# Patient Record
Sex: Male | Born: 1950 | Race: White | Hispanic: No | Marital: Married | Smoking: Former smoker
Health system: Southern US, Community
[De-identification: ages and names within clinical notes are randomized; demographics above are authoritative.]

## PROBLEM LIST (undated history)

## (undated) DIAGNOSIS — C61 Malignant neoplasm of prostate: Secondary | ICD-10-CM

## (undated) DIAGNOSIS — I10 Essential (primary) hypertension: Secondary | ICD-10-CM

## (undated) DIAGNOSIS — N403 Nodular prostate with lower urinary tract symptoms: Secondary | ICD-10-CM

## (undated) DIAGNOSIS — Z973 Presence of spectacles and contact lenses: Secondary | ICD-10-CM

---
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
## 2011-10-26 LAB — HM COLONOSCOPY

## 2014-10-31 DIAGNOSIS — I10 Essential (primary) hypertension: Secondary | ICD-10-CM | POA: Diagnosis not present

## 2015-03-16 HISTORY — PX: COLONOSCOPY WITH PROPOFOL: SHX5780

## 2016-01-13 DIAGNOSIS — H2513 Age-related nuclear cataract, bilateral: Secondary | ICD-10-CM | POA: Diagnosis not present

## 2016-08-12 ENCOUNTER — Ambulatory Visit (INDEPENDENT_AMBULATORY_CARE_PROVIDER_SITE_OTHER): Payer: Medicare Other | Admitting: Podiatry

## 2016-08-12 ENCOUNTER — Encounter: Payer: Self-pay | Admitting: Podiatry

## 2016-08-12 DIAGNOSIS — L600 Ingrowing nail: Secondary | ICD-10-CM

## 2016-08-12 MED ORDER — NEOMYCIN-POLYMYXIN-HC 1 % OT SOLN: OTIC | 1 refills | Status: DC

## 2016-08-12 NOTE — Patient Instructions (Signed)

## 2016-08-12 NOTE — Progress Notes (Signed)
   Subjective:    Patient ID: Ethan [name], male    DOB: 01-Jul-1950, 66 y.o.   MRN: [id number]  HPI: He presents today with chief complaint of ingrown toenails to the hallux bilateral. He states they been present for several months red tight swollen and painful shoe gear.    Review of Systems  Musculoskeletal: Positive for arthralgias.  All other systems reviewed and are negative.      Objective:   Physical Exam: Vital signs are stable alert and oriented 3 pulses are strongly palpable. Neurologic sensorium is intact deep tendon reflexes are intact muscle strength is normal bilateral. Orthopedic evaluation of his result was distal to the ankle for range of motion without crepitation. Cutaneous evaluation of straight supple well-hydrated cutis sharply greater nail margins with granulomas to the tibial borders of the hallux bilateral left greater than right.        Assessment & Plan:  Ingrown nail paronychia abscess hallux bilateral.  Plan: Chemical matrixectomy was performed to the hallux bilateral. He tolerated this well a local anesthesia was administered. He was provided with a prescription for Cortisporin otic as well as both oral home-going instructions for care and soaking of his toe and application of the antibiotic solution twice daily after soaking. I will follow-up with him in 2 weeks.

## 2016-08-25 ENCOUNTER — Ambulatory Visit: Payer: Self-pay | Admitting: Podiatry

## 2016-08-25 ENCOUNTER — Ambulatory Visit (INDEPENDENT_AMBULATORY_CARE_PROVIDER_SITE_OTHER): Payer: Medicare Other | Admitting: Podiatry

## 2016-08-25 ENCOUNTER — Encounter: Payer: Self-pay | Admitting: Podiatry

## 2016-08-25 DIAGNOSIS — Z23 Encounter for immunization: Secondary | ICD-10-CM | POA: Diagnosis not present

## 2016-08-25 DIAGNOSIS — I10 Essential (primary) hypertension: Secondary | ICD-10-CM | POA: Diagnosis not present

## 2016-08-25 DIAGNOSIS — L600 Ingrowing nail: Secondary | ICD-10-CM

## 2016-08-25 NOTE — Progress Notes (Signed)
Subjective:    Patient ID: Ethan [name], male   DOB: 66 y.o.   MRN: [id number]   HPI patient states nails doing fine with slight thickening the second nail right    ROS      Objective:  Physical Exam doing well from ingrown correction bilateral with thickened second nail right     Assessment:    Continue soaking toe drainage is ended     Plan:     Discharge and less needs to be seen again we'll probably have the second nail removed at one point in future

## 2016-09-30 DIAGNOSIS — L218 Other seborrheic dermatitis: Secondary | ICD-10-CM | POA: Diagnosis not present

## 2016-09-30 DIAGNOSIS — L821 Other seborrheic keratosis: Secondary | ICD-10-CM | POA: Diagnosis not present

## 2016-11-05 DIAGNOSIS — I10 Essential (primary) hypertension: Secondary | ICD-10-CM | POA: Diagnosis not present

## 2016-12-23 DIAGNOSIS — Z23 Encounter for immunization: Secondary | ICD-10-CM | POA: Diagnosis not present

## 2017-08-23 DIAGNOSIS — N400 Enlarged prostate without lower urinary tract symptoms: Secondary | ICD-10-CM | POA: Diagnosis not present

## 2017-08-23 DIAGNOSIS — N403 Nodular prostate with lower urinary tract symptoms: Secondary | ICD-10-CM | POA: Diagnosis not present

## 2017-08-24 DIAGNOSIS — R972 Elevated prostate specific antigen [PSA]: Secondary | ICD-10-CM | POA: Diagnosis not present

## 2017-08-24 DIAGNOSIS — N403 Nodular prostate with lower urinary tract symptoms: Secondary | ICD-10-CM | POA: Diagnosis not present

## 2017-08-24 DIAGNOSIS — R350 Frequency of micturition: Secondary | ICD-10-CM | POA: Diagnosis not present

## 2017-08-25 ENCOUNTER — Other Ambulatory Visit: Payer: Self-pay | Admitting: Urology

## 2017-08-25 DIAGNOSIS — R972 Elevated prostate specific antigen [PSA]: Secondary | ICD-10-CM | POA: Diagnosis not present

## 2017-08-25 DIAGNOSIS — C61 Malignant neoplasm of prostate: Secondary | ICD-10-CM | POA: Diagnosis not present

## 2017-09-02 ENCOUNTER — Encounter (HOSPITAL_COMMUNITY)
Admission: RE | Admit: 2017-09-02 | Discharge: 2017-09-02 | Disposition: A | Discharge status: Home / Self Care | Payer: Medicare Other | Source: Ambulatory Visit | Attending: Urology | Admitting: Urology

## 2017-09-02 DIAGNOSIS — N400 Enlarged prostate without lower urinary tract symptoms: Secondary | ICD-10-CM | POA: Diagnosis not present

## 2017-09-02 DIAGNOSIS — R972 Elevated prostate specific antigen [PSA]: Secondary | ICD-10-CM | POA: Diagnosis not present

## 2017-09-02 DIAGNOSIS — C61 Malignant neoplasm of prostate: Secondary | ICD-10-CM | POA: Diagnosis not present

## 2017-09-02 MED ORDER — FLUDEOXYGLUCOSE F - 18 (FDG) INJECTION: 10.3200 | Freq: Once | INTRAVENOUS | Status: DC | PRN

## 2017-09-02 MED ORDER — TECHNETIUM TC 99M MEDRONATE IV KIT
20.0000 | PACK | Freq: Once | INTRAVENOUS | Status: AC | PRN
  Administered 2017-09-02: 21.4 via INTRAVENOUS

## 2017-09-07 ENCOUNTER — Other Ambulatory Visit: Payer: Self-pay

## 2017-09-07 ENCOUNTER — Encounter (HOSPITAL_BASED_OUTPATIENT_CLINIC_OR_DEPARTMENT_OTHER): Payer: Self-pay | Admitting: *Deleted

## 2017-09-07 ENCOUNTER — Other Ambulatory Visit: Payer: Self-pay | Admitting: Urology

## 2017-09-07 DIAGNOSIS — C7951 Secondary malignant neoplasm of bone: Secondary | ICD-10-CM | POA: Diagnosis not present

## 2017-09-07 DIAGNOSIS — R3912 Poor urinary stream: Secondary | ICD-10-CM | POA: Diagnosis not present

## 2017-09-07 DIAGNOSIS — R351 Nocturia: Secondary | ICD-10-CM | POA: Diagnosis not present

## 2017-09-07 DIAGNOSIS — C61 Malignant neoplasm of prostate: Secondary | ICD-10-CM | POA: Diagnosis not present

## 2017-09-07 DIAGNOSIS — N403 Nodular prostate with lower urinary tract symptoms: Secondary | ICD-10-CM | POA: Diagnosis not present

## 2017-09-07 DIAGNOSIS — C775 Secondary and unspecified malignant neoplasm of intrapelvic lymph nodes: Secondary | ICD-10-CM | POA: Diagnosis not present

## 2017-09-07 NOTE — H&P (Signed)
CC: My PSA is elevated above the normal range.  HPI: Ethan [name] is a 67 year-old male patient who was referred by Dr. Irine Seal, MD who is here for an elevated PSA.  His PSA is 542.   Ethan [name] is a 67 yo WM who is sent in consultation by Dr. Evette Georges for a nodular prostate with LUTS and a PSA of 542. He had a PSA about 5 years ago that was 2.21 on 10/25/11. He has had increased LUTS over the last 3-6 months. He has some intermittency, frequency, a reduced stream and nocturia. His IPSS is 13. He was given tamsulosin which he began last night. He has no longer has AM erections and has a 3-4 month history of reduced ejaculatory volume. He has otherwise normal erections. He lifted something heavy about 3-4 months ago and had acute pain into both legs and into the penis. The penile pain can resolve at times. He has had no weight loss or bone pain. He does have some muscular pain in the left leg that has been present for the last 6 months. He has no other GU history. He has no family history of prostate cancer. He has had no hematuria.      ALLERGIES: None   MEDICATIONS: Aspir 81  Flomax  Losartan Potassium     GU PSH: Prostate Needle Biopsy - 08/25/2017    NON-GU PSH: Appendectomy (laparoscopic) - 1964 Surgical Pathology, Gross And Microscopic Examination For Prostate Needle - 08/25/2017    GU PMH: Elevated PSA, His repeat PSA was 352 and his US findings are consistent with extracapsular disease. I will contact him with the results and have him return for a consultation with the results of his CT and Bonescan which are scheduled for 6/21. 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 - 08/25/2017 Hypertension    FAMILY HISTORY: 2 sons - Son Deceased - Mother, Father Dementia - Mother Kidney Failure - Father   SOCIAL HISTORY: Marital Status: Married Preferred Language: English; Ethnicity: Not Hispanic Or Latino; Race: White Current Smoking Status:  Patient does not smoke anymore. Has not smoked since 08/14/2006. Smoked for 25 years. Smoked 1 pack per day.   Tobacco Use Assessment Completed: Used Tobacco in last 30 days? Drinks 1 drink per day.  Drinks 1 caffeinated drink per day. Patient's occupation Lobbyist.    REVIEW OF SYSTEMS:    GU Review Male:   Patient reports frequent urination, get up at night to urinate, leakage of urine, stream starts and stops, and erection problems. Patient denies hard to postpone urination, burning/ pain with urination, trouble starting your stream, have to strain to urinate , and penile pain.  Gastrointestinal (Upper):   Patient denies nausea, vomiting, and indigestion/ heartburn.  Gastrointestinal (Lower):   Patient denies diarrhea and constipation.  Constitutional:   Patient denies fever, night sweats, weight loss, and fatigue.  Skin:   Patient denies skin rash/ lesion and itching.  Eyes:   Patient denies blurred vision and double vision.  Ears/ Nose/ Throat:   Patient denies sore throat and sinus problems.  Hematologic/Lymphatic:   Patient denies swollen glands and easy bruising.  Cardiovascular:   Patient denies leg swelling and chest pains.  Respiratory:   Patient denies cough and shortness of breath.  Endocrine:   Patient denies excessive thirst.  Musculoskeletal:   Patient denies back pain and joint pain.  Neurological:   Patient denies  dizziness and headaches.  Psychologic:   Patient denies depression and anxiety.   VITAL SIGNS:      08/24/2017 03:54 PM  Weight 204.5 lb / 92.76 kg  Height 70 in / 177.8 cm  BP 152/83 mmHg  Pulse 86 /min  Temperature 97.3 F / 36.2 C  BMI 29.3 kg/m   GU PHYSICAL EXAMINATION:    Anus and Perineum: No hemorrhoids. No anal stenosis. No rectal fissure, no anal fissure. No edema, no dimple, no perineal tenderness, no anal tenderness.  Scrotum: No lesions. No edema. No cysts. No warts.  Epididymides: Right: no spermatocele, no masses, no cysts,  no tenderness, no induration, no enlargement. Left: no spermatocele, no masses, no cysts, no tenderness, no induration, no enlargement.  Testes: No tenderness, no swelling, no enlargement left testes. No tenderness, no swelling, no enlargement right testes. Normal location left testes. Normal location right testes. No mass, no cyst, no varicocele, no hydrocele left testes. No mass, no cyst, no varicocele, no hydrocele right testes.  Urethral Meatus: Normal size. No lesion, no wart, no discharge, no polyp. Normal location.  Penis: Circumcised, no warts, no cracks. No dorsal Peyronie's plaques, no left corporal Peyronie's plaques, no right corporal Peyronie's plaques, no scarring, no warts. No balanitis, no meatal stenosis.  Prostate: Prostate 2 + size irregular and firm . There is a mass effect superior to the prostate that could be a full bladder on neoplasm.   Seminal Vesicles: Nonpalpable.  Sphincter Tone: Normal sphincter. No rectal tenderness. No rectal mass.    MULTI-SYSTEM PHYSICAL EXAMINATION:    Constitutional: Well-nourished. No physical deformities. Normally developed. Good grooming.  Neck: Neck symmetrical, not swollen. Normal tracheal position.  Respiratory: No labored breathing, no use of accessory muscles. Normal breath sounds.  Cardiovascular: Regular rate and rhythm. No murmur, no gallop. Normal temperature, normal extremity pulses, no swelling, no varicosities.  Lymphatic: No enlargement of neck, axillae, groin.  Skin: No paleness, no jaundice, no cyanosis. No lesion, no ulcer, no rash.  Neurologic / Psychiatric: Oriented to time, oriented to place, oriented to person. No depression, no anxiety, no agitation.  Gastrointestinal: Obese abdomen. No hernia. No mass, no tenderness, no rigidity.   Musculoskeletal: Normal gait and station of head and neck.     PAST DATA REVIEWED:  Source Of History:  Patient  Lab Test Review:   PSA, BMP  Records Review:   AUA Symptom Score, Previous  Doctor Records  Urine Test Review:   Urinalysis  Urodynamics Review:   Review Bladder Scan  Notes:                     I have reviewed his records from Dr. Zadie Rhine. Cr on 6/12 was 0.92 and his LFT's were normal.    PROCEDURES:           PVR Ultrasound - 75102  Scanned Volume: 67 cc         Urinalysis w/Scope - 81001 Dipstick Dipstick Cont'd Micro  Color: Yellow Bilirubin: Neg WBC/hpf: NS (Not Seen)  Appearance: Clear Ketones: Trace RBC/hpf: 0 - 2/hpf  Specific Gravity: 1.030 Blood: 1+ Bacteria: NS (Not Seen)  pH: 5.5 Protein: 1+ Cystals: NS (Not Seen)  Glucose: Neg Urobilinogen: 0.2 Casts: NS (Not Seen)    Nitrites: Neg Trichomonas: Not Present    Leukocyte Esterase: Neg Mucous: Present      Epithelial Cells: NS (Not Seen)      Yeast: NS (Not Seen)      Sperm: Not  Present    Notes:      ASSESSMENT:      ICD-10 Details  1 GU:   Elevated PSA - R97.20 He has a PSA of 543 with a nodular prostate with a mass superior to the prostate and LUTS. He most likely will be found to have metastatic or at least very locally advanced prostate cancer. I am going to get him back for a prostate [place] and biopsy tomorrow and reviewed the risks of bleeding, infection and voiding difficulty. I will also repeat a PSA today and get a testosterone level for baseline as he will likely need ADT. I have placed orders for a bone scan and CT pelvis.   2   Prostate nodule w/ LUTS - N40.3    PLAN:           Orders Labs PSA, Total Testosterone          Schedule X-Rays: Bone Scan - ASAP PSA of 542    C.T. Pelvis With I.V. Contrast - ASAP. He has a PSA of 542 with probable advanced prostate cancer.   Return Visit/Planned Activity: ASAP - TRUSP             Note: He has a PSA of 542 and I would like to squeeze in the biopsy tomorrow.   Procedure: Approximately 1 Day at [facility] [place]. - [phone] - TRUSP/BX (Prostate Needle Biopsy) - [place], [id number], [id number], [id number], [phone] Notes: Needs Biopsy ASAP.           Document Letter(s):  Created for Patient: Clinical Summary         Notes:   CC: Dr. Harlene Ramus.         Next Appointment:      Next Appointment: 09/02/2017 01:15 PM    Appointment Type: CT Scan    Location: [facility] [phone]    Provider: CT CT    Reason for Visit: ct p w/Malaiyah Achorn Elevated PSA     His biopsy showed extensive Gleason 9 prostate cancer and staging showed extensive bone and lymph node mets.   He has bone pain in the left leg and buttock.  After reviewing the options.   He has elected to proceed with bilateral orchiectomy as initial therapy.

## 2017-09-07 NOTE — Progress Notes (Signed)
Spoke w/ pt via phone for pre-op interview.  Npo after mn w/ exception clear liquids until 0645 (no cream/ milk products).  Arrive at 1045.  Needs istat 8 and ekg.

## 2017-09-08 ENCOUNTER — Other Ambulatory Visit: Payer: Self-pay

## 2017-09-08 ENCOUNTER — Encounter (HOSPITAL_BASED_OUTPATIENT_CLINIC_OR_DEPARTMENT_OTHER): Payer: Self-pay

## 2017-09-08 ENCOUNTER — Ambulatory Visit (HOSPITAL_BASED_OUTPATIENT_CLINIC_OR_DEPARTMENT_OTHER)
Admission: RE | Admit: 2017-09-08 | Discharge: 2017-09-08 | Disposition: A | Discharge status: Home / Self Care | Payer: Medicare Other | Source: Ambulatory Visit | Attending: Urology | Admitting: Urology

## 2017-09-08 ENCOUNTER — Encounter (HOSPITAL_BASED_OUTPATIENT_CLINIC_OR_DEPARTMENT_OTHER)
Admission: RE | Disposition: A | Discharge status: Home / Self Care | Payer: Self-pay | Source: Ambulatory Visit | Attending: Urology

## 2017-09-08 ENCOUNTER — Ambulatory Visit (HOSPITAL_BASED_OUTPATIENT_CLINIC_OR_DEPARTMENT_OTHER): Payer: Medicare Other | Admitting: Anesthesiology

## 2017-09-08 DIAGNOSIS — C779 Secondary and unspecified malignant neoplasm of lymph node, unspecified: Secondary | ICD-10-CM | POA: Insufficient documentation

## 2017-09-08 DIAGNOSIS — I10 Essential (primary) hypertension: Secondary | ICD-10-CM | POA: Diagnosis not present

## 2017-09-08 DIAGNOSIS — N4611 Organic oligospermia: Secondary | ICD-10-CM | POA: Diagnosis not present

## 2017-09-08 DIAGNOSIS — Z87891 Personal history of nicotine dependence: Secondary | ICD-10-CM | POA: Insufficient documentation

## 2017-09-08 DIAGNOSIS — Z79899 Other long term (current) drug therapy: Secondary | ICD-10-CM | POA: Diagnosis not present

## 2017-09-08 DIAGNOSIS — C7951 Secondary malignant neoplasm of bone: Secondary | ICD-10-CM | POA: Diagnosis not present

## 2017-09-08 DIAGNOSIS — Z7982 Long term (current) use of aspirin: Secondary | ICD-10-CM | POA: Diagnosis not present

## 2017-09-08 DIAGNOSIS — C61 Malignant neoplasm of prostate: Secondary | ICD-10-CM | POA: Diagnosis not present

## 2017-09-08 HISTORY — DX: Malignant neoplasm of prostate: C61

## 2017-09-08 HISTORY — DX: Essential (primary) hypertension: I10

## 2017-09-08 HISTORY — PX: ORCHIECTOMY: SHX2116

## 2017-09-08 HISTORY — DX: Presence of spectacles and contact lenses: Z97.3

## 2017-09-08 HISTORY — DX: Nodular prostate with lower urinary tract symptoms: N40.3

## 2017-09-08 LAB — POCT I-STAT, CHEM 8
BUN: 20 mg/dL (ref 8–23)
CREATININE: 0.7 mg/dL (ref 0.61–1.24)
Calcium, Ion: 1.19 mmol/L (ref 1.15–1.40)
Chloride: 101 mmol/L (ref 98–111)
Glucose, Bld: 109 mg/dL — ABNORMAL HIGH (ref 70–99)
HEMATOCRIT: 42 % (ref 39.0–52.0)
Hemoglobin: 14.3 g/dL (ref 13.0–17.0)
POTASSIUM: 3.6 mmol/L (ref 3.5–5.1)
Sodium: 140 mmol/L (ref 135–145)
TCO2: 24 mmol/L (ref 22–32)

## 2017-09-08 SURGERY — ORCHIECTOMY
Anesthesia: General | Laterality: Bilateral

## 2017-09-08 MED ORDER — LIDOCAINE 2% (20 MG/ML) 5 ML SYRINGE
INTRAMUSCULAR | Status: DC | PRN
  Administered 2017-09-08: 75 mg via INTRAVENOUS

## 2017-09-08 MED ORDER — CELECOXIB 200 MG PO CAPS
ORAL_CAPSULE | ORAL | Status: AC
  Filled 2017-09-08: qty 1

## 2017-09-08 MED ORDER — FENTANYL CITRATE (PF) 100 MCG/2ML IJ SOLN
INTRAMUSCULAR | Status: AC
  Filled 2017-09-08: qty 2

## 2017-09-08 MED ORDER — BUPIVACAINE HCL (PF) 0.25 % IJ SOLN
INTRAMUSCULAR | Status: DC | PRN
  Administered 2017-09-08: 17 mL

## 2017-09-08 MED ORDER — OXYCODONE HCL 5 MG PO TABS
5.0000 mg | ORAL_TABLET | Freq: Once | ORAL | Status: DC | PRN
  Filled 2017-09-08: qty 1

## 2017-09-08 MED ORDER — HYDROMORPHONE HCL 1 MG/ML IJ SOLN
0.2500 mg | INTRAMUSCULAR | Status: DC | PRN
  Filled 2017-09-08: qty 0.5

## 2017-09-08 MED ORDER — SODIUM CHLORIDE 0.9% FLUSH
3.0000 mL | INTRAVENOUS | Status: DC | PRN
  Filled 2017-09-08: qty 3

## 2017-09-08 MED ORDER — SODIUM CHLORIDE 0.9 % IR SOLN
Status: DC | PRN
  Administered 2017-09-08: 500 mL

## 2017-09-08 MED ORDER — OXYCODONE HCL 5 MG PO TABS
5.0000 mg | ORAL_TABLET | ORAL | Status: DC | PRN
  Filled 2017-09-08: qty 2

## 2017-09-08 MED ORDER — MORPHINE SULFATE (PF) 2 MG/ML IV SOLN
2.0000 mg | INTRAVENOUS | Status: DC | PRN
  Filled 2017-09-08: qty 1

## 2017-09-08 MED ORDER — ACETAMINOPHEN 325 MG PO TABS
650.0000 mg | ORAL_TABLET | ORAL | Status: DC | PRN
  Filled 2017-09-08: qty 2

## 2017-09-08 MED ORDER — SODIUM CHLORIDE 0.9% FLUSH
3.0000 mL | Freq: Two times a day (BID) | INTRAVENOUS | Status: DC
  Filled 2017-09-08: qty 3

## 2017-09-08 MED ORDER — FENTANYL CITRATE (PF) 100 MCG/2ML IJ SOLN
INTRAMUSCULAR | Status: DC | PRN
  Administered 2017-09-08 (×2): 50 ug via INTRAVENOUS

## 2017-09-08 MED ORDER — HYDROCODONE-ACETAMINOPHEN 5-325 MG PO TABS: 1.0000 | ORAL_TABLET | Freq: Four times a day (QID) | ORAL | 0 refills | Status: DC | PRN

## 2017-09-08 MED ORDER — PROPOFOL 10 MG/ML IV BOLUS
INTRAVENOUS | Status: AC
  Filled 2017-09-08: qty 40

## 2017-09-08 MED ORDER — OXYCODONE HCL 5 MG/5ML PO SOLN
5.0000 mg | Freq: Once | ORAL | Status: DC | PRN
  Filled 2017-09-08: qty 5

## 2017-09-08 MED ORDER — MIDAZOLAM HCL 2 MG/2ML IJ SOLN
INTRAMUSCULAR | Status: AC
  Filled 2017-09-08: qty 2

## 2017-09-08 MED ORDER — ACETAMINOPHEN 160 MG/5ML PO SOLN
960.0000 mg | Freq: Once | ORAL | Status: AC
  Filled 2017-09-08: qty 40.6

## 2017-09-08 MED ORDER — DEXAMETHASONE SODIUM PHOSPHATE 4 MG/ML IJ SOLN
INTRAMUSCULAR | Status: DC | PRN
  Administered 2017-09-08: 10 mg via INTRAVENOUS

## 2017-09-08 MED ORDER — LACTATED RINGERS IV SOLN
INTRAVENOUS | Status: DC
  Administered 2017-09-08: 11:00:00 via INTRAVENOUS
  Administered 2017-09-08: 1000 mL via INTRAVENOUS
  Filled 2017-09-08: qty 1000

## 2017-09-08 MED ORDER — ACETAMINOPHEN 500 MG PO TABS
1000.0000 mg | ORAL_TABLET | Freq: Once | ORAL | Status: AC
  Administered 2017-09-08: 1000 mg via ORAL
  Filled 2017-09-08: qty 2

## 2017-09-08 MED ORDER — CELECOXIB 200 MG PO CAPS
200.0000 mg | ORAL_CAPSULE | Freq: Once | ORAL | Status: AC | PRN
  Administered 2017-09-08: 200 mg via ORAL
  Filled 2017-09-08: qty 1

## 2017-09-08 MED ORDER — ACETAMINOPHEN 500 MG PO TABS
ORAL_TABLET | ORAL | Status: AC
  Filled 2017-09-08: qty 2

## 2017-09-08 MED ORDER — ONDANSETRON HCL 4 MG/2ML IJ SOLN
INTRAMUSCULAR | Status: DC | PRN
  Administered 2017-09-08: 4 mg via INTRAVENOUS

## 2017-09-08 MED ORDER — MIDAZOLAM HCL 5 MG/5ML IJ SOLN
INTRAMUSCULAR | Status: DC | PRN
  Administered 2017-09-08: 2 mg via INTRAVENOUS

## 2017-09-08 MED ORDER — PROPOFOL 10 MG/ML IV BOLUS
INTRAVENOUS | Status: DC | PRN
  Administered 2017-09-08: 200 mg via INTRAVENOUS

## 2017-09-08 MED ORDER — CEFAZOLIN SODIUM-DEXTROSE 2-4 GM/100ML-% IV SOLN
2.0000 g | INTRAVENOUS | Status: AC
  Administered 2017-09-08: 2 g via INTRAVENOUS
  Filled 2017-09-08: qty 100

## 2017-09-08 MED ORDER — ACETAMINOPHEN 650 MG RE SUPP
650.0000 mg | RECTAL | Status: DC | PRN
  Filled 2017-09-08: qty 1

## 2017-09-08 MED ORDER — CEFAZOLIN SODIUM-DEXTROSE 2-4 GM/100ML-% IV SOLN
INTRAVENOUS | Status: AC
  Filled 2017-09-08: qty 100

## 2017-09-08 MED ORDER — SODIUM CHLORIDE 0.9 % IV SOLN
250.0000 mL | INTRAVENOUS | Status: DC | PRN
  Filled 2017-09-08: qty 250

## 2017-09-08 SURGICAL SUPPLY — 47 items
APPLICATOR COTTON TIP 6IN STRL (MISCELLANEOUS) IMPLANT
BLADE CLIPPER SURG (BLADE) ×3 IMPLANT
BLADE SURG 15 STRL LF DISP TIS (BLADE) ×1 IMPLANT
BLADE SURG 15 STRL SS (BLADE) ×2
BNDG GAUZE ELAST 4 BULKY (GAUZE/BANDAGES/DRESSINGS) ×3 IMPLANT
CANISTER SUCTION 1200CC (MISCELLANEOUS) ×3 IMPLANT
CLEANER CAUTERY TIP 5X5 PAD (MISCELLANEOUS) ×1 IMPLANT
COVER BACK TABLE 60X90IN (DRAPES) ×3 IMPLANT
COVER MAYO STAND STRL (DRAPES) ×3 IMPLANT
DERMABOND ADVANCED (GAUZE/BANDAGES/DRESSINGS) ×2
DERMABOND ADVANCED .7 DNX12 (GAUZE/BANDAGES/DRESSINGS) ×1 IMPLANT
DISSECTOR ROUND CHERRY 3/8 STR (MISCELLANEOUS) IMPLANT
DRAIN PENROSE 18X1/4 LTX STRL (WOUND CARE) IMPLANT
DRAPE LAPAROTOMY TRNSV 102X78 (DRAPE) ×3 IMPLANT
DRSG TEGADERM 4X4.75 (GAUZE/BANDAGES/DRESSINGS) IMPLANT
ELECT NEEDLE TIP 2.8 STRL (NEEDLE) IMPLANT
ELECT REM PT RETURN 9FT ADLT (ELECTROSURGICAL) ×3
ELECTRODE REM PT RTRN 9FT ADLT (ELECTROSURGICAL) ×1 IMPLANT
GAUZE SPONGE 4X4 12PLY STRL (GAUZE/BANDAGES/DRESSINGS) ×3 IMPLANT
GLOVE SURG SS PI 8.0 STRL IVOR (GLOVE) ×3 IMPLANT
GOWN STRL REUS W/TWL LRG LVL3 (GOWN DISPOSABLE) IMPLANT
GOWN STRL REUS W/TWL XL LVL3 (GOWN DISPOSABLE) ×3 IMPLANT
KIT TURNOVER CYSTO (KITS) ×3 IMPLANT
NEEDLE HYPO 22GX1.5 SAFETY (NEEDLE) ×3 IMPLANT
NS IRRIG 500ML POUR BTL (IV SOLUTION) IMPLANT
PACK BASIN DAY SURGERY FS (CUSTOM PROCEDURE TRAY) ×3 IMPLANT
PAD CLEANER CAUTERY TIP 5X5 (MISCELLANEOUS) ×2
PENCIL BUTTON HOLSTER BLD 10FT (ELECTRODE) ×3 IMPLANT
SET COLLECT BLD 21X3/4 12 (NEEDLE) IMPLANT
SUPPORT SCROTAL LG STRP (MISCELLANEOUS) IMPLANT
SUPPORTER ATHLETIC LG (MISCELLANEOUS)
SUT CHROMIC 3 0 SH 27 (SUTURE) ×3 IMPLANT
SUT CHROMIC GUT AB #0 18 (SUTURE) IMPLANT
SUT SILK 3 0 TIES 17X18 (SUTURE)
SUT SILK 3-0 18XBRD TIE BLK (SUTURE) IMPLANT
SUT VICRYL 0 TIES 12 18 (SUTURE) ×3 IMPLANT
SUT VICRYL 2 0 18  UND BR (SUTURE)
SUT VICRYL 2 0 18 UND BR (SUTURE) IMPLANT
SYR 20CC LL (SYRINGE) ×3 IMPLANT
SYR BULB IRRIGATION 50ML (SYRINGE) ×3 IMPLANT
SYR CONTROL 10ML LL (SYRINGE) ×3 IMPLANT
TOWEL OR 17X24 6PK STRL BLUE (TOWEL DISPOSABLE) ×6 IMPLANT
TRAY DSU PREP LF (CUSTOM PROCEDURE TRAY) ×3 IMPLANT
TUBE CONNECTING 12'X1/4 (SUCTIONS) ×1
TUBE CONNECTING 12X1/4 (SUCTIONS) ×2 IMPLANT
WATER STERILE IRR 500ML POUR (IV SOLUTION) IMPLANT
YANKAUER SUCT BULB TIP NO VENT (SUCTIONS) ×3 IMPLANT

## 2017-09-08 NOTE — Discharge Instructions (Addendum)
HOME CARE INSTRUCTIONS FOR SCROTAL PROCEDURES  Wound Care & Hygiene: You may apply an ice bag to the scrotum for the first 24 hours.  This may help decrease swelling and soreness.  You may have a dressing held in place by an athletic supporter.  You may remove the dressing in 24 hours and shower in 48 hours.  Continue to use the athletic supporter or tight briefs for at least a week. Activity: Rest today - not necessarily flat bed rest.  Just take it easy.  You should not do strenuous activities until your follow-up visit with your doctor.  You may resume light activity in 48 hours.  Return to Work:  Your doctor will advise you of this depending on the type of work you do  Diet: Drink liquids or eat a light diet this evening.  You may resume a regular diet tomorrow.  General Expectations: You may have a small amount of bleeding.  The scrotum may be swollen or bruised for about a week.  Call your Doctor if these occur:  -persistent or heavy bleeding  -temperature of 101 degrees or more  -severe pain, not relieved by your pain medication  You may use and ice pack wrapped in a towel for 4-6 hours today to reduce swelling.  Please begin taking Caltrate + D  1200mg  daily.    Do not take any Tylenol or nonsteroidal anti inflammatories (Ibuprofen, Aleve, Motrin) until after 5:24 pm today.  We will arrange your follow up appointment and we are setting up a consultation with the medical oncologist.    Patient Signature:  __________________________________________________  Nurse's Signature:  __________________________________________________     Post Anesthesia Home Care Instructions  Activity: Get plenty of rest for the remainder of the day. A responsible individual must stay with you for 24 hours following the procedure.  For the next 24 hours, DO NOT: -Drive a car -Paediatric nurse -Drink alcoholic beverages -Take any medication unless instructed by your physician -Make any  legal decisions or sign important papers.  Meals: Start with liquid foods such as gelatin or soup. Progress to regular foods as tolerated. Avoid greasy, spicy, heavy foods. If nausea and/or vomiting occur, drink only clear liquids until the nausea and/or vomiting subsides. Call your physician if vomiting continues.  Special Instructions/Symptoms: Your throat may feel dry or sore from the anesthesia or the breathing tube placed in your throat during surgery. If this causes discomfort, gargle with warm salt water. The discomfort should disappear within 24 hours.

## 2017-09-08 NOTE — Transfer of Care (Signed)
Immediate Anesthesia Transfer of Care Note  Patient: Ethan [name]  Procedure(s) Performed: ORCHIECTOMY (Bilateral )  Patient Location: PACU  Anesthesia Type:General  Level of Consciousness: awake, alert , oriented and patient cooperative  Airway & Oxygen Therapy: Patient Spontanous Breathing and Patient connected to nasal cannula oxygen  Post-op Assessment: Report given to RN and Post -op Vital signs reviewed and stable  Post vital signs: Reviewed and stable  Last Vitals:  Vitals Value Taken Time  BP    Temp 36.8 C 09/08/2017  1:51 PM  Pulse 78 09/08/2017  1:53 PM  Resp 7 09/08/2017  1:53 PM  SpO2 99 % 09/08/2017  1:53 PM  Vitals shown include unvalidated device data.  Last Pain:  Vitals:   09/08/17 1108  TempSrc:   PainSc: 0-No pain      Patients Stated Pain Goal: 7 (19/16/60 6004)  Complications: No apparent anesthesia complications

## 2017-09-08 NOTE — Anesthesia Postprocedure Evaluation (Signed)
Anesthesia Post Note  Patient: Ethan [name]  Procedure(s) Performed: ORCHIECTOMY (Bilateral )     Patient location during evaluation: PACU Anesthesia Type: General Level of consciousness: awake and alert Pain management: pain level controlled Vital Signs Assessment: post-procedure vital signs reviewed and stable Respiratory status: spontaneous breathing, nonlabored ventilation, respiratory function stable and patient connected to nasal cannula oxygen Cardiovascular status: blood pressure returned to baseline and stable Postop Assessment: no apparent nausea or vomiting Anesthetic complications: no    Last Vitals:  Vitals:   09/08/17 1430 09/08/17 1535  BP:  131/78  Pulse: 72 75  Resp: 11 14  Temp:  36.8 C  SpO2: 95% 98%    Last Pain:  Vitals:   09/08/17 1535  TempSrc: Oral  PainSc: 0-No pain                 Ryan P Ellender

## 2017-09-08 NOTE — Anesthesia Procedure Notes (Signed)
Procedure Name: LMA Insertion Date/Time: 09/08/2017 12:56 PM Performed by: Wanita Chamberlain, CRNA Pre-anesthesia Checklist: Timeout performed, Patient being monitored, Suction available, Emergency Drugs available and Patient identified Patient Re-evaluated:Patient Re-evaluated prior to induction Oxygen Delivery Method: Circle system utilized Preoxygenation: Pre-oxygenation with 100% oxygen Induction Type: IV induction Ventilation: Mask ventilation without difficulty LMA: LMA inserted LMA Size: 4.0 Number of attempts: 1 Airway Equipment and Method: Bite block Placement Confirmation: breath sounds checked- equal and bilateral,  CO2 detector and positive ETCO2 Tube secured with: Tape Dental Injury: Teeth and Oropharynx as per pre-operative assessment

## 2017-09-08 NOTE — Op Note (Signed)
Procedure: Bilateral scrotal orchiectomy.  Preop diagnosis: Metastatic prostate cancer.  Postop diagnosis: Same.  Surgeon: Dr. Irine Seal.  Anesthesia: General and local.  Specimen: Right and left testes for gross only.  Drains: None.  EBL: Minimal.  Complications: None.  Indications: Ethan [name] is a 67 year old male who was recently diagnosed with a Gleason's 9 metastatic prostate cancer with both bone and lymph node involvement along with locally advanced disease and a presenting PSA of 542.  He has bone pain in the left hip and upper leg from metastases.  After review of the options he is elected to proceed with bilateral orchiectomy for androgen deprivation.  Procedure: He was taken operating room where he was given 2 g of Ancef.  A general anesthetic was induced with him in the supine position.  He was fitted with PAS hose.  His scrotum was clipped and he was prepped with Betadine solution and draped in the usual sterile fashion.  A midline anterior scrotal incision was made with a knife and this was then carried down through the dartos using the Bovie.  The right testicle was delivered from the wound and the cord was skeletonized.  A cord block with 5 mL's of quarter percent Marcaine was performed.  The cord was then divided into 2 packets, each of which was divided removing the testicle and then each packet was doubly ligated with 0 Vicryl ties.  This procedure was then repeated in identical fashion on the left.  The wound was then inspected for hemostasis and no active bleeding was identified.  Scrotal incision was injected with 7 additional mL's of quarter percent Marcaine.  The dark toe was closed using a running 3-0 chromic suture with care taken to include the median septum.  The skin was then closed using a running vertical mattress 3-0 chromic suture.  The wound was cleansed and a dressing of 4 x 4's, fluffs Curlex and a scrotal support was placed.  His anesthetic was reversed and he  was moved to recovery room in stable condition.  There were no complications.  A total of 17 mL of quarter percent Marcaine were used.

## 2017-09-08 NOTE — Interval H&P Note (Signed)
History and Physical Interval Note:  09/08/2017 11:24 AM  Ethan [name]  has presented today for surgery, with the diagnosis of PROSTATE CANCER  The various methods of treatment have been discussed with the patient and family. After consideration of risks, benefits and other options for treatment, the patient has consented to  Procedure(s): ORCHIECTOMY (Bilateral) as a surgical intervention .  The patient's history has been reviewed, patient examined, no change in status, stable for surgery.  I have reviewed the patient's chart and labs.  Questions were answered to the patient's satisfaction.     Irine Seal

## 2017-09-08 NOTE — Anesthesia Preprocedure Evaluation (Addendum)
Anesthesia Evaluation  Patient identified by MRN, date of birth, ID band Patient awake    Reviewed: Allergy & Precautions, NPO status , Patient's Chart, lab work & pertinent test results  Airway Mallampati: II  TM Distance: >3 FB Neck ROM: Full    Dental no notable dental hx. (+) Teeth Intact, Dental Advisory Given   Pulmonary former smoker,    Pulmonary exam normal breath sounds clear to auscultation       Cardiovascular hypertension, Pt. on medications Normal cardiovascular exam Rhythm:Regular Rate:Normal  ECG: NSR, rate 72   Neuro/Psych negative neurological ROS  negative psych ROS   GI/Hepatic negative GI ROS, Neg liver ROS,   Endo/Other  negative endocrine ROS  Renal/GU negative Renal ROS     Musculoskeletal negative musculoskeletal ROS (+)   Abdominal   Peds  Hematology negative hematology ROS (+)   Anesthesia Other Findings PROSTATE CANCER  Reproductive/Obstetrics                          Anesthesia Physical Anesthesia Plan  ASA: II  Anesthesia Plan: General   Post-op Pain Management:    Induction: Intravenous  PONV Risk Score and Plan: 2 and Ondansetron, Dexamethasone, Midazolam and Treatment may vary due to age or medical condition  Airway Management Planned: LMA  Additional Equipment:   Intra-op Plan:   Post-operative Plan: Extubation in OR  Informed Consent: I have reviewed the patients History and Physical, chart, labs and discussed the procedure including the risks, benefits and alternatives for the proposed anesthesia with the patient or authorized representative who has indicated his/her understanding and acceptance.   Dental advisory given  Plan Discussed with: CRNA  Anesthesia Plan Comments:         Anesthesia Quick Evaluation

## 2017-09-09 ENCOUNTER — Encounter (HOSPITAL_BASED_OUTPATIENT_CLINIC_OR_DEPARTMENT_OTHER): Payer: Self-pay | Admitting: Urology

## 2017-09-14 DIAGNOSIS — C61 Malignant neoplasm of prostate: Secondary | ICD-10-CM | POA: Diagnosis not present

## 2017-09-19 ENCOUNTER — Telehealth: Payer: Self-pay | Admitting: Oncology

## 2017-09-19 NOTE — Telephone Encounter (Signed)
New referral received from Dr. Jeffie Pollock at [facility] for metastatic prostate cancer. Pt has been scheduled to see Dr. Alen Blew on 7/12 at 2pm. Pt aware to arrive 30 minutes early.

## 2017-09-20 ENCOUNTER — Telehealth: Payer: Self-pay

## 2017-09-20 NOTE — Telephone Encounter (Signed)
Received VM from pt requesting if he should come in for new patient paperwork to be completed prior to appt on Friday. Ethan [name], New Patient Scheduler to inquire. Pt does not need to fill out anything ahead of time, but should just come in 30 minutes prior to appt time on Friday to complete Registration. Called pt back who verbalized understanding of instructions.

## 2017-09-23 ENCOUNTER — Telehealth: Payer: Self-pay

## 2017-09-23 ENCOUNTER — Telehealth: Payer: Self-pay | Admitting: Pharmacist

## 2017-09-23 ENCOUNTER — Encounter: Payer: Self-pay | Admitting: Medical Oncology

## 2017-09-23 ENCOUNTER — Inpatient Hospital Stay: Payer: Medicare Other | Attending: Oncology | Admitting: Oncology

## 2017-09-23 ENCOUNTER — Encounter: Payer: Self-pay | Admitting: Oncology

## 2017-09-23 VITALS — BP 119/48 | HR 79 | Temp 98.4°F | Resp 18 | Ht 70.0 in | Wt 197.6 lb

## 2017-09-23 DIAGNOSIS — C7951 Secondary malignant neoplasm of bone: Secondary | ICD-10-CM | POA: Insufficient documentation

## 2017-09-23 DIAGNOSIS — Z9079 Acquired absence of other genital organ(s): Secondary | ICD-10-CM | POA: Diagnosis not present

## 2017-09-23 DIAGNOSIS — C61 Malignant neoplasm of prostate: Secondary | ICD-10-CM | POA: Insufficient documentation

## 2017-09-23 DIAGNOSIS — Z79899 Other long term (current) drug therapy: Secondary | ICD-10-CM | POA: Diagnosis not present

## 2017-09-23 DIAGNOSIS — I10 Essential (primary) hypertension: Secondary | ICD-10-CM | POA: Insufficient documentation

## 2017-09-23 MED ORDER — ABIRATERONE ACETATE 250 MG PO TABS: 1000.0000 mg | ORAL_TABLET | Freq: Every day | ORAL | 0 refills | Status: DC

## 2017-09-23 MED ORDER — PREDNISONE 5 MG PO TABS: 5.0000 mg | ORAL_TABLET | Freq: Every day | ORAL | 3 refills | Status: DC

## 2017-09-23 NOTE — Telephone Encounter (Signed)
Oral Oncology Pharmacist Encounter  Received new prescription for Zytiga (abiraterone) for the treatment of newly-diagnosed, metastatic, castration-sensitive prostate cancer in conjunction with prednisone, planned duration until disease progression or unacceptable ocxicity. S/p bilateral orchiectomy on 09/08/17 as form of androgen deprivation  Labs from Epic assessed, OK fr treatment. BP will be monitored, now WNL  Current medication list in Epic reviewed, no significant DDIs with Zytiga identified.  Prescription has been e-scribed to the [facility] for benefits analysis and approval.  Oral Oncology Clinic will continue to follow for insurance authorization, copayment issues, initial counseling and start date.  [name], PharmD, BCPS, BCOP  09/23/2017 3:18 PM Oral Oncology Clinic [phone]

## 2017-09-23 NOTE — Progress Notes (Signed)
Introduced myself to [name] and his wife as the prostate nurse navigator and my role. Dr. Shadad has prescribed Zytiga and he was provided with patient drug information. Jessie-oral chemo pharmacist met with him to discuss insurance coverage and discuss the drug. I gave them my business card and asked them to call with questions or concerns. 

## 2017-09-23 NOTE — Telephone Encounter (Signed)
Oral Chemotherapy Pharmacist Encounter  Successfully enrolled patient for copayment assistance funds from Patient [name] from the [place] amount: $7500 Effective dates: 06/25/17 - 09/23/18 ID: [id number] BIN: [id number] Group: [id number] PCN: [id number]  Billing information will be shared with dispensing pharmacy.  [name], PharmD, BCPS, BCOP 09/23/2017 3:31 PM Oral Oncology Clinic [phone]

## 2017-09-23 NOTE — Telephone Encounter (Signed)
Oral Chemotherapy Pharmacist Encounter   I spoke with patient and wife, Margarita Grizzle, in scheduling area for overview of: Zytiga.   Counseled patient on administration, dosing, side effects, monitoring, drug-food interactions, safe handling, storage, and disposal.  Patient will take Zytiga 250mg  tablets, 4 tablets (1000mg ) by mouth once daily on an empty stomach, 1 hour before or 2 hours after a meal.  Patient states he will take his Zyitga 1st thing in the morning and will wait at least 1 hour before eating.  Patient will take prednisone 5mg  tablet, 1 tablet by mouth one daily with breakfast.  Zytiga start date: TBD, pending medication acquisition  Adverse effects include but are not limited to: peripheral edema, GI upset, hypertension, hot flashes, fatigue, and arthralgias.    Prednisone prescription has been sent to [facility] on [address]. Patient will obtain prednisone and knows to start prednisone on the same day as Zytiga start.  Reviewed with patient importance of keeping a medication schedule and plan for any missed doses.  Mr. and Mrs. voiced understanding and appreciation.   All questions answered. Medication reconciliation performed and medication/allergy list updated.  We extensively discussed medicare copayment and possibility for high copayment. We discussed options for copayment assistance including copayment grant foundations and manufacturer compassionate use program. Patient shared income information for grant foundation screening.  We will follow-up with patient about insurance authorization, copayment, foundation assistance, and medication acquisition.  Patient knows to call the office with questions or concerns.  Oral Oncology Clinic will continue to follow.  Thank you,  [name], PharmD, BCPS, BCOP  09/23/2017 3:19 PM Oral Oncology Clinic [phone]

## 2017-09-23 NOTE — Telephone Encounter (Signed)
Oral Oncology Patient Advocate Encounter  Received notification from [facility]  that prior authorization for Ethan [name] is required.  PA submitted on CoverMyMeds Key [id number] Status is pending  Oral Oncology Clinic will continue to follow.  [place] Patient [place] Phone [phone] Fax [phone]

## 2017-09-23 NOTE — Telephone Encounter (Signed)
Printed avs and calender of upcoming appointment. Ethan D. Okayed injection to be before provider visit. Per 7/12 los

## 2017-09-23 NOTE — Progress Notes (Signed)
Reason for the request:   Prostate cancer  HPI: I was asked by Dr. Jeffie Pollock to evaluate Ethan [name] for prostate cancer.  He is a pleasant 67 year old man currently of [place].  He has history of hypertension without any significant comorbid conditions.  He was referred to Dr. Jeffie Pollock by his primary care provider after noticing increase in his lower urinary tract symptoms including frequency and reduced stream and nocturia.  He was also noted to have an elevated PSA of 542.  He was evaluated by Dr. Jeffie Pollock and a repeat PSA on 08/24/2017 was 352.  He underwent prostate biopsy obtained on August 25, 2017 showed high volume high-grade disease with a Gleason score 4+5 = 9 and multiple cores with a total of at least 11 out of 12 cores involved with cancer.  CT scan of the abdomen and pelvis obtained on 09/14/2017 confirmed the presence of sclerotic lesions in the ribs, T5 vertebral body and L4 vertebral body.  He also has suspected pathological fracture involving T5 vertebral body and inferior endplate of T4.  He had scattered retroperitoneal lymph nodes noted with enlargement of the right common iliac lymph node.  He subsequently underwent a bilateral orchiectomy obtained on 09/08/2017.  His lower urinary tract symptoms has improved and tamsulosin.  He has been complaining of left leg pain which has improved in the last few days.  He denies any back pain shoulder pain or any pathological fractures.  He is ambulating and attending to activities of daily living without any decline.  He does not report any headaches, blurry vision, syncope or seizures. Does not report any fevers, chills or sweats.  Does not report any cough, wheezing or hemoptysis.  Does not report any chest pain, palpitation, orthopnea or leg edema.  Does not report any nausea, vomiting or abdominal pain.  Does not report any constipation or diarrhea.  Does not report any skeletal complaints.    Does not report frequency, urgency or hematuria.  Does not report  any skin rashes or lesions. Does not report any heat or cold intolerance.  Does not report any lymphadenopathy or petechiae.  Does not report any anxiety or depression.  Remaining review of systems is negative.    Past Medical History:  Diagnosis Date  . Hypertension   . 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 Prostate cancer metastatic to multiple sites [facility]) urologist-  dr Jeffie Pollock   dx 08-25-2017--  Gleason 9, PSA 542,  vol 67cc--  METs to bone (multiple sites) and lymph nodes  . Wears glasses   :  Past Surgical History:  Procedure Laterality Date  . APPENDECTOMY  1963  . COLONOSCOPY WITH PROPOFOL  2017  . ORCHIECTOMY Bilateral 09/08/2017   Procedure: ORCHIECTOMY;  Surgeon: Irine Seal, MD;  Location: [facility];  Service: Urology;  Laterality: Bilateral;  :   Current Outpatient Medications:  .  aspirin EC 81 MG tablet, Take 81 mg by mouth daily., Disp: , Rfl:  .  Cholecalciferol (VITAMIN D-3) 1000 units CAPS, Take by mouth daily., Disp: , Rfl:  .  HYDROcodone-acetaminophen (NORCO) 5-325 MG tablet, Take 1 tablet by mouth every 6 (six) hours as needed for moderate pain., Disp: 8 tablet, Rfl: 0 .  losartan-hydrochlorothiazide (HYZAAR) 50-12.5 MG tablet, Take 1 tablet by mouth every evening. , Disp: , Rfl:  .  Multiple Vitamin (MULTIVITAMIN) tablet, Take 1 tablet by mouth daily., Disp: , Rfl:  .  tamsulosin (FLOMAX) 0.4 MG CAPS capsule, Take 0.4  mg by mouth daily after supper., Disp: , Rfl: :  No Known Allergies:  No family history on file.:  Social History   Socioeconomic History  . Marital status: Married    Spouse name: Not on file  . Number of children: Not on file  . Years of education: Not on file  . Highest education level: Not on file  Occupational History  . Not on file  Social Needs  . Financial resource strain: Not on file  . Food insecurity:    Worry: Not on file    Inability: Not on file  . Transportation needs:    Medical: Not  on file    Non-medical: Not on file  Tobacco Use  . Smoking status: Former Smoker    Years: 20.00    Types: Cigarettes    Last attempt to quit: 09/08/2006    Years since quitting: 11.0  . Smokeless tobacco: Never Used  Substance and Sexual Activity  . Alcohol use: Yes    Alcohol/week: 4.2 oz    Types: 7 Standard drinks or equivalent per week    Comment: 1 drink per day  . Drug use: Not on file  . Sexual activity: Not on file  Lifestyle  . Physical activity:    Days per week: Not on file    Minutes per session: Not on file  . Stress: Not on file  Relationships  . Social connections:    Talks on phone: Not on file    Gets together: Not on file    Attends religious service: Not on file    Active member of club or organization: Not on file    Attends meetings of clubs or organizations: Not on file    Relationship status: Not on file  . Intimate partner violence:    Fear of current or ex partner: Not on file    Emotionally abused: Not on file    Physically abused: Not on file    Forced sexual activity: Not on file  Other Topics Concern  . Not on file  Social History Narrative  . Not on file  :  Pertinent items are noted in HPI.  Exam: Blood pressure (!) 119/48, pulse 79, temperature 98.4 F (36.9 C), temperature source Oral, resp. rate 18, height 5\' 10"  (1.778 m), weight 197 lb 9.6 oz (89.6 kg), SpO2 100 %.   ECOG 0 General appearance: alert and cooperative appeared without distress. Head: atraumatic without any abnormalities. Eyes: conjunctivae/corneas clear.  Extraocular muscle intact. Throat: lips, mucosa, and tongue normal; without oral thrush or ulcers. Resp: clear to auscultation bilaterally without rhonchi, wheezes or dullness to percussion. Cardio: regular rate and rhythm, S1, S2 normal, no murmur, click, rub or gallop GI: soft, non-tender; bowel sounds normal; no masses,  no organomegaly Skin: Skin color, texture, turgor normal. No rashes or lesions Lymph  nodes: Cervical, supraclavicular, and axillary nodes normal. Neurologic: Grossly normal without any motor, sensory or deep tendon reflexes. Musculoskeletal: No joint deformity or effusion.  CBC    Component Value Date/Time   HGB 14.3 09/08/2017 1121   HCT 42.0 09/08/2017 1121     Chemistry      Component Value Date/Time   NA 140 09/08/2017 1121   K 3.6 09/08/2017 1121   CL 101 09/08/2017 1121   BUN 20 09/08/2017 1121   CREATININE 0.70 09/08/2017 1121   No results found for: CALCIUM, ALKPHOS, AST, ALT, BILITOT     Nm Bone Scan Whole Body  Result Date:  09/02/2017 CLINICAL DATA:  Prostate cancer, PSA = 542.17 on 08/25/2015 EXAM: NUCLEAR MEDICINE WHOLE BODY BONE SCAN TECHNIQUE: Whole body anterior and posterior images were obtained approximately 3 hours after intravenous injection of radiopharmaceutical. RADIOPHARMACEUTICALS:  21.4 mCi Technetium-4m MDP IV COMPARISON:  None Radiographic correlation: CT pelvis 09/02/2017 FINDINGS: Abnormal increased tracer localization is identified at the midthoracic spine at approximately T5, at the lateral RIGHT fourth rib, in the lumbar spine at LEFT L3, in the LEFT ischium and LEFT inferior pubic ramus into LEFT pubic body, and in the proximal LEFT femur. These sites are consistent with osseous metastatic disease. Sclerosis is identified in LEFT ischium/inferior pubic ramus as well as in LEFT femoral neck. Uptake in lower lumbar spine at L4-L5 corresponds to degenerative disc disease changes on CT. Additional degenerative type uptake at the shoulders and knees typically degenerative. Expected urinary tract and soft tissue distribution of tracer. IMPRESSION: Multiple sites of abnormal increased tracer localization including midthoracic spine at T5, at LEFT L3, lateral RIGHT fourth rib, LEFT ischium, LEFT inferior pubic ramus and pubic body, and LEFT femoral neck consistent with osseous metastases. Electronically Signed   By: Lavonia Dana M.D.   On: 09/02/2017  15:09    Assessment and Plan:   67 year old man with the following:  1.  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  His Gleason score is 4+5 = 9 and a PSA 542.  He is status post bilateral orchiectomy completed by Dr. Jeffie Pollock on September 08, 2017.  The natural course of this disease as well as treatment options were reviewed today with the patient and his wife in detail.  He understands he has an incurable malignancy but disease that can be palliated for an extended period of time.  The cornerstone of treating his disease would be androgen deprivation which she has accomplished by bilateral orchiectomy.  The role for additional therapy was reviewed today in detail in the form of second line hormone therapy such as Zytiga or Xtandi versus systemic chemotherapy.  The rationale for using this therapy as well as the clinical trials that led to the approval of these medications were reviewed in detail.  Improvement in overall survival as well as metastatic free survival has been improved by adding these therapies upfront with androgen deprivation.  Risks and benefits of Zytiga versus systemic chemotherapy was discussed today in detail.  Complication of both medication were reviewed as well as the logistics.  Complication associated with Zytiga include nausea, fatigue, edema, hypertension, hypokalemia and adrenal insufficiency.  Complication associated with chemotherapy include nausea, vomiting, myelosuppression, hair loss among others.  After discussion today, he elected to proceed with Zytiga 1000 mg daily with prednisone at 5 mg.  Written information was given to the patient and we we will get the process started for insurance approval.  2.  Androgen deprivation: He is status post bilateral orchiectomy and testosterone will be measured periodically to ensure adequate castration.  3.  Hypertension: His blood pressure is under excellent  control and currently on Hyzaar.  I will communicate with Dr. Zadie Rhine for potentially discontinuation of hydrochlorothiazide because of risk of worsening hypokalemia associated with Zytiga.  His blood pressure needs to be monitored periodically given the risk of hypertension associated with Zytiga.  4.  Bone care: He is at risk of developing pathological fractures given his metastatic disease to the bone.  He is already on calcium and vitamin D supplements.  Risks and benefits of using  Xgeva in the future was reviewed today.  Complications such as hypocalcemia and osteonecrosis of the jaw were reviewed.  He is in the process of completing dental procedure and will obtain dental clearance in the near future.  Delton See will start tentatively in August 2019.  5.  Prognosis and goals of care: His disease is incurable but could be palliated for an extended period of time.  His performance status is excellent and he is a candidate for aggressive therapy.  6.  Follow-up: We will be in 4 weeks to follow his progress after starting Zytiga.  60  minutes was spent with the patient face-to-face today.  More than 50% of time was dedicated to patient counseling, education and and discussing the natural course of his disease as well as alternative treatment options.    Thank you for the referral.  I had the pleasure of meeting Ethan [name] today.  A copy of this consult has been forwarded to the requesting physician.

## 2017-09-26 ENCOUNTER — Telehealth: Payer: Self-pay | Admitting: Pharmacist

## 2017-09-26 ENCOUNTER — Telehealth: Payer: Self-pay | Admitting: *Deleted

## 2017-09-26 MED ORDER — ABIRATERONE ACETATE 500 MG PO TABS: 1000.0000 mg | ORAL_TABLET | Freq: Every day | ORAL | 0 refills | Status: DC

## 2017-09-26 NOTE — Telephone Encounter (Signed)
Oral Oncology Pharmacist Encounter  Received notification from [place] insurance that authorization for Zytiga 250 mg tablets has been denied.   Reason for denial is that Zytiga 500 mg tablets are the preferred formulary alternative  I called [employer] at [phone] to follow-up on insurance denial [employer] representative cancelled current request and initiated new insurance request for the Zytiga 500 mg tablets This will can be completed online at [email or web address]  This will be updated in a separate encounter  [name], PharmD, BCPS, BCOP  09/26/2017 10:21 AM Oral Oncology Clinic [phone]

## 2017-09-26 NOTE — Telephone Encounter (Addendum)
Oral Oncology Pharmacist Encounter  Received notification from [employer] insurance that Zytiga 500 mg tablets is their formulary preferred agent.  New prescription for Zytiga 500 mg tablets, take 2 tablets (1000 mg) by mouth once daily on an empty stomach sent to the [facility] outpatient pharmacy.  I will alert pharmacy to continue to process prescription once insurance authorization is received. Already secured [employer] copayment grant will be applied to any copayment due at the pharmacy.  Once insurance authorization is obtained and I have heard from pharmacy that medication can be dispensed, I will follow-up with patient for coordination of medication acquisition.  [name], PharmD, BCPS, BCOP  09/26/2017 10:37 AM Oral Oncology Clinic [phone]

## 2017-09-26 NOTE — Telephone Encounter (Signed)
Oral Oncology Pharmacist Encounter  I spoke with patient and alerted him about copayment grant and insurance authorization.  Patient informed that prescription will be filled for 500 mg tablets, and he will take 2 tablets daily as opposed to 4 tablets daily in order to reach his total daily dose of 1000 mg.  Zytiga 500 mg tablets quantity #60 will be ordered today at the [facility].  Prescription will shipped to patient's home tomorrow (09/27/2017) for delivery on Wednesday, 09/28/2017.  Zytiga start date: 09/29/2017  Patient expressed understanding and appreciation. Patient knows to call the office with any additional questions or concerns.  [name], PharmD, BCPS, BCOP  09/26/2017 11:42 AM Oral Oncology Clinic [phone]

## 2017-09-26 NOTE — Telephone Encounter (Signed)
Oral Oncology Pharmacist Encounter  Received notification from [facility]  that prior authorization for Zytiga is required.  PA submitted on CoverMyMeds Key [id number] Status is pending, may take up to 72 hours for determination  Oral Oncology Clinic will continue to follow.  [name], PharmD, BCPS, BCOP  09/26/2017 10:26 AM Oral Oncology Clinic [phone]

## 2017-09-26 NOTE — Telephone Encounter (Signed)
Oral Oncology Pharmacist Encounter  Insurance authorization for Zytiga 500 mg tablets has been approved Effective dates 06/28/2017-09/25/2020  Customer care phone number: [phone]  [name], PharmD, BCPS, BCOP  09/26/2017 10:40 AM Oral Oncology Clinic [phone]

## 2017-09-26 NOTE — Telephone Encounter (Signed)
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 His primary physician doesn't believe in sleep aids and has declined to refill any. Can Dr. Alen Blew order something for sleep or do we need to call Dr. Jeffie Pollock? My return number is [phone].

## 2017-09-27 MED FILL — ZYTIGA 500 MG TAB: 500 | 30 days supply | Qty: 60 | Fill #0

## 2017-09-27 NOTE — Telephone Encounter (Signed)
I will note be prescribing any sleep medication. He can try melatonin over the counter instead.

## 2017-09-28 NOTE — Telephone Encounter (Signed)
Oral Oncology Patient Advocate Encounter  I verified with [place] that the Fabio Asa was mailed 09-27-17 to receive 09-28-17.  [place] Patient [place] Phone [phone] Fax [phone]

## 2017-09-29 ENCOUNTER — Telehealth: Payer: Self-pay | Admitting: *Deleted

## 2017-09-29 NOTE — Telephone Encounter (Signed)
Per dr Alen Blew, he will not prescribe a sleep med. He suggests melatonin. Wife laurie states he has tried this and it did not work. Suggested otc unisom. States she will try this.

## 2017-10-19 ENCOUNTER — Other Ambulatory Visit: Payer: Self-pay | Admitting: Oncology

## 2017-10-19 DIAGNOSIS — C61 Malignant neoplasm of prostate: Secondary | ICD-10-CM

## 2017-10-24 MED FILL — ZYTIGA 500 MG TAB: 500 | 30 days supply | Qty: 60 | Fill #0

## 2017-10-26 DIAGNOSIS — C61 Malignant neoplasm of prostate: Secondary | ICD-10-CM | POA: Diagnosis not present

## 2017-10-27 ENCOUNTER — Inpatient Hospital Stay (HOSPITAL_BASED_OUTPATIENT_CLINIC_OR_DEPARTMENT_OTHER): Payer: Medicare Other | Admitting: Oncology

## 2017-10-27 ENCOUNTER — Inpatient Hospital Stay: Payer: Medicare Other

## 2017-10-27 ENCOUNTER — Inpatient Hospital Stay: Payer: Medicare Other | Attending: Oncology

## 2017-10-27 ENCOUNTER — Telehealth: Payer: Self-pay | Admitting: Oncology

## 2017-10-27 DIAGNOSIS — I10 Essential (primary) hypertension: Secondary | ICD-10-CM | POA: Insufficient documentation

## 2017-10-27 DIAGNOSIS — C61 Malignant neoplasm of prostate: Secondary | ICD-10-CM | POA: Insufficient documentation

## 2017-10-27 DIAGNOSIS — Z79899 Other long term (current) drug therapy: Secondary | ICD-10-CM | POA: Diagnosis not present

## 2017-10-27 LAB — CBC WITH DIFFERENTIAL (CANCER CENTER ONLY)
Basophils Absolute: 0.1 10*3/uL (ref 0.0–0.1)
Basophils Relative: 1 %
Eosinophils Absolute: 0.1 10*3/uL (ref 0.0–0.5)
Eosinophils Relative: 2 %
HCT: 39.7 % (ref 38.4–49.9)
HEMOGLOBIN: 13.7 g/dL (ref 13.0–17.1)
LYMPHS PCT: 21 %
Lymphs Abs: 1.8 10*3/uL (ref 0.9–3.3)
MCH: 31.5 pg (ref 27.2–33.4)
MCHC: 34.5 g/dL (ref 32.0–36.0)
MCV: 91.4 fL (ref 79.3–98.0)
Monocytes Absolute: 0.5 10*3/uL (ref 0.1–0.9)
Monocytes Relative: 6 %
NEUTROS PCT: 70 %
Neutro Abs: 6 10*3/uL (ref 1.5–6.5)
Platelet Count: 271 10*3/uL (ref 140–400)
RBC: 4.34 MIL/uL (ref 4.20–5.82)
RDW: 12.1 % (ref 11.0–14.6)
WBC: 8.6 10*3/uL (ref 4.0–10.3)

## 2017-10-27 LAB — CMP (CANCER CENTER ONLY)
ALT: 25 U/L (ref 0–44)
AST: 22 U/L (ref 15–41)
Albumin: 4.3 g/dL (ref 3.5–5.0)
Alkaline Phosphatase: 127 U/L — ABNORMAL HIGH (ref 38–126)
Anion gap: 8 (ref 5–15)
BUN: 19 mg/dL (ref 8–23)
CHLORIDE: 103 mmol/L (ref 98–111)
CO2: 30 mmol/L (ref 22–32)
Calcium: 9.6 mg/dL (ref 8.9–10.3)
Creatinine: 0.79 mg/dL (ref 0.61–1.24)
GFR, Est AFR Am: >60 mL/min (ref >60)
Glucose, Bld: 120 mg/dL — ABNORMAL HIGH (ref 70–99)
POTASSIUM: 4.4 mmol/L (ref 3.5–5.1)
Sodium: 141 mmol/L (ref 135–145)
Total Bilirubin: 0.9 mg/dL (ref 0.3–1.2)
Total Protein: 6.8 g/dL (ref 6.5–8.1)

## 2017-10-27 MED ORDER — DENOSUMAB 120 MG/1.7ML ~~LOC~~ SOLN
SUBCUTANEOUS | Status: AC
  Filled 2017-10-27: qty 1.7

## 2017-10-27 MED ORDER — DENOSUMAB 120 MG/1.7ML ~~LOC~~ SOLN: 120.0000 mg | Freq: Once | SUBCUTANEOUS | Status: DC

## 2017-10-27 NOTE — Telephone Encounter (Signed)
Scheduled appt per 8/15 los - gave patient AVS and calender per los.  

## 2017-10-27 NOTE — Progress Notes (Signed)
Hematology and Oncology Follow Up Visit  Ethan [name] [id number] [birth year] 67 y.o. 10/27/2017 3:04 PM [name], Ethan [name], MDRankins, Ethan Salinas, MD   Principle Diagnosis: 67 year old man with castration-resistant prostate cancer diagnosed in June 2019.  He presented with a Gleason score 4+5 = 9, PSA 542.  He was found to have lymphadenopathy and bone disease.   Prior Therapy:   He is status post orchiectomy completed by Dr. Jeffie [name] on September 08, 2017.  Current therapy:  Zytiga 1000 mg daily with prednisone started in July 2019.  Interim History: Ethan [name] presents today for a follow-up visit.  Since last visit, he started Zytiga and has tolerated this medication very well.  He denies any complications related to it.  He denies any excessive fatigue or tiredness.  He denies any edema or arthralgias.  His appetite is excellent performance status is unchanged.  He has been taking calcium and vitamin D supplements without any issues.  He does not report any headaches, blurry vision, syncope or seizures. Does not report any fevers, chills or sweats.  Does not report any cough, wheezing or hemoptysis.  Does not report any chest pain, palpitation, orthopnea or leg edema.  Does not report any nausea, vomiting or abdominal pain.  Does not report any constipation or diarrhea.  Does not report any skeletal complaints.    Does not report frequency, urgency or hematuria.  Does not report any skin rashes or lesions. Does not report any heat or cold intolerance.  Does not report any lymphadenopathy or petechiae.  Does not report any anxiety or depression.  Remaining review of systems is negative.    Medications: I have reviewed the patient's current medications.  Current Outpatient Medications  Medication Sig Dispense Refill  . aspirin EC 81 MG tablet Take 81 mg by mouth daily.    . Cholecalciferol (VITAMIN D-3) 1000 units CAPS Take by mouth daily.    Marland Kitchen losartan-hydrochlorothiazide (HYZAAR) 50-12.5 MG  tablet Take 1 tablet by mouth every evening.     . Multiple Vitamin (MULTIVITAMIN) tablet Take 1 tablet by mouth daily.    . predniSONE (DELTASONE) 5 MG tablet Take 1 tablet (5 mg total) by mouth daily with breakfast. 90 tablet 3  . tamsulosin (FLOMAX) 0.4 MG CAPS capsule Take 0.4 mg by mouth daily after supper.    Marland Kitchen ZYTIGA 500 MG tablet TAKE 2 TABLETS (1,000 MG TOTAL) BY MOUTH DAILY. TAKE ON AN EMPTY STOMACH 1 HOUR BEFORE OR 2 HOURS AFTER A MEAL. 60 tablet 0   No current facility-administered medications for this visit.      Allergies: No Known Allergies  Past Medical History, Surgical history, Social history, and Family History were reviewed and updated.  Review of Systems:  Remaining ROS negative.  Physical Exam: Blood pressure 126/71, pulse 66, temperature 97.9 F (36.6 C), temperature source Oral, resp. rate 18, height 5\' 10"  (1.778 m), weight 195 lb 14.4 oz (88.9 kg), SpO2 100 %. ECOG: 0 General appearance: alert and cooperative appeared without distress. Head: Normocephalic, without obvious abnormality Oropharynx: No oral thrush or ulcers. Eyes: No scleral icterus.  Pupils are equal and round reactive to light. Lymph nodes: Cervical, supraclavicular, and axillary nodes normal. Heart:regular rate and rhythm, S1, S2 normal, no murmur, click, rub or gallop Lung:chest clear, no wheezing, rales, normal symmetric air entry Abdomin: soft, non-tender, without masses or organomegaly. Neurological: No motor, sensory deficits.  Intact deep tendon reflexes. Skin: No rashes or lesions.  No ecchymosis or petechiae. Musculoskeletal: No joint deformity  or effusion.     Lab Results: Lab Results  Component Value Date   WBC 8.6 10/27/2017   HGB 13.7 10/27/2017   HCT 39.7 10/27/2017   MCV 91.4 10/27/2017   PLT 271 10/27/2017     Chemistry      Component Value Date/Time   NA 140 09/08/2017 1121   K 3.6 09/08/2017 1121   CL 101 09/08/2017 1121   BUN 20 09/08/2017 1121    CREATININE 0.70 09/08/2017 1121   No results found for: CALCIUM, ALKPHOS, AST, ALT, BILITOT     Impression and Plan:   67 year old man with the following:  1.  Castration-sensitive prostate cancer diagnosed in June 2019 with Gleason score is 4+5 = 9 and a PSA 542.    He is currently on Zytiga 1000 mg daily with prednisone without any major complications.  Risks and benefits of continuing this medication indefinitely was reviewed and he is agreeable to continue.  We will continue to monitor his PSA periodically at this time.   2.  Androgen deprivation: His testosterone will be measured periodically after bilateral orchiectomy completed in June 2019.  3.  Hypertension: His blood pressure is within normal range at this time without any issue related to Zytiga.  4.    Bone directed therapy: Risks and benefits of starting Delton See was reviewed today.  He has obtain dental clearance and we will tentatively start Xgeva on 11/02/2017 and will be repeated every 6 to 8 weeks.  5.  Prognosis and goals of care: His disease is incurable but it is treatable and his performance status is excellent and aggressive therapy is warranted.  6.  Follow-up: We will be in 6 weeks to follow his progress.  15 minutes was spent with the patient face-to-face today.  More than 50% of time was dedicated to discussing the natural course of this disease, treatment options and complications related to therapy.     Ethan Button, MD 8/15/20193:04 PM

## 2017-10-28 ENCOUNTER — Telehealth: Payer: Self-pay | Admitting: *Deleted

## 2017-10-28 LAB — PROSTATE-SPECIFIC AG, SERUM (LABCORP)

## 2017-10-28 NOTE — Telephone Encounter (Signed)
-----   Message from Wyatt Portela, MD sent at 10/28/2017  8:09 AM EDT ----- Please let him know his PSA is very low.

## 2017-10-28 NOTE — Telephone Encounter (Signed)
Spoke with patient, gave results of last PSA 

## 2017-11-02 ENCOUNTER — Inpatient Hospital Stay: Payer: Medicare Other

## 2017-11-02 DIAGNOSIS — C61 Malignant neoplasm of prostate: Secondary | ICD-10-CM

## 2017-11-02 DIAGNOSIS — Z79899 Other long term (current) drug therapy: Secondary | ICD-10-CM | POA: Diagnosis not present

## 2017-11-02 DIAGNOSIS — I10 Essential (primary) hypertension: Secondary | ICD-10-CM | POA: Diagnosis not present

## 2017-11-02 MED ORDER — DENOSUMAB 120 MG/1.7ML ~~LOC~~ SOLN
SUBCUTANEOUS | Status: AC
  Filled 2017-11-02: qty 1.7

## 2017-11-02 MED ORDER — DENOSUMAB 120 MG/1.7ML ~~LOC~~ SOLN
120.0000 mg | Freq: Once | SUBCUTANEOUS | Status: AC
  Administered 2017-11-02: 120 mg via SUBCUTANEOUS

## 2017-11-02 NOTE — Patient Instructions (Signed)

## 2017-11-08 ENCOUNTER — Telehealth: Payer: Self-pay | Admitting: Oncology

## 2017-11-08 NOTE — Telephone Encounter (Signed)
Faxed medical records to [facility], Release ID: [id number]

## 2017-11-15 ENCOUNTER — Other Ambulatory Visit: Payer: Self-pay | Admitting: Oncology

## 2017-11-15 DIAGNOSIS — C61 Malignant neoplasm of prostate: Secondary | ICD-10-CM

## 2017-11-23 MED FILL — ZYTIGA 500 MG TAB: 500 | 30 days supply | Qty: 60 | Fill #0

## 2017-12-14 ENCOUNTER — Other Ambulatory Visit: Payer: Self-pay | Admitting: *Deleted

## 2017-12-14 ENCOUNTER — Other Ambulatory Visit: Payer: Self-pay | Admitting: Oncology

## 2017-12-14 DIAGNOSIS — C61 Malignant neoplasm of prostate: Secondary | ICD-10-CM

## 2017-12-15 ENCOUNTER — Inpatient Hospital Stay: Payer: Medicare Other | Attending: Oncology

## 2017-12-15 ENCOUNTER — Inpatient Hospital Stay: Payer: Medicare Other

## 2017-12-15 ENCOUNTER — Inpatient Hospital Stay (HOSPITAL_BASED_OUTPATIENT_CLINIC_OR_DEPARTMENT_OTHER): Payer: Medicare Other | Admitting: Oncology

## 2017-12-15 ENCOUNTER — Telehealth: Payer: Self-pay | Admitting: Oncology

## 2017-12-15 VITALS — BP 129/68 | HR 61 | Temp 98.7°F | Resp 18 | Ht 70.0 in | Wt 187.6 lb

## 2017-12-15 DIAGNOSIS — I10 Essential (primary) hypertension: Secondary | ICD-10-CM | POA: Diagnosis not present

## 2017-12-15 DIAGNOSIS — C7951 Secondary malignant neoplasm of bone: Secondary | ICD-10-CM | POA: Diagnosis not present

## 2017-12-15 DIAGNOSIS — Z7982 Long term (current) use of aspirin: Secondary | ICD-10-CM | POA: Insufficient documentation

## 2017-12-15 DIAGNOSIS — Z79899 Other long term (current) drug therapy: Secondary | ICD-10-CM

## 2017-12-15 DIAGNOSIS — R591 Generalized enlarged lymph nodes: Secondary | ICD-10-CM | POA: Diagnosis not present

## 2017-12-15 DIAGNOSIS — C61 Malignant neoplasm of prostate: Secondary | ICD-10-CM | POA: Diagnosis not present

## 2017-12-15 LAB — CMP (CANCER CENTER ONLY)
ALT: 24 U/L (ref 0–44)
AST: 25 U/L (ref 15–41)
Albumin: 4.5 g/dL (ref 3.5–5.0)
Alkaline Phosphatase: 70 U/L (ref 38–126)
Anion gap: 7 (ref 5–15)
BUN: 21 mg/dL (ref 8–23)
CHLORIDE: 107 mmol/L (ref 98–111)
CO2: 29 mmol/L (ref 22–32)
Calcium: 9.6 mg/dL (ref 8.9–10.3)
Creatinine: 0.86 mg/dL (ref 0.61–1.24)
GFR, Est AFR Am: >60 mL/min (ref >60)
Glucose, Bld: 125 mg/dL — ABNORMAL HIGH (ref 70–99)
POTASSIUM: 4.3 mmol/L (ref 3.5–5.1)
SODIUM: 143 mmol/L (ref 135–145)
Total Bilirubin: 1.5 mg/dL — ABNORMAL HIGH (ref 0.3–1.2)
Total Protein: 7 g/dL (ref 6.5–8.1)

## 2017-12-15 LAB — CBC WITH DIFFERENTIAL (CANCER CENTER ONLY)
BASOS ABS: 0.1 10*3/uL (ref 0.0–0.1)
Basophils Relative: 1 %
EOS ABS: 0.1 10*3/uL (ref 0.0–0.5)
EOS PCT: 1 %
HCT: 38.8 % (ref 38.4–49.9)
Hemoglobin: 13.3 g/dL (ref 13.0–17.1)
LYMPHS PCT: 20 %
Lymphs Abs: 1.4 10*3/uL (ref 0.9–3.3)
MCH: 32.3 pg (ref 27.2–33.4)
MCHC: 34.2 g/dL (ref 32.0–36.0)
MCV: 94.4 fL (ref 79.3–98.0)
MONO ABS: 0.3 10*3/uL (ref 0.1–0.9)
Monocytes Relative: 5 %
Neutro Abs: 5.1 10*3/uL (ref 1.5–6.5)
Neutrophils Relative %: 73 %
PLATELETS: 262 10*3/uL (ref 140–400)
RBC: 4.11 MIL/uL — ABNORMAL LOW (ref 4.20–5.82)
RDW: 12.6 % (ref 11.0–14.6)
WBC Count: 7 10*3/uL (ref 4.0–10.3)

## 2017-12-15 MED ORDER — DENOSUMAB 120 MG/1.7ML ~~LOC~~ SOLN
120.0000 mg | Freq: Once | SUBCUTANEOUS | Status: AC
  Administered 2017-12-15: 120 mg via SUBCUTANEOUS

## 2017-12-15 NOTE — Telephone Encounter (Signed)
Gave pt avs and calendar  °

## 2017-12-15 NOTE — Progress Notes (Signed)
Hematology and Oncology Follow Up Visit  Ethan [name] [id number] [birth year] 67 y.o. 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  He he has documented lymphadenopathy and bone disease.  Prior Therapy:   He is status post orchiectomy completed by Dr. Jeffie Pollock on September 08, 2017.  Current therapy:  Zytiga 1000 mg daily with prednisone started in July 2019.  Interim History: [name] is here for a follow-up visit.  Since the last visit, he continues to take Zytiga without any complications.  He has tolerated therapy without any side effects.  He denies any nausea, fatigue or lower extremity edema.  His performance status and activity level remain excellent.  He denies any bone pain or pathological fractures.  He denies any changes in his urination.  He does not report any headaches, blurry vision, syncope or seizures.  Denies any dizziness or lethargy.  Does not report any fevers, chills or sweats.  Does not report any cough, wheezing or hemoptysis.  Does not report any chest pain, palpitation, orthopnea or leg edema.  Does not report any nausea, vomiting or abdominal pain.  Does not report any ranges bowel habits.  Does not report frequency, urgency or hematuria.  Does not report any skin rashes or lesions.  Does not report any lymphadenopathy or petechiae.  Does not report any mood changes.  He denies any bleeding or clotting tendency.  Remaining review of systems is negative.    Medications: I have reviewed the patient's current medications.  Current Outpatient Medications  Medication Sig Dispense Refill  . aspirin EC 81 MG tablet Take 81 mg by mouth daily.    . Cholecalciferol (VITAMIN D-3) 1000 units CAPS Take by mouth daily.    Marland Kitchen losartan-hydrochlorothiazide (HYZAAR) 50-12.5 MG tablet Take 1 tablet by mouth every evening.      . Multiple Vitamin (MULTIVITAMIN) tablet Take 1 tablet by mouth daily.    . predniSONE (DELTASONE) 5 MG tablet Take 1 tablet (5 mg total) by mouth daily with breakfast. 90 tablet 3  . tamsulosin (FLOMAX) 0.4 MG CAPS capsule Take 0.4 mg by mouth daily after supper.    Marland Kitchen ZYTIGA 500 MG tablet TAKE 2 TABLETS (1,000 MG TOTAL) BY MOUTH DAILY. TAKE ON AN EMPTY STOMACH 1 HOUR BEFORE OR 2 HOURS AFTER A MEAL. 60 tablet 0   No current facility-administered medications for this visit.    Facility-Administered Medications Ordered in Other Visits  Medication Dose Route Frequency Provider Last Rate Last Dose  . denosumab (XGEVA) injection 120 mg  120 mg Subcutaneous Once Shadad, Mathis Dad, MD         Allergies: No Known Allergies  Past Medical History, Surgical history, Social history, and Family History were reviewed and updated.  Review of Systems:  Remaining ROS negative.  Physical Exam: Blood pressure 129/68, pulse 61, temperature 98.7 F (37.1 C), temperature source Oral, resp. rate 18, height 5\' 10"  (1.778 m), weight 187 lb 9.6 oz (85.1 kg), SpO2 99 %. ECOG: 0  General appearance: Comfortable appearing without any discomfort Head: Normocephalic without any trauma Oropharynx: Mucous membranes are moist and pink without any thrush or ulcers. Eyes: Pupils are equal and round reactive to light. Lymph nodes: No cervical, supraclavicular, inguinal or axillary lymphadenopathy.   Heart:regular rate and rhythm.  S1 and S2 without leg edema. Lung: Clear without any rhonchi or wheezes.  No dullness to percussion. Abdomin: Soft, nontender, nondistended with good bowel sounds.  No hepatosplenomegaly. Musculoskeletal: No joint deformity or effusion.  Full range of motion noted. Neurological: No deficits noted on motor, sensory and deep tendon reflex exam. Skin: No petechial rash or dryness.  Appeared moist.       Lab Results: Lab Results  Component Value Date   WBC 7.0 12/15/2017   HGB 13.3  12/15/2017   HCT 38.8 12/15/2017   MCV 94.4 12/15/2017   PLT 262 12/15/2017     Chemistry      Component Value Date/Time   NA 143 12/15/2017 1136   K 4.3 12/15/2017 1136   CL 107 12/15/2017 1136   CO2 29 12/15/2017 1136   BUN 21 12/15/2017 1136   CREATININE 0.86 12/15/2017 1136      Component Value Date/Time   CALCIUM 9.6 12/15/2017 1136   ALKPHOS 70 12/15/2017 1136   AST 25 12/15/2017 1136   ALT 24 12/15/2017 1136   BILITOT 1.5 (H) 12/15/2017 1136     Results for Ethan [name] "[name]" (MRN [id number]) as of 12/15/2017 12:07  Ref. Range 10/27/2017 14:35  Prostate Specific Ag, Serum Latest Ref Range: 0.0 - 4.0 ng/mL <0.1    Impression and Plan:   67 year old man with the following:  1.  Castration-sensitive prostate cancer presented with PSA of 542 with disease in the bone and lymphadenopathy.    He remains on Zytiga without any new side effects.  He has tolerated it and was able to taken daily without missing any doses.  Long-term complication associated with this therapy was reviewed and after discussion today he is agreeable to continue.  His PSA continues to be under excellent control and currently undetectable.  Different treatment options were reviewed today including Provenge immunotherapy among other agents that are approved for castration resistant disease which he does not have.  For the time being we will reserve these options if he develops castration resistant in the future.   2.  Androgen deprivation: He is status post orchiectomy and testosterone will be checked periodically.  3.  Hypertension: His blood pressure continues to be within normal range.  No changes because of Zytiga.  4.    Bone directed therapy: He is currently on Xgeva which will be continued every 2 months.  Long-term complication associated with this therapy includes osteonecrosis of the jaw and hypocalcemia which was reiterated today.  5.  Prognosis and goals of care: Treatment is  palliative but his performance status is excellent and aggressive therapy is warranted.  6.  Genetic counseling: Given his presentation of advanced prostate cancer he is interested to receive genetic counseling.  We will make the appropriate referrals today.  7.  Follow-up: We will be in 8 weeks to follow his progress.  25 minutes was spent with the patient face-to-face today.  More than 50% of time was dedicated to discussing his disease status including future treatment options as well as coordinating plan of care.     Zola Button, MD 10/3/201912:24 PM

## 2017-12-16 LAB — PROSTATE-SPECIFIC AG, SERUM (LABCORP): PROSTATE SPECIFIC AG, SERUM: 18.2 ng/mL — AB (ref 0.0–4.0)

## 2017-12-21 ENCOUNTER — Inpatient Hospital Stay (HOSPITAL_BASED_OUTPATIENT_CLINIC_OR_DEPARTMENT_OTHER): Payer: Medicare Other | Admitting: Genetic Counselor

## 2017-12-21 ENCOUNTER — Encounter: Payer: Self-pay | Admitting: Genetic Counselor

## 2017-12-21 ENCOUNTER — Inpatient Hospital Stay: Payer: Medicare Other

## 2017-12-21 DIAGNOSIS — Z1379 Encounter for other screening for genetic and chromosomal anomalies: Secondary | ICD-10-CM

## 2017-12-21 DIAGNOSIS — C61 Malignant neoplasm of prostate: Secondary | ICD-10-CM | POA: Diagnosis not present

## 2017-12-21 MED FILL — ZYTIGA 500 MG TAB: 500 | 30 days supply | Qty: 60 | Fill #0

## 2017-12-21 NOTE — Progress Notes (Signed)
REFERRING PROVIDER: Wyatt Portela, MD [place], [place] [address]  PRIMARY PROVIDER:  Aretta Nip, MD  PRIMARY REASON FOR VISIT:  1. Prostate cancer ([place])      HISTORY OF PRESENT ILLNESS:   Mr. Baze, a 67 y.o. male, was seen for a [facility] cancer genetics consultation at the request of Dr. Alen Blew due to a personal history of cancer.  Mr. Krantz presents to clinic today to discuss the possibility of a hereditary predisposition to cancer, genetic testing, and to further clarify his future cancer risks, as well as potential cancer risks for family members.   In June 2019, at the age of 70, Mr. Danis was diagnosed with metastatic prostate cancer. This was treated with surgery, but has not had additional treatment yet.     CANCER HISTORY:   No history exists.      Past Medical History:  Diagnosis Date  . Hypertension   . 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 Prostate cancer metastatic to multiple sites [facility]) urologist-  dr Jeffie Pollock   dx 08-25-2017--  Gleason 9, PSA 542,  vol 67cc--  METs to bone (multiple sites) and lymph nodes  . Wears glasses     Past Surgical History:  Procedure Laterality Date  . APPENDECTOMY  1963  . COLONOSCOPY WITH PROPOFOL  2017  . ORCHIECTOMY Bilateral 09/08/2017   Procedure: ORCHIECTOMY;  Surgeon: Irine Seal, MD;  Location: [facility];  Service: Urology;  Laterality: Bilateral;    Social History   Socioeconomic History  . Marital status: Married    Spouse name: Not on file  . Number of children: Not on file  . Years of education: Not on file  . Highest education level: Not on file  Occupational History  . Not on file  Social Needs  . Financial resource strain: Not on file  . Food insecurity:    Worry: Not on file    Inability: Not on file  . Transportation needs:    Medical: Not on file    Non-medical: Not on file  Tobacco Use  . Smoking status: Former Smoker    Years:  20.00    Types: Cigarettes    Last attempt to quit: 09/08/2006    Years since quitting: 11.2  . Smokeless tobacco: Never Used  Substance and Sexual Activity  . Alcohol use: Yes    Alcohol/week: 7.0 standard drinks    Types: 7 Standard drinks or equivalent per week    Comment: 1 drink per day  . Drug use: Not on file  . Sexual activity: Not on file  Lifestyle  . Physical activity:    Days per week: Not on file    Minutes per session: Not on file  . Stress: Not on file  Relationships  . Social connections:    Talks on phone: Not on file    Gets together: Not on file    Attends religious service: Not on file    Active member of club or organization: Not on file    Attends meetings of clubs or organizations: Not on file    Relationship status: Not on file  Other Topics Concern  . Not on file  Social History Narrative  . Not on file     FAMILY HISTORY:  We obtained a detailed, 4-generation family history.  Significant diagnoses are listed below: Family History  Problem Relation Age of Onset  . Dementia Mother   . Kidney failure  Father   . Lung cancer Maternal Aunt   . Lung cancer Maternal Uncle   . Lung cancer Paternal Uncle   . Lung cancer Maternal Grandmother   . Heart attack Paternal Grandfather   . Congestive Heart Failure Brother     The patient has two sons who are cancer free. He has two brothers and a sister.  One brother is deceased from heart disease.  Both parents are deceased.  The patient's mother died at 72 from complications of dementia.  She had two brothers and a sister.  One brother and her sister had lung cancer, the other brother died in [place].  Both parents are deceased.  The grandmother had lung cancer and the grandfather had COPD.  The patient's father died of kidney failure at 51.  He had two brothers, one who had lung cancer and died in his 70's.  The paternal grandparents died of non cancer related issues.  Mr. Fulmore is unaware of previous family  history of genetic testing for hereditary cancer risks. Patient's maternal ancestors are of [place] and [place] descent, and paternal ancestors are of [place] and [place] descent. There is no reported Ashkenazi Jewish ancestry. There is no known consanguinity.  GENETIC COUNSELING ASSESSMENT: Kaiel Weide is a 67 y.o. male with a personal history of metastatic prostate cancer which is somewhat suggestive of a hereditary cancer syndrome and need for PARP inhibitor use and predisposition to cancer. We, therefore, discussed and recommended the following at today's visit.   DISCUSSION: We discussed that about 15-20% of prostate cancer is hereditary, with most cases due to BRCA mutations.  Individuals who have metastatic prostate cancer are eligible for genetic testing to determine if there is a hereditary cause that could increase their risk for other cancers as well as their family members.  They also could be eligible for additional treatment options, such as PARP inhibitors, if they are gene positive.  We discussed that Mr. Evilsizer family history is not consistent with a hereditary condition, but that he is eligible based on his diagnosis.  We reviewed the characteristics, features and inheritance patterns of hereditary cancer syndromes. We also discussed genetic testing, including the appropriate family members to test, the process of testing, insurance coverage and turn-around-time for results. We discussed the implications of a negative, positive and/or variant of uncertain significant result. We recommended Mr. Hasler pursue genetic testing for the multi cancer gene panel. The Multi-Gene Panel offered by [facility] includes sequencing and/or deletion duplication testing of the following 84 genes: AIP, ALK, APC, ATM, AXIN2,BAP1,  BARD1, BLM, BMPR1A, BRCA1, BRCA2, BRIP1, CASR, CDC73, CDH1, CDK4, CDKN1B, CDKN1C, CDKN2A (p14ARF), CDKN2A (p16INK4a), CEBPA, CHEK2, CTNNA1, DICER1, DIS3L2, EGFR (c.2369C>T, p.Thr790Met  variant only), EPCAM (Deletion/duplication testing only), FH, FLCN, GATA2, GPC3, GREM1 (Promoter region deletion/duplication testing only), HOXB13 (c.251G>A, p.Gly84Glu), HRAS, KIT, MAX, MEN1, MET, MITF (c.952G>A, p.Glu318Lys variant only), MLH1, MSH2, MSH3, MSH6, MUTYH, NBN, NF1, NF2, NTHL1, PALB2, PDGFRA, PHOX2B, PMS2, POLD1, POLE, POT1, PRKAR1A, PTCH1, PTEN, RAD50, RAD51C, RAD51D, RB1, RECQL4, RET, RUNX1, SDHAF2, SDHA (sequence changes only), SDHB, SDHC, SDHD, SMAD4, SMARCA4, SMARCB1, SMARCE1, STK11, SUFU, TERC, TERT, TMEM127, TP53, TSC1, TSC2, VHL, WRN and WT1.    Based on Mr. Clauson personal and family history of cancer, he meets medical criteria for genetic testing. Despite that he meets criteria, he may still have an out of pocket cost. We discussed that if his out of pocket cost for testing is over $100, the laboratory will call and confirm whether he wants to  proceed with testing.  If the out of pocket cost of testing is less than $100 he will be billed by the genetic testing laboratory.   PLAN: After considering the risks, benefits, and limitations, Mr. Strole  provided informed consent to pursue genetic testing and the blood sample was sent to [facility] for analysis of the Multi cancer gene panel. Results should be available within approximately 2-3 weeks' time, at which point they will be disclosed by telephone to Mr. Schnapp, as will any additional recommendations warranted by these results. Mr. Sandler will receive a summary of his genetic counseling visit and a copy of his results once available. This information will also be available in Epic. We encouraged Mr. Hutzler to remain in contact with cancer genetics annually so that we can continuously update the family history and inform him of any changes in cancer genetics and testing that may be of benefit for his family. Mr. Bertino questions were answered to his satisfaction today. Our contact information was provided should additional  questions or concerns arise.  Lastly, we encouraged Mr. Bonura to remain in contact with cancer genetics annually so that we can continuously update the family history and inform him of any changes in cancer genetics and testing that may be of benefit for this family.   Mr.  Rathman questions were answered to his satisfaction today. Our contact information was provided should additional questions or concerns arise. Thank you for the referral and allowing [place] to share in the care of your patient.   Emani Morad P. Florene Glen, [place], [facility] Certified Genetic Counselor [email or web address] phone: [phone]  The patient was seen for a total of 45 minutes in face-to-face genetic counseling.  This patient was discussed with Drs. Magrinat, Lindi Adie and/or Burr Medico who agrees with the above.    _______________________________________________________________________ For Office Staff:  Number of people involved in session: 2 Was an Intern/ student involved with case: no

## 2017-12-26 DIAGNOSIS — Z23 Encounter for immunization: Secondary | ICD-10-CM | POA: Diagnosis not present

## 2017-12-29 ENCOUNTER — Encounter: Payer: Self-pay | Admitting: Genetic Counselor

## 2017-12-29 ENCOUNTER — Telehealth: Payer: Self-pay | Admitting: Genetic Counselor

## 2017-12-29 ENCOUNTER — Ambulatory Visit: Payer: Self-pay | Admitting: Genetic Counselor

## 2017-12-29 DIAGNOSIS — C61 Malignant neoplasm of prostate: Secondary | ICD-10-CM

## 2017-12-29 DIAGNOSIS — Z1379 Encounter for other screening for genetic and chromosomal anomalies: Secondary | ICD-10-CM

## 2017-12-29 NOTE — Progress Notes (Signed)
HPI:  [name] was previously seen in the [place] clinic due to a personal history of cancer and concerns regarding a hereditary predisposition to cancer. Please refer to our prior cancer genetics clinic note for more information regarding [name] medical, social and family histories, and our assessment and recommendations, at the time. [name] recent genetic test results were disclosed to him, as were recommendations warranted by these results. These results and recommendations are discussed in more detail below.  CANCER HISTORY:  Oncology History   Negative genetic testing     Prostate cancer ([place])   10/27/2017 Initial Diagnosis    Prostate cancer ([place])    12/26/2017 Genetic Testing    Negative genetic testing on the multii-cancer panel.  The Multi-Gene Panel offered by [facility] includes sequencing and/or deletion duplication testing of the following 84 genes: AIP, ALK, APC, ATM, AXIN2,BAP1,  BARD1, BLM, BMPR1A, BRCA1, BRCA2, BRIP1, CASR, CDC73, CDH1, CDK4, CDKN1B, CDKN1C, CDKN2A (p14ARF), CDKN2A (p16INK4a), CEBPA, CHEK2, CTNNA1, DICER1, DIS3L2, EGFR (c.2369C>T, p.Thr790Met variant only), EPCAM (Deletion/duplication testing only), FH, FLCN, GATA2, GPC3, GREM1 (Promoter region deletion/duplication testing only), HOXB13 (c.251G>A, p.Gly84Glu), HRAS, KIT, MAX, MEN1, MET, MITF (c.952G>A, p.Glu318Lys variant only), MLH1, MSH2, MSH3, MSH6, MUTYH, NBN, NF1, NF2, NTHL1, PALB2, PDGFRA, PHOX2B, PMS2, POLD1, POLE, POT1, PRKAR1A, PTCH1, PTEN, RAD50, RAD51C, RAD51D, RB1, RECQL4, RET, RUNX1, SDHAF2, SDHA (sequence changes only), SDHB, SDHC, SDHD, SMAD4, SMARCA4, SMARCB1, SMARCE1, STK11, SUFU, TERC, TERT, TMEM127, TP53, TSC1, TSC2, VHL, WRN and WT1.  The report date is December 26, 2017.     FAMILY HISTORY:  We obtained a detailed, 4-generation family history.  Significant diagnoses are listed below: Family History  Problem Relation Age of Onset  . Dementia Mother   . Kidney  failure Father   . Lung cancer Maternal Aunt   . Lung cancer Maternal Uncle   . Lung cancer Paternal Uncle   . Lung cancer Maternal Grandmother   . Heart attack Paternal Grandfather   . Congestive Heart Failure Brother     The patient has two sons who are cancer free. He has two brothers and a sister.  One brother is deceased from heart disease.  Both parents are deceased.  The patient's mother died at 75 from complications of dementia.  She had two brothers and a sister.  One brother and her sister had lung cancer, the other brother died in [place].  Both parents are deceased.  The grandmother had lung cancer and the grandfather had COPD.  The patient's father died of kidney failure at 69.  He had two brothers, one who had lung cancer and died in his 79's.  The paternal grandparents died of non cancer related issues.  [name] is unaware of previous family history of genetic testing for hereditary cancer risks. Patient's maternal ancestors are of [place] and [place] descent, and paternal ancestors are of [place] and [place] descent. There is no reported Ashkenazi Jewish ancestry. There is no known consanguinity.  GENETIC TEST RESULTS: Genetic testing reported out on December 26, 2017 through the multi-cancer panel found no deleterious mutations.  The Multi-Gene Panel offered by [facility] includes sequencing and/or deletion duplication testing of the following 84 genes: AIP, ALK, APC, ATM, AXIN2,BAP1,  BARD1, BLM, BMPR1A, BRCA1, BRCA2, BRIP1, CASR, CDC73, CDH1, CDK4, CDKN1B, CDKN1C, CDKN2A (p14ARF), CDKN2A (p16INK4a), CEBPA, CHEK2, CTNNA1, DICER1, DIS3L2, EGFR (c.2369C>T, p.Thr790Met variant only), EPCAM (Deletion/duplication testing only), FH, FLCN, GATA2, GPC3, GREM1 (Promoter region deletion/duplication testing only), HOXB13 (c.251G>A, p.Gly84Glu), HRAS, KIT, MAX, MEN1,  MET, MITF (c.952G>A, p.Glu318Lys variant only), MLH1, MSH2, MSH3, MSH6, MUTYH, NBN, NF1, NF2, NTHL1, PALB2, PDGFRA, PHOX2B, PMS2,  POLD1, POLE, POT1, PRKAR1A, PTCH1, PTEN, RAD50, RAD51C, RAD51D, RB1, RECQL4, RET, RUNX1, SDHAF2, SDHA (sequence changes only), SDHB, SDHC, SDHD, SMAD4, SMARCA4, SMARCB1, SMARCE1, STK11, SUFU, TERC, TERT, TMEM127, TP53, TSC1, TSC2, VHL, WRN and WT1.   The test report has been scanned into EPIC and is located under the Molecular Pathology section of the Results Review tab.    We discussed with [name] that since the current genetic testing is not perfect, it is possible there may be a gene mutation in one of these genes that current testing cannot detect, but that chance is small.  We also discussed, that it is possible that another gene that has not yet been discovered, or that we have not yet tested, is responsible for the cancer diagnoses in the family, and it is, therefore, important to remain in touch with cancer genetics in the future so that we can continue to offer [name] the most up to date genetic testing.    CANCER SCREENING RECOMMENDATIONS: This result is reassuring and indicates that [name] likely does not have an increased risk for a future cancer due to a mutation in one of these genes. This normal test also suggests that [name] cancer was most likely not due to an inherited predisposition associated with one of these genes.  Most cancers happen by chance and this negative test suggests that his cancer falls into this category.  We, therefore, recommended he continue to follow the cancer management and screening guidelines provided by his oncology and primary healthcare provider.   An individual's cancer risk and medical management are not determined by genetic test results alone. Overall cancer risk assessment incorporates additional factors, including personal medical history, family history, and any available genetic information that may result in a personalized plan for cancer prevention and surveillance.  RECOMMENDATIONS FOR FAMILY MEMBERS:  Individuals in this family  might be at some increased risk of developing cancer, over the general population risk, simply due to the family history of cancer.  We recommended women in this family have a yearly mammogram beginning at age 10, or 26 years younger than the earliest onset of cancer, an annual clinical breast exam, and perform monthly breast self-exams. Women in this family should also have a gynecological exam as recommended by their primary provider. All family members should have a colonoscopy by age 84.  FOLLOW-UP: Lastly, we discussed with [name] that cancer genetics is a rapidly advancing field and it is possible that new genetic tests will be appropriate for him and/or his family members in the future. We encouraged him to remain in contact with cancer genetics on an annual basis so we can update his personal and family histories and let him know of advances in cancer genetics that may benefit this family.   Our contact number was provided. [name] questions were answered to his satisfaction, and he knows he is welcome to call us at anytime with additional questions or concerns.   Roma Kayser, MS, [facility] Certified Genetic Counselor [email or web address]

## 2017-12-29 NOTE — Telephone Encounter (Signed)
Revealed negative genetic testing.  Discussed that we do not know why he has breast cancer or why there is cancer in the family. It could be due to a different gene that we are not testing, or maybe our current technology may not be able to pick something up.  It will be important for him to keep in contact with genetics to keep up with whether additional testing may be needed.  

## 2018-01-05 ENCOUNTER — Telehealth: Payer: Self-pay

## 2018-01-05 NOTE — Telephone Encounter (Signed)
-----   Message from Wyatt Portela, MD sent at 01/05/2018 11:48 AM EDT ----- Regarding: RE: status update Nothing to do now. Just monitor.  ----- Message ----- From: Scot Dock, RN Sent: 01/05/2018  11:42 AM EDT To: Wyatt Portela, MD Subject: status update                                  Patient wants you to know that since his last appointment he has had left thigh pain which is the same area and pain as prior to surgery. FYI

## 2018-01-05 NOTE — Telephone Encounter (Signed)
Contacted patient and made aware that Dr. Alen Blew will continue to monitor at this point but to keep [place] aware of any changes or concerns. Patient verbalized understanding and appreciative of the call.

## 2018-01-12 ENCOUNTER — Other Ambulatory Visit: Payer: Self-pay | Admitting: Oncology

## 2018-01-12 ENCOUNTER — Other Ambulatory Visit: Payer: Self-pay

## 2018-01-12 DIAGNOSIS — C61 Malignant neoplasm of prostate: Secondary | ICD-10-CM

## 2018-01-13 ENCOUNTER — Other Ambulatory Visit: Payer: Self-pay

## 2018-01-13 ENCOUNTER — Other Ambulatory Visit: Payer: Self-pay | Admitting: Oncology

## 2018-01-13 DIAGNOSIS — C61 Malignant neoplasm of prostate: Secondary | ICD-10-CM

## 2018-01-13 MED ORDER — ABIRATERONE ACETATE 500 MG PO TABS: ORAL_TABLET | ORAL | 0 refills | Status: DC

## 2018-01-20 MED FILL — ZYTIGA 500 MG TAB: 500 | 30 days supply | Qty: 60 | Fill #0

## 2018-02-13 ENCOUNTER — Other Ambulatory Visit: Payer: Self-pay | Admitting: Oncology

## 2018-02-13 DIAGNOSIS — C61 Malignant neoplasm of prostate: Secondary | ICD-10-CM

## 2018-02-14 ENCOUNTER — Inpatient Hospital Stay (HOSPITAL_BASED_OUTPATIENT_CLINIC_OR_DEPARTMENT_OTHER): Payer: Medicare Other | Admitting: Oncology

## 2018-02-14 ENCOUNTER — Telehealth: Payer: Self-pay | Admitting: Oncology

## 2018-02-14 ENCOUNTER — Inpatient Hospital Stay: Payer: Medicare Other

## 2018-02-14 ENCOUNTER — Inpatient Hospital Stay: Payer: Medicare Other | Attending: Oncology

## 2018-02-14 VITALS — BP 123/80 | HR 55 | Temp 98.2°F | Resp 18 | Ht 70.0 in | Wt 177.5 lb

## 2018-02-14 DIAGNOSIS — Z9079 Acquired absence of other genital organ(s): Secondary | ICD-10-CM

## 2018-02-14 DIAGNOSIS — Z7982 Long term (current) use of aspirin: Secondary | ICD-10-CM | POA: Diagnosis not present

## 2018-02-14 DIAGNOSIS — C7951 Secondary malignant neoplasm of bone: Secondary | ICD-10-CM | POA: Diagnosis not present

## 2018-02-14 DIAGNOSIS — R591 Generalized enlarged lymph nodes: Secondary | ICD-10-CM | POA: Insufficient documentation

## 2018-02-14 DIAGNOSIS — C61 Malignant neoplasm of prostate: Secondary | ICD-10-CM | POA: Diagnosis not present

## 2018-02-14 DIAGNOSIS — Z79899 Other long term (current) drug therapy: Secondary | ICD-10-CM | POA: Insufficient documentation

## 2018-02-14 DIAGNOSIS — Z1379 Encounter for other screening for genetic and chromosomal anomalies: Secondary | ICD-10-CM

## 2018-02-14 DIAGNOSIS — I10 Essential (primary) hypertension: Secondary | ICD-10-CM | POA: Diagnosis not present

## 2018-02-14 LAB — CMP (CANCER CENTER ONLY)
ALT: 27 U/L (ref 0–44)
ANION GAP: 9 (ref 5–15)
AST: 26 U/L (ref 15–41)
Albumin: 4.4 g/dL (ref 3.5–5.0)
Alkaline Phosphatase: 53 U/L (ref 38–126)
BUN: 19 mg/dL (ref 8–23)
CO2: 27 mmol/L (ref 22–32)
Calcium: 9.3 mg/dL (ref 8.9–10.3)
Chloride: 106 mmol/L (ref 98–111)
Creatinine: 0.8 mg/dL (ref 0.61–1.24)
GFR, Est AFR Am: >60 mL/min (ref >60)
GFR, Estimated: >60 mL/min (ref >60)
Glucose, Bld: 104 mg/dL — ABNORMAL HIGH (ref 70–99)
Potassium: 3.6 mmol/L (ref 3.5–5.1)
Sodium: 142 mmol/L (ref 135–145)
TOTAL PROTEIN: 6.8 g/dL (ref 6.5–8.1)
Total Bilirubin: 1.3 mg/dL — ABNORMAL HIGH (ref 0.3–1.2)

## 2018-02-14 LAB — CBC WITH DIFFERENTIAL (CANCER CENTER ONLY)
Abs Immature Granulocytes: 0.01 10*3/uL (ref 0.00–0.07)
BASOS PCT: 0 %
Basophils Absolute: 0 10*3/uL (ref 0.0–0.1)
Eosinophils Absolute: 0.1 10*3/uL (ref 0.0–0.5)
Eosinophils Relative: 2 %
HCT: 40.6 % (ref 39.0–52.0)
Hemoglobin: 13.9 g/dL (ref 13.0–17.0)
Immature Granulocytes: 0 %
Lymphocytes Relative: 35 %
Lymphs Abs: 2.3 10*3/uL (ref 0.7–4.0)
MCH: 31.9 pg (ref 26.0–34.0)
MCHC: 34.2 g/dL (ref 30.0–36.0)
MCV: 93.1 fL (ref 80.0–100.0)
Monocytes Absolute: 0.6 10*3/uL (ref 0.1–1.0)
Monocytes Relative: 9 %
NEUTROS PCT: 54 %
Neutro Abs: 3.4 10*3/uL (ref 1.7–7.7)
PLATELETS: 252 10*3/uL (ref 150–400)
RBC: 4.36 MIL/uL (ref 4.22–5.81)
RDW: 11.6 % (ref 11.5–15.5)
WBC Count: 6.4 10*3/uL (ref 4.0–10.5)
nRBC: 0 % (ref 0.0–0.2)

## 2018-02-14 MED ORDER — DENOSUMAB 120 MG/1.7ML ~~LOC~~ SOLN
120.0000 mg | Freq: Once | SUBCUTANEOUS | Status: AC
  Administered 2018-02-14: 120 mg via SUBCUTANEOUS

## 2018-02-14 MED ORDER — DENOSUMAB 120 MG/1.7ML ~~LOC~~ SOLN
SUBCUTANEOUS | Status: AC
  Filled 2018-02-14: qty 1.7

## 2018-02-14 NOTE — Progress Notes (Signed)
Hematology and Oncology Follow Up Visit  Ethan [name] [id number] 27-Jun-1950 67 y.o. 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  He was found to have Gleason score 4+5 = 9, PSA 542,   Prior Therapy:   He is status post orchiectomy completed by Dr. Jeffie Pollock on September 08, 2017.  Current therapy:  Zytiga 1000 mg daily with prednisone started in July 2019.  Interim History: [name] returns today for a repeat evaluation.  Since last visit, he reports no major changes in his health.  He continues to feel well and tolerates Zytiga without any complaints.  He denies any lower extremity edema, excessive fatigue or GI complications.  His appetite remain excellent and his performance status is unchanged.  Continues to exercise regularly and have intentionally lost weight.  He does not report any headaches, blurry vision, syncope or seizures.  Denies any alteration in mental status or confusion.  Does not report any fevers, chills or sweats.  Does not report any cough, wheezing or hemoptysis.  Does not report any chest pain, palpitation, orthopnea or leg edema.  Does not report any nausea, vomiting or early satiety. Does not report any constipation or diarrhea.  Does not report frequency, urgency or hematuria.  Does not report any ecchymosis or petechiae.  Does not report any heat or cold intolerance.  Denies any anxiety or depression.  Remaining review of systems is negative.    Medications: I have reviewed the patient's current medications.  Current Outpatient Medications  Medication Sig Dispense Refill  . abiraterone acetate (ZYTIGA) 500 MG tablet TAKE 2 TABLETS (1,000 MG TOTAL) BY MOUTH DAILY. TAKE ON AN EMPTY STOMACH 1 HOUR BEFORE OR 2 HOURS AFTER A MEAL. 60 tablet 0  . aspirin EC 81 MG tablet Take 81 mg by mouth daily.    .  Cholecalciferol (VITAMIN D-3) 1000 units CAPS Take by mouth daily.    Marland Kitchen losartan-hydrochlorothiazide (HYZAAR) 50-12.5 MG tablet Take 1 tablet by mouth every evening.     . Multiple Vitamin (MULTIVITAMIN) tablet Take 1 tablet by mouth daily.    . predniSONE (DELTASONE) 5 MG tablet Take 1 tablet (5 mg total) by mouth daily with breakfast. 90 tablet 3  . tamsulosin (FLOMAX) 0.4 MG CAPS capsule Take 0.4 mg by mouth daily after supper.    Marland Kitchen ZYTIGA 500 MG tablet TAKE 2 TABLETS (1,000 MG TOTAL) BY MOUTH DAILY. TAKE ON AN EMPTY STOMACH 1 HOUR BEFORE OR 2 HOURS AFTER A MEAL. 60 tablet 0   No current facility-administered medications for this visit.    Facility-Administered Medications Ordered in Other Visits  Medication Dose Route Frequency Provider Last Rate Last Dose  . denosumab (XGEVA) injection 120 mg  120 mg Subcutaneous Once Prynce Jacober, Mathis Dad, MD         Allergies: No Known Allergies  Past Medical History, Surgical history, Social history, and Family History were reviewed and updated.    Physical Exam:  Blood pressure 123/80, pulse (!) 55, temperature 98.2 F (36.8 C), temperature source Oral, resp. rate 18, height 5\' 10"  (1.778 m), weight 177 lb 8 oz (80.5 kg), SpO2 100 %.   ECOG: 0   General appearance: Alert, awake without any distress. Head: Atraumatic without abnormalities Oropharynx: Without any thrush or ulcers. Eyes: No scleral icterus. Lymph nodes: No lymphadenopathy noted in the cervical, supraclavicular, or axillary nodes Heart:regular rate  and rhythm, without any murmurs or gallops.   Lung: Clear to auscultation without any rhonchi, wheezes or dullness to percussion. Abdomin: Soft, nontender without any shifting dullness or ascites. Musculoskeletal: No clubbing or cyanosis. Neurological: No motor or sensory deficits. Skin: No rashes or lesions.       Lab Results: Lab Results  Component Value Date   WBC 7.0 12/15/2017   HGB 13.3 12/15/2017   HCT 38.8  12/15/2017   MCV 94.4 12/15/2017   PLT 262 12/15/2017     Chemistry      Component Value Date/Time   NA 143 12/15/2017 1136   K 4.3 12/15/2017 1136   CL 107 12/15/2017 1136   CO2 29 12/15/2017 1136   BUN 21 12/15/2017 1136   CREATININE 0.86 12/15/2017 1136      Component Value Date/Time   CALCIUM 9.6 12/15/2017 1136   ALKPHOS 70 12/15/2017 1136   AST 25 12/15/2017 1136   ALT 24 12/15/2017 1136   BILITOT 1.5 (H) 12/15/2017 1136     Results for [name], [name] (MRN [id number]) as of 02/14/2018 12:42  Ref. Range 10/27/2017 14:35 12/15/2017 11:36  Prostate Specific Ag, Serum Latest Ref Range: 0.0 - 4.0 ng/mL <0.1 18.2 (H)     Impression and Plan:   67 year old man with the following:  1.    Advanced prostate cancer that is currently castration-sensitive with PSA of 542 with disease in the bone and lymphadenopathy.    He is currently on Zytiga which he has tolerated very well.  He had an excellent PSA response from 542 down to 18.  Risks and benefits of continuing this treatment was reviewed today is agreeable to continue.  Alternative therapies as well as the natural course of this disease were reviewed.  He understands alternative therapy may be needed he develops castration resistant disease.   2.  Androgen deprivation: His testosterone will be checked periodically he is status post orchiectomy.  3.  Hypertension: Blood pressure remains within normal range at this time.  We will continue to monitor on Zytiga.  4.    Bone directed therapy: Risks and benefits of continuing Delton See was discussed today.  We will continue to receive it every 2 months.  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  5.  Prognosis and goals of care: Therapy remains palliative although his performance status remains excellent.  6.  Genetic counseling: This has been completed without any genetic variants detected.  7.  Follow-up: We will be in months  to follow his progress.  15 minutes was spent with the patient face-to-face today.  More than 50% of time was dedicated to reviewing his disease status, treatment options coordinating plan of care.     Zola Button, MD 12/3/201912:44 PM

## 2018-02-14 NOTE — Telephone Encounter (Signed)
Printed calendar and avs. °

## 2018-02-14 NOTE — Patient Instructions (Signed)

## 2018-02-15 ENCOUNTER — Telehealth: Payer: Self-pay | Admitting: *Deleted

## 2018-02-15 LAB — PROSTATE-SPECIFIC AG, SERUM (LABCORP): PROSTATE SPECIFIC AG, SERUM: 13.6 ng/mL — AB (ref 0.0–4.0)

## 2018-02-15 NOTE — Telephone Encounter (Signed)
-----   Message from Wyatt Portela, MD sent at 02/15/2018  7:25 AM EST ----- Please let him know his PSA is going down.

## 2018-02-15 NOTE — Telephone Encounter (Signed)
Per dr Alen Blew, spoke with patient, gave results of last PSA

## 2018-02-22 MED FILL — ZYTIGA 500 MG TAB: 500 | 30 days supply | Qty: 60 | Fill #0

## 2018-02-28 ENCOUNTER — Encounter: Payer: Self-pay | Admitting: Oncology

## 2018-03-02 ENCOUNTER — Other Ambulatory Visit: Payer: Self-pay | Admitting: *Deleted

## 2018-03-02 MED ORDER — TAMSULOSIN HCL 0.4 MG PO CAPS: 0.4000 mg | ORAL_CAPSULE | Freq: Every day | ORAL | 1 refills | Status: DC

## 2018-03-11 ENCOUNTER — Encounter: Payer: Self-pay | Admitting: Oncology

## 2018-03-13 ENCOUNTER — Telehealth: Payer: Self-pay | Admitting: Oncology

## 2018-03-13 NOTE — Telephone Encounter (Signed)
R/s lab appt per 12/30 sch message - pt is aware of appt date and time

## 2018-03-16 ENCOUNTER — Other Ambulatory Visit: Payer: Self-pay | Admitting: Oncology

## 2018-03-16 DIAGNOSIS — C61 Malignant neoplasm of prostate: Secondary | ICD-10-CM

## 2018-03-23 ENCOUNTER — Telehealth: Payer: Self-pay | Admitting: Pharmacist

## 2018-03-23 ENCOUNTER — Telehealth: Payer: Self-pay

## 2018-03-23 MED FILL — ZYTIGA 500 MG TAB: 500 | 30 days supply | Qty: 60 | Fill #0

## 2018-03-23 NOTE — Telephone Encounter (Signed)
Oral Oncology Pharmacist Encounter  Oral Oncology patient advocate secured copayment grant for patient today to help reduce out of pocket expenses of Zytiga to $0. Patient's wife, Cecille Rubin, had additional questions about expected length of therapy with Zytiga, as she tries to plan for ways to pay for the medication in the future.  Cecille Rubin informed that the plan is for Mr. Brideau to remain on Zytiga as long at it is still working and patient is tolerating it. We discussed available data for progression free survival and overall survival available for Zytiga.  We discussed copayment structure of medicare part D plans and available options for continued medication acquisition.  Lori expressed understanding and appreciation. She knows to call the office with any additional questions or concerns.  [name], PharmD, BCPS, BCOP  03/23/2018 3:28 PM Oral Oncology Clinic [phone]

## 2018-03-23 NOTE — Telephone Encounter (Signed)
Oral Oncology Patient Advocate Encounter  [name] notified me that the [employer] was not covering the whole copay.  I was successful at securing a 2nd grant with Patient [employer] for $7,300. This will keep the out of pocket expense for Zytiga at $0. The grant information is as follows and has been shared with [place].  Approval dates: 03/23/18-09/23/18 ID: [id number] Group: [id number] BIN: [id number] PCN: [id number]  I called the patient and gave him this information, he verbalized understanding and appreciation.   Ethan Patient [name] Phone [phone] Fax [phone]

## 2018-04-05 DIAGNOSIS — G4700 Insomnia, unspecified: Secondary | ICD-10-CM | POA: Diagnosis not present

## 2018-04-05 DIAGNOSIS — C61 Malignant neoplasm of prostate: Secondary | ICD-10-CM | POA: Diagnosis not present

## 2018-04-06 ENCOUNTER — Encounter: Payer: Self-pay | Admitting: Oncology

## 2018-04-06 ENCOUNTER — Other Ambulatory Visit: Payer: Self-pay

## 2018-04-14 ENCOUNTER — Inpatient Hospital Stay: Payer: Medicare Other | Attending: Oncology

## 2018-04-14 ENCOUNTER — Telehealth: Payer: Self-pay | Admitting: *Deleted

## 2018-04-14 ENCOUNTER — Other Ambulatory Visit: Payer: Self-pay | Admitting: Oncology

## 2018-04-14 ENCOUNTER — Encounter: Payer: Self-pay | Admitting: Oncology

## 2018-04-14 DIAGNOSIS — C61 Malignant neoplasm of prostate: Secondary | ICD-10-CM | POA: Insufficient documentation

## 2018-04-14 DIAGNOSIS — Z79899 Other long term (current) drug therapy: Secondary | ICD-10-CM | POA: Insufficient documentation

## 2018-04-14 DIAGNOSIS — R591 Generalized enlarged lymph nodes: Secondary | ICD-10-CM | POA: Insufficient documentation

## 2018-04-14 DIAGNOSIS — C7951 Secondary malignant neoplasm of bone: Secondary | ICD-10-CM | POA: Insufficient documentation

## 2018-04-14 DIAGNOSIS — Z9079 Acquired absence of other genital organ(s): Secondary | ICD-10-CM | POA: Insufficient documentation

## 2018-04-14 LAB — CBC WITH DIFFERENTIAL (CANCER CENTER ONLY)
Abs Immature Granulocytes: 0.02 10*3/uL (ref 0.00–0.07)
BASOS PCT: 0 %
Basophils Absolute: 0 10*3/uL (ref 0.0–0.1)
Eosinophils Absolute: 0.2 10*3/uL (ref 0.0–0.5)
Eosinophils Relative: 2 %
HCT: 42.3 % (ref 39.0–52.0)
Hemoglobin: 14.3 g/dL (ref 13.0–17.0)
Immature Granulocytes: 0 %
Lymphocytes Relative: 14 %
Lymphs Abs: 1.1 10*3/uL (ref 0.7–4.0)
MCH: 31.6 pg (ref 26.0–34.0)
MCHC: 33.8 g/dL (ref 30.0–36.0)
MCV: 93.6 fL (ref 80.0–100.0)
Monocytes Absolute: 0.5 10*3/uL (ref 0.1–1.0)
Monocytes Relative: 6 %
Neutro Abs: 6.1 10*3/uL (ref 1.7–7.7)
Neutrophils Relative %: 78 %
PLATELETS: 255 10*3/uL (ref 150–400)
RBC: 4.52 MIL/uL (ref 4.22–5.81)
RDW: 12.2 % (ref 11.5–15.5)
WBC Count: 7.9 10*3/uL (ref 4.0–10.5)
nRBC: 0 % (ref 0.0–0.2)

## 2018-04-14 LAB — CMP (CANCER CENTER ONLY)
ALK PHOS: 60 U/L (ref 38–126)
ALT: 23 U/L (ref 0–44)
AST: 22 U/L (ref 15–41)
Albumin: 4.5 g/dL (ref 3.5–5.0)
Anion gap: 7 (ref 5–15)
BILIRUBIN TOTAL: 0.7 mg/dL (ref 0.3–1.2)
BUN: 19 mg/dL (ref 8–23)
CO2: 27 mmol/L (ref 22–32)
Calcium: 9.1 mg/dL (ref 8.9–10.3)
Chloride: 108 mmol/L (ref 98–111)
Creatinine: 0.76 mg/dL (ref 0.61–1.24)
GFR, Est AFR Am: >60 mL/min (ref >60)
Glucose, Bld: 116 mg/dL — ABNORMAL HIGH (ref 70–99)
Potassium: 4.1 mmol/L (ref 3.5–5.1)
Sodium: 142 mmol/L (ref 135–145)
Total Protein: 7 g/dL (ref 6.5–8.1)

## 2018-04-14 NOTE — Telephone Encounter (Signed)
Spoke with patient ZM:[id number] list. zytiga listed twice. It can only be removed during a dr office encounter. He will remind the nurse, again.

## 2018-04-15 LAB — PROSTATE-SPECIFIC AG, SERUM (LABCORP): Prostate Specific Ag, Serum: 16.5 ng/mL — ABNORMAL HIGH (ref 0.0–4.0)

## 2018-04-15 LAB — TESTOSTERONE: Testosterone: <3 ng/dL — ABNORMAL LOW (ref 264–916)

## 2018-04-18 ENCOUNTER — Other Ambulatory Visit: Payer: Self-pay | Admitting: Pharmacist

## 2018-04-19 ENCOUNTER — Encounter: Payer: Self-pay | Admitting: Oncology

## 2018-04-20 ENCOUNTER — Telehealth: Payer: Self-pay | Admitting: Oncology

## 2018-04-20 ENCOUNTER — Inpatient Hospital Stay: Payer: Medicare Other | Attending: Oncology | Admitting: Oncology

## 2018-04-20 ENCOUNTER — Other Ambulatory Visit: Payer: Medicare Other

## 2018-04-20 ENCOUNTER — Inpatient Hospital Stay: Payer: Medicare Other

## 2018-04-20 VITALS — BP 138/72 | HR 59 | Temp 98.6°F | Resp 17 | Ht 70.0 in | Wt 174.1 lb

## 2018-04-20 DIAGNOSIS — Z9079 Acquired absence of other genital organ(s): Secondary | ICD-10-CM | POA: Diagnosis not present

## 2018-04-20 DIAGNOSIS — Z79899 Other long term (current) drug therapy: Secondary | ICD-10-CM | POA: Insufficient documentation

## 2018-04-20 DIAGNOSIS — I10 Essential (primary) hypertension: Secondary | ICD-10-CM | POA: Diagnosis not present

## 2018-04-20 DIAGNOSIS — C61 Malignant neoplasm of prostate: Secondary | ICD-10-CM | POA: Diagnosis not present

## 2018-04-20 DIAGNOSIS — C7951 Secondary malignant neoplasm of bone: Secondary | ICD-10-CM | POA: Diagnosis not present

## 2018-04-20 MED ORDER — DENOSUMAB 120 MG/1.7ML ~~LOC~~ SOLN
SUBCUTANEOUS | Status: AC
  Filled 2018-04-20: qty 1.7

## 2018-04-20 MED ORDER — DENOSUMAB 120 MG/1.7ML ~~LOC~~ SOLN
120.0000 mg | Freq: Once | SUBCUTANEOUS | Status: AC
  Administered 2018-04-20: 120 mg via SUBCUTANEOUS

## 2018-04-20 MED FILL — ZYTIGA 500 MG TAB: 500 | 30 days supply | Qty: 60 | Fill #0

## 2018-04-20 NOTE — Telephone Encounter (Signed)
Scheduled appt per 2/06 los. ° °Printed calendar and avs. °

## 2018-04-20 NOTE — Progress Notes (Signed)
Per Dr. Alen Blew ok to give Delton See today with lab results from 04/14/18 calcium level=9.1

## 2018-04-20 NOTE — Progress Notes (Signed)
Hematology and Oncology Follow Up Visit  Ethan [name] [id number] 30-Dec-1950 68 y.o. 04/20/2018 9:50 AM Ethan [name], MDRankins, [name], MD   Principle Diagnosis: 68 year old man with castration-sensitive prostate cancer diagnosed in June 2019.  He presented with Gleason score 4+5 = 9, PSA 542 and disease to the bone.  Prior Therapy:   He is status post orchiectomy completed by Dr. Jeffie Pollock on September 08, 2017.  Current therapy:  Zytiga 1000 mg daily with prednisone started in July 2019.  Xgeva on 120 mg every 4 to 6 weeks.  Interim History: Ethan [name] is here for a repeat evaluation.  Since the last visit, he reports no major changes in his health.  He has tolerated Zytiga without any issues or complaints.  He denies any nausea, fatigue or edema.  His performance status and quality of life remains unchanged.  His appetite is excellent and he has gained weight.  Denies any worsening bone pain.  Patient denied any alteration mental status, neuropathy, confusion or dizziness.  Denies any headaches or lethargy.  Denies any night sweats, weight loss or changes in appetite.  Denied orthopnea, dyspnea on exertion or chest discomfort.  Denies shortness of breath, difficulty breathing hemoptysis or cough.  Denies any abdominal distention, nausea, early satiety or dyspepsia.  Denies any hematuria, frequency, dysuria or nocturia.  Denies any skin irritation, dryness or rash.  Denies any ecchymosis or petechiae.  Denies any lymphadenopathy or clotting.  Denies any heat or cold intolerance.  Denies any anxiety or depression.  Remaining review of system is negative.       Medications: I have reviewed the patient's current medications.  Current Outpatient Medications  Medication Sig Dispense Refill  . Cholecalciferol (VITAMIN D-3) 1000 units CAPS Take by mouth daily.    . Multiple Vitamin (MULTIVITAMIN) tablet Take 1 tablet by mouth daily.    . predniSONE (DELTASONE) 5 MG tablet Take 1 tablet  (5 mg total) by mouth daily with breakfast. 90 tablet 3  . tamsulosin (FLOMAX) 0.4 MG CAPS capsule Take 1 capsule (0.4 mg total) by mouth daily after supper. 90 capsule 1  . zolpidem (AMBIEN) 10 MG tablet Take 0.5 tablets by mouth at bedtime as needed.    Marland Kitchen ZYTIGA 500 MG tablet TAKE 2 TABLETS (1,000 MG TOTAL) BY MOUTH DAILY. TAKE ON AN EMPTY STOMACH 1 HOUR BEFORE OR 2 HOURS AFTER A MEAL. 60 tablet 0   No current facility-administered medications for this visit.    Facility-Administered Medications Ordered in Other Visits  Medication Dose Route Frequency Provider Last Rate Last Dose  . denosumab (XGEVA) injection 120 mg  120 mg Subcutaneous Once Roczen Waymire, Mathis Dad, MD         Allergies: No Known Allergies  Past Medical History, Surgical history, Social history, and Family History were reviewed and updated.    Physical Exam:    ECOG: 0   General appearance: Comfortable appearing without any discomfort Head: Normocephalic without any trauma Oropharynx: Mucous membranes are moist and pink without any thrush or ulcers. Eyes: Pupils are equal and round reactive to light. Lymph nodes: No cervical, supraclavicular, inguinal or axillary lymphadenopathy.   Heart:regular rate and rhythm.  S1 and S2 without leg edema. Lung: Clear without any rhonchi or wheezes.  No dullness to percussion. Abdomin: Soft, nontender, nondistended with good bowel sounds.  No hepatosplenomegaly. Musculoskeletal: No joint deformity or effusion.  Full range of motion noted. Neurological: No deficits noted on motor, sensory and deep tendon reflex exam. Skin: No  petechial rash or dryness.  Appeared moist.  Psychiatric: Mood and affect appeared appropriate.        Lab Results: Lab Results  Component Value Date   WBC 7.9 04/14/2018   HGB 14.3 04/14/2018   HCT 42.3 04/14/2018   MCV 93.6 04/14/2018   PLT 255 04/14/2018     Chemistry      Component Value Date/Time   NA 142 04/14/2018 1041   K 4.1  04/14/2018 1041   CL 108 04/14/2018 1041   CO2 27 04/14/2018 1041   BUN 19 04/14/2018 1041   CREATININE 0.76 04/14/2018 1041      Component Value Date/Time   CALCIUM 9.1 04/14/2018 1041   ALKPHOS 60 04/14/2018 1041   AST 22 04/14/2018 1041   ALT 23 04/14/2018 1041   BILITOT 0.7 04/14/2018 1041      Results for [name], [name] (MRN [id number]) as of 04/20/2018 09:50  Ref. Range 12/15/2017 11:36 02/14/2018 12:35 04/14/2018 10:41  Prostate Specific Ag, Serum Latest Ref Range: 0.0 - 4.0 ng/mL 18.2 (H) 13.6 (H) 16.5 (H)     Impression and Plan:   68 year old man with the following:  1.    Castration-sensitive prostate cancer with bone disease and lymphadenopathy documented in June 2019.  He presented with with PSA of 542.   He tolerated Zytiga reasonably well with excellent response overall his PSA.  His PSA currently at up to 16.5 after drop down to 13.6.  The ramification of this change was discussed today and future treatment options were also reviewed.  As long as his PSA remains relatively low and fluctuating no change of therapy will be needed.  Additional therapy such as systemic chemotherapy or Gillermina Phy may be needed if his PSA starts to rise.  It would be an indication of a rather aggressive disease that is transforming to castration-resistant disease rather quickly.   2.  Androgen deprivation: His testosterone is very low indicating adequate castration at this time.  3.  Hypertension: No issues reported with his blood pressure at this time.  4.    Bone directed therapy: He remains on Xgeva which she will receive today and every 4 to 8 weeks.  Complications including osteonecrosis of the jaw and hypocalcemia were also reviewed.  5.  Prognosis and goals of care: His disease is incurable but therapy remains palliative at this time  6.  Follow-up: We will be in 1 month to repeat a PSA given the recent rise.  25 minutes was spent with the patient face-to-face today.  More  than 50% of time was dedicated to reviewing his laboratory data, differential diagnosis and management strategies.    Zola Button, MD 2/6/20209:50 AM

## 2018-05-05 ENCOUNTER — Emergency Department (HOSPITAL_COMMUNITY): Payer: Medicare Other

## 2018-05-05 ENCOUNTER — Encounter (HOSPITAL_COMMUNITY): Payer: Self-pay

## 2018-05-05 ENCOUNTER — Other Ambulatory Visit: Payer: Self-pay

## 2018-05-05 ENCOUNTER — Emergency Department (HOSPITAL_COMMUNITY)
Admission: EM | Admit: 2018-05-05 | Discharge: 2018-05-05 | Disposition: A | Discharge status: Home / Self Care | Payer: Medicare Other | Attending: Emergency Medicine | Admitting: Emergency Medicine

## 2018-05-05 ENCOUNTER — Encounter: Payer: Self-pay | Admitting: Oncology

## 2018-05-05 DIAGNOSIS — S42292A Other displaced fracture of upper end of left humerus, initial encounter for closed fracture: Secondary | ICD-10-CM | POA: Diagnosis not present

## 2018-05-05 DIAGNOSIS — Z8546 Personal history of malignant neoplasm of prostate: Secondary | ICD-10-CM | POA: Insufficient documentation

## 2018-05-05 DIAGNOSIS — S42212A Unspecified displaced fracture of surgical neck of left humerus, initial encounter for closed fracture: Secondary | ICD-10-CM | POA: Diagnosis not present

## 2018-05-05 DIAGNOSIS — Y929 Unspecified place or not applicable: Secondary | ICD-10-CM | POA: Diagnosis not present

## 2018-05-05 DIAGNOSIS — W000XXA Fall on same level due to ice and snow, initial encounter: Secondary | ICD-10-CM | POA: Diagnosis not present

## 2018-05-05 DIAGNOSIS — I10 Essential (primary) hypertension: Secondary | ICD-10-CM | POA: Diagnosis not present

## 2018-05-05 DIAGNOSIS — Y999 Unspecified external cause status: Secondary | ICD-10-CM | POA: Diagnosis not present

## 2018-05-05 DIAGNOSIS — Y93K1 Activity, walking an animal: Secondary | ICD-10-CM | POA: Insufficient documentation

## 2018-05-05 DIAGNOSIS — W19XXXA Unspecified fall, initial encounter: Secondary | ICD-10-CM | POA: Diagnosis not present

## 2018-05-05 DIAGNOSIS — M25519 Pain in unspecified shoulder: Secondary | ICD-10-CM | POA: Diagnosis not present

## 2018-05-05 DIAGNOSIS — S4992XA Unspecified injury of left shoulder and upper arm, initial encounter: Secondary | ICD-10-CM | POA: Diagnosis present

## 2018-05-05 DIAGNOSIS — M79603 Pain in arm, unspecified: Secondary | ICD-10-CM | POA: Diagnosis not present

## 2018-05-05 DIAGNOSIS — R52 Pain, unspecified: Secondary | ICD-10-CM | POA: Diagnosis not present

## 2018-05-05 MED ORDER — OXYCODONE HCL 5 MG PO TABS: 5.0000 mg | ORAL_TABLET | ORAL | 0 refills | Status: DC | PRN

## 2018-05-05 MED ORDER — HYDROMORPHONE HCL 1 MG/ML IJ SOLN
0.5000 mg | Freq: Once | INTRAMUSCULAR | Status: AC
  Administered 2018-05-05: 0.5 mg via INTRAVENOUS
  Filled 2018-05-05: qty 1

## 2018-05-05 MED ORDER — OXYCODONE HCL 5 MG PO TABS
5.0000 mg | ORAL_TABLET | Freq: Once | ORAL | Status: AC
  Administered 2018-05-05: 5 mg via ORAL
  Filled 2018-05-05: qty 1

## 2018-05-05 NOTE — ED Provider Notes (Signed)
[facility] Emergency Department Provider Note MRN:  [id number]  Arrival date & time: 05/05/18     Chief Complaint   Fall   History of Present Illness   Ethan [name] is a 68 y.o. year-old male with a history of prostate cancer with bony metastasis presenting to the ED with chief complaint of shoulder pain.  Patient was walking the dog this morning when he slipped on some ice and fell onto his left shoulder.  Difficulty moving the shoulder since the fall due to severe pain.  Denies head trauma or loss of consciousness, no anticoagulation, no neck pain, no back pain, no chest pain, no shortness of breath, no abdominal pain, no pain to the legs or right arm.  Symptoms are constant, worse with motion, worse with palpation.  Review of Systems  A complete 10 system review of systems was obtained and all systems are negative except as noted in the HPI and PMH.   Patient's Health History    Past Medical History:  Diagnosis Date  . Hypertension   . 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 Wears glasses     Past Surgical History:  Procedure Laterality Date  . APPENDECTOMY  1963  . COLONOSCOPY WITH PROPOFOL  2017  . ORCHIECTOMY Bilateral 09/08/2017   Procedure: ORCHIECTOMY;  Surgeon: Irine Seal, MD;  Location: [facility]  Service: Urology;  Laterality: Bilateral;    Family History  Problem Relation Age of Onset  . Dementia Mother   . Kidney failure Father   . Lung cancer Maternal Aunt   . Lung cancer Maternal Uncle   . Lung cancer Paternal Uncle   . Lung cancer Maternal Grandmother   . Heart attack Paternal Grandfather   . Congestive Heart Failure Brother     Social History   Socioeconomic History  . Marital status: Married    Spouse name: Not on file  .  Number of children: Not on file  . Years of education: Not on file  . Highest education level: Not on file  Occupational History  . Not on file  Social Needs  . Financial resource strain: Not on file  . Food insecurity:    Worry: Not on file    Inability: Not on file  . Transportation needs:    Medical: Not on file    Non-medical: Not on file  Tobacco Use  . Smoking status: Former Smoker    Years: 20.00    Types: Cigarettes    Last attempt to quit: 09/08/2006    Years since quitting: 11.6  . Smokeless tobacco: Never Used  Substance and Sexual Activity  . Alcohol use: Yes    Alcohol/week: 7.0 standard drinks    Types: 7 Standard drinks or equivalent per week    Comment: 1 drink per day  . Drug use: Not on file  . Sexual activity: Not on file  Lifestyle  . Physical activity:    Days per week: Not on file    Minutes per session: Not on file  . Stress: Not on file  Relationships  . Social connections:    Talks on phone: Not on file    Gets together: Not on file    Attends religious service: Not on file  Active member of club or organization: Not on file    Attends meetings of clubs or organizations: Not on file    Relationship status: Not on file  . Intimate partner violence:    Fear of current or ex partner: Not on file    Emotionally abused: Not on file    Physically abused: Not on file    Forced sexual activity: Not on file  Other Topics Concern  . Not on file  Social History Narrative  . Not on file     Physical Exam  Vital Signs and Nursing Notes reviewed Vitals:   05/05/18 1307 05/05/18 1526  BP: 135/79 115/80  Pulse: 64   Resp: 16 16  Temp:    SpO2: 98% 99%    CONSTITUTIONAL: Well-appearing, NAD NEURO:  Alert and oriented x 3, no focal deficits EYES:  eyes equal and reactive ENT/NECK:  no LAD, no JVD CARDIO: Regular rate, well-perfused, normal S1 and S2 PULM:  CTAB no wheezing or rhonchi GI/GU:  normal bowel sounds, non-distended,  non-tender MSK/SPINE:  No gross deformities, no edema; significant tenderness palpation to the left shoulder, reduced range of motion due to pain, strong radial pulse, neurovascularly intact distally. SKIN:  no rash, atraumatic PSYCH:  Appropriate speech and behavior  Diagnostic and Interventional Summary    Labs Reviewed - No data to display  CT SHOULDER LEFT WO CONTRAST  Final Result    DG Shoulder Left  Final Result    DG Humerus Left  Final Result      Medications  HYDROmorphone (DILAUDID) injection 0.5 mg (0.5 mg Intravenous Given 05/05/18 1124)  HYDROmorphone (DILAUDID) injection 0.5 mg (0.5 mg Intravenous Given 05/05/18 1147)  HYDROmorphone (DILAUDID) injection 0.5 mg (0.5 mg Intravenous Given 05/05/18 1250)  oxyCODONE (Oxy IR/ROXICODONE) immediate release tablet 5 mg (5 mg Oral Given 05/05/18 1458)     Procedures Critical Care  ED Course and Medical Decision Making  I have reviewed the triage vital signs and the nursing notes.  Pertinent labs & imaging results that were available during my care of the patient were reviewed by me and considered in my medical decision making (see below for details).  Fracture versus dislocation in this 68 year old male unable to move shoulder due to severe pain after fall.  Exam is otherwise reassuring with nothing to suggest significant traumatic injuries elsewhere.  IV Dilaudid, x-rays pending.  X-ray reveals comminuted, angulated, displaced proximal humerus fracture, discussed with orthopedic surgery, who ordered CT imaging for further surgical planning.  Patient was placed in an immobilizer here in the ED with improvement in his pain.  Will follow-up with Dr. Doreatha Martin early this week.  After the discussed management above, the patient was determined to be safe for discharge.  The patient was in agreement with this plan and all questions regarding their care were answered.  ED return precautions were discussed and the patient will return to  the ED with any significant worsening of condition.  Barth Kirks. Sedonia Small, MD Iraan [email or web address]  Final Clinical Impressions(s) / ED Diagnoses     ICD-10-CM   1. Other closed displaced fracture of proximal end of left humerus, initial encounter S42.292A     ED Discharge Orders         Ordered    oxyCODONE (ROXICODONE) 5 MG immediate release tablet  Every 4 hours PRN     05/05/18 1444             Arta Stump,  Barth Kirks, MD 05/05/18 [phone]

## 2018-05-05 NOTE — ED Notes (Signed)
Patient transported to CT 

## 2018-05-05 NOTE — ED Triage Notes (Signed)
Per EMS: Pt walking dog in snow with wife.  Pt slipped and fell on left arm/shoulder.  Pt hx of prostate CA with mets to bone.

## 2018-05-05 NOTE — Discharge Instructions (Addendum)
You were evaluated in the Emergency Department and after careful evaluation, we did not find any emergent condition requiring admission or further testing in the hospital.  Your symptoms today seem to be due to a broken bone in your left arm, specifically the humerus.  Please call the office of Dr. headaches of orthopedic surgery today or Monday, they are anticipating seeing you in clinic on Tuesday but this needs to be formally scheduled.  We are providing you with stronger pain medication at home to help you with your pain until you can be seen in clinic.  Your injury will likely require surgery and so it is very important that you follow-up with an orthopedic doctor.  Please return to the Emergency Department if you experience any worsening of your condition.  We encourage you to follow up with a primary care provider.  Thank you for allowing [place] to be a part of your care.

## 2018-05-05 NOTE — ED Notes (Signed)
Bed: WA21 Expected date:  Expected time:  Means of arrival:  Comments: EMS fall  

## 2018-05-08 ENCOUNTER — Telehealth: Payer: Self-pay | Admitting: *Deleted

## 2018-05-08 NOTE — Telephone Encounter (Signed)
Spoke with wife, per dr Alen Blew, nothing to do from oncologist standpoint. Just follow instructions from ortho MD when patient sees him on 05/10/2018.

## 2018-05-10 DIAGNOSIS — S42301A Unspecified fracture of shaft of humerus, right arm, initial encounter for closed fracture: Secondary | ICD-10-CM | POA: Diagnosis not present

## 2018-05-11 ENCOUNTER — Encounter (HOSPITAL_COMMUNITY): Payer: Self-pay

## 2018-05-11 NOTE — H&P (Signed)
Orthopaedic Trauma Service (OTS) Consult   Patient ID: Ethan [name] MRN: [id number] DOB/AGE: [birth year] 68 y.o.  Urology: Dr. Jeffie Pollock Heme/Onc: Dr. Zola Button PCP: Dr. Milagros Evener    HPI: Ethan [name] is an 68 y.o. 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  Patient landed on his nondominant left arm.  Patient had immediate onset of pain and deformity.  He presented to [facility] emergency department with his wife where he was found to have a comminuted left proximal humerus fracture with shaft extension.  He was referred to the orthopedic trauma service for follow-up treatment and definitive care.  He was discharged from [place] in a sling and was given oxycodone for pain control.  He ran out over the weekend and PCP gave him Norco which has been less effective.  Patient has essentially been wheelchair-bound since his incident as it is very painful to ambulate due to motion at his fracture site.  He does have some transient numbness and tingling in his left hand and fingers but not too severe.  He does report some mild dizziness as well as decreased appetite and constipation denies any fevers or chills no other issues reported denies any additional injuries elsewhere.  Again patient has a medical history notable for metastatic prostate cancer status post orchiectomy he follows up regularly with urology and oncology.  He is on vitamin D and calcium supplementation as well as Zytiga and Xgeva.  Patient lives with his wife.  He is retired but does have many hobbies including gardening walking and riding his motorcycle  Remote smoking history currently does not smoke.  No other drugs.  Social drinker  Past Medical History:  Diagnosis Date  . Hypertension   . 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 Prostate  cancer metastatic to multiple sites [facility]) urologist-  dr Jeffie Pollock   dx 08-25-2017--  Gleason 9, PSA 542,  vol 67cc--  METs to bone (multiple sites) and lymph nodes  . Wears glasses     Past Surgical History:  Procedure Laterality Date  . APPENDECTOMY  1963  . COLONOSCOPY WITH PROPOFOL  2017  . ORCHIECTOMY Bilateral 09/08/2017   Procedure: ORCHIECTOMY;  Surgeon: Irine Seal, MD;  Location: [facility];  Service: Urology;  Laterality: Bilateral;    Family History  Problem Relation Age of Onset  . Dementia Mother   . Kidney failure Father   . Lung cancer Maternal Aunt   . Lung cancer Maternal Uncle   . Lung cancer Paternal Uncle   . Lung cancer Maternal Grandmother   . Heart attack Paternal Grandfather   . Congestive Heart Failure Brother     Social History:  reports that he quit smoking about 11 years ago. His smoking use included cigarettes. He quit after 20.00 years of use. He has never used smokeless tobacco. He reports current alcohol use of about 7.0 standard drinks of alcohol per week. No history on file for drug.  Allergies: No Known Allergies  Medications:  Prior to Admission:  Medications Prior to Admission  Medication Sig Dispense Refill Last Dose  . Calcium Carb-Cholecalciferol (CALTRATE 600+D3 PO) Take 2 tablets by mouth daily.   05/11/2018 at Unknown time  . Multiple Vitamin (MULTIVITAMIN) tablet Take 1 tablet by mouth daily.   05/11/2018 at Unknown time  . oxyCODONE (ROXICODONE) 5 MG immediate  release tablet Take 1 tablet (5 mg total) by mouth every 4 (four) hours as needed for severe pain. 20 tablet 0 05/12/2018 at Unknown time  . oxyCODONE-acetaminophen (PERCOCET/ROXICET) 5-325 MG tablet Take 1-2 tablets by mouth every 6 (six) hours as needed for severe pain (pain).   05/12/2018 at 0430  . predniSONE (DELTASONE) 5 MG tablet Take 1 tablet (5 mg total) by mouth daily with breakfast. 90 tablet 3 05/11/2018 at Unknown time  . tamsulosin (FLOMAX) 0.4 MG CAPS  capsule Take 1 capsule (0.4 mg total) by mouth daily after supper. 90 capsule 1 05/11/2018 at Unknown time  . zolpidem (AMBIEN) 10 MG tablet Take 5 mg by mouth at bedtime as needed for sleep.    Past Week at Unknown time  . ZYTIGA 500 MG tablet TAKE 2 TABLETS (1,000 MG TOTAL) BY MOUTH DAILY. TAKE ON AN EMPTY STOMACH 1 HOUR BEFORE OR 2 HOURS AFTER A MEAL. (Patient taking differently: Take 1,000 mg by mouth daily. ) 60 tablet 0 05/12/2018 at 0200    No results found for this or any previous visit (from the past 48 hour(s)).  No results found.  Review of Systems  Constitutional: Negative for chills and fever.  Respiratory: Negative for shortness of breath and wheezing.   Cardiovascular: Negative for chest pain and palpitations.  Gastrointestinal: Positive for nausea.  Neurological: Positive for tingling.   There were no vitals taken for this visit. Physical Exam Constitutional:      General: He is not in acute distress.    Appearance: Normal appearance. He is normal weight.  HENT:     Head: Normocephalic and atraumatic.     Mouth/Throat:     Mouth: Mucous membranes are moist.     Pharynx: Oropharynx is clear.  Eyes:     Extraocular Movements: Extraocular movements intact.  Neck:     Musculoskeletal: Normal range of motion. No neck rigidity or muscular tenderness.  Cardiovascular:     Rate and Rhythm: Normal rate and regular rhythm.  Pulmonary:     Effort: Pulmonary effort is normal.     Breath sounds: Normal breath sounds.  Abdominal:     General: Bowel sounds are normal.     Palpations: Abdomen is soft.  Musculoskeletal:     Comments: Left upper extremity Significant swelling and ecchymosis noted to the left upper arm Tenderness palpation left proximal humerus. Did not manipulate left shoulder. Clavicle is nontender No tenderness or deformity noted along the AC or [place] joints No significant tenderness palpation of his elbow, forearm, wrist or hand No significant swelling  distally No traumatic wounds Radial, ulnar, median, axillary nerve sensory functions are intact Radial, ulnar, median, AIN, PIN motor functions are intact I did not manipulate his elbow to evaluate range of motion due to humerus fracture Extremity is warm + Radial pulse Good color distally and good perfusion  Skin:    General: Skin is warm and dry.     Capillary Refill: Capillary refill takes less than 2 seconds.  Neurological:     Mental Status: He is alert and oriented to person, place, and time.     Comments: Did not assess coordination and gait  Psychiatric:        Mood and Affect: Mood normal.        Behavior: Behavior normal.        Thought Content: Thought content normal.    Imaging  CT scan and plain films of left humerus  Comminuted left proximal humerus fracture  with shaft extension and varus malalignment   Assessment/Plan:  68 year old male with a history of metastatic prostate cancer with low energy left proximal humerus fracture  -Left proximal humerus fracture with shaft extension  We recommend plate osteosynthesis of his fracture to restore length, alignment and stability.  We do feel that this will give him the best opportunity to return to baseline function.  Patient is in agreement with this plan and we will proceed with this.  Risks and benefits have been reviewed with the patient and he wishes to proceed.   Patient has had an orchiectomy as well as being on antiandrogen therapy and daily prednison to treat his Prostate CA.  This does contribute to the possibility of poor bone quality.  He is on oncologic doses of denosumab for bone metastases.   Will need close monitoring of his bone health which his oncologist is mindful of  - Pain management:  Titrate accordingly postoperatively  - DVT/PE prophylaxis:  May place him on 2-week course of Lovenox given his cancer history - ID:   Perioperative antibiotics - Metabolic Bone Disease:  As above  -  Impediments to fracture healing:  Chronic medical condition and associated treatment protocol - Dispo:  OR for ORIF left proximal humerus and humeral shaft  Anticipate overnight stay and discharge in the morning     Jari Pigg, PA-C [phone] (C) 05/11/2018, 12:45 PM  [facility] [place]  [phone] Jenetta [phone] (F)    I saw and examined the patient with Mr. Eddie Dibbles, communicating the findings and plan noted above.  I discussed with the patient and his wife the risks and benefits of surgery for his left shoulder, including the possibility of infection, nerve injury, vessel injury, wound breakdown, arthritis, symptomatic hardware, DVT/ PE, loss of motion, malunion, nonunion, and need for further surgery among others.  We also specifically discussed the obtaining a bone specimen for pathology.  He acknowledged these risks and wished to proceed.   Altamese Steuben, MD [facility] [phone] (p)

## 2018-05-11 NOTE — Anesthesia Preprocedure Evaluation (Signed)
Anesthesia Evaluation  Patient identified by MRN, date of birth, ID band Patient awake    Reviewed: Allergy & Precautions, NPO status , Patient's Chart, lab work & pertinent test results  Airway Mallampati: II  TM Distance: >3 FB Neck ROM: Full    Dental no notable dental hx. (+) Teeth Intact, Dental Advisory Given   Pulmonary former smoker,    Pulmonary exam normal breath sounds clear to auscultation       Cardiovascular hypertension, Pt. on medications Normal cardiovascular exam Rhythm:Regular Rate:Normal  ECG: NSR, rate 72   Neuro/Psych negative neurological ROS  negative psych ROS   GI/Hepatic negative GI ROS, Neg liver ROS,   Endo/Other  negative endocrine ROS  Renal/GU negative Renal ROS     Musculoskeletal negative musculoskeletal ROS (+)   Abdominal   Peds  Hematology negative hematology ROS (+)   Anesthesia Other Findings PROSTATE CANCER  Reproductive/Obstetrics                             Anesthesia Physical  Anesthesia Plan  ASA: II  Anesthesia Plan: General   Post-op Pain Management: GA combined w/ Regional for post-op pain   Induction: Intravenous  PONV Risk Score and Plan: 2 and Ondansetron, Dexamethasone and Treatment may vary due to age or medical condition  Airway Management Planned: Oral ETT  Additional Equipment: None  Intra-op Plan:   Post-operative Plan: Extubation in OR  Informed Consent: I have reviewed the patients History and Physical, chart, labs and discussed the procedure including the risks, benefits and alternatives for the proposed anesthesia with the patient or authorized representative who has indicated his/her understanding and acceptance.     Dental advisory given  Plan Discussed with: CRNA  Anesthesia Plan Comments:         Anesthesia Quick Evaluation

## 2018-05-12 ENCOUNTER — Other Ambulatory Visit: Payer: Self-pay

## 2018-05-12 ENCOUNTER — Ambulatory Visit (HOSPITAL_COMMUNITY): Payer: Medicare Other

## 2018-05-12 ENCOUNTER — Other Ambulatory Visit: Payer: Self-pay | Admitting: Oncology

## 2018-05-12 ENCOUNTER — Encounter (HOSPITAL_COMMUNITY): Payer: Self-pay

## 2018-05-12 ENCOUNTER — Encounter (HOSPITAL_COMMUNITY)
Admission: RE | Disposition: A | Discharge status: Home / Self Care | Payer: Self-pay | Source: Home / Self Care | Attending: Orthopedic Surgery

## 2018-05-12 ENCOUNTER — Ambulatory Visit (HOSPITAL_COMMUNITY): Payer: Medicare Other | Admitting: Anesthesiology

## 2018-05-12 ENCOUNTER — Ambulatory Visit (HOSPITAL_COMMUNITY)
Admission: RE | Admit: 2018-05-12 | Discharge: 2018-05-13 | Disposition: A | Discharge status: Home / Self Care | Payer: Medicare Other | Attending: Orthopedic Surgery | Admitting: Orthopedic Surgery

## 2018-05-12 DIAGNOSIS — S42212A Unspecified displaced fracture of surgical neck of left humerus, initial encounter for closed fracture: Secondary | ICD-10-CM | POA: Insufficient documentation

## 2018-05-12 DIAGNOSIS — I10 Essential (primary) hypertension: Secondary | ICD-10-CM | POA: Insufficient documentation

## 2018-05-12 DIAGNOSIS — Z01818 Encounter for other preprocedural examination: Secondary | ICD-10-CM

## 2018-05-12 DIAGNOSIS — C7951 Secondary malignant neoplasm of bone: Secondary | ICD-10-CM | POA: Insufficient documentation

## 2018-05-12 DIAGNOSIS — S42352A Displaced comminuted fracture of shaft of humerus, left arm, initial encounter for closed fracture: Secondary | ICD-10-CM | POA: Diagnosis not present

## 2018-05-12 DIAGNOSIS — Z993 Dependence on wheelchair: Secondary | ICD-10-CM | POA: Diagnosis not present

## 2018-05-12 DIAGNOSIS — C61 Malignant neoplasm of prostate: Secondary | ICD-10-CM | POA: Diagnosis not present

## 2018-05-12 DIAGNOSIS — S42351A Displaced comminuted fracture of shaft of humerus, right arm, initial encounter for closed fracture: Secondary | ICD-10-CM | POA: Diagnosis not present

## 2018-05-12 DIAGNOSIS — K5900 Constipation, unspecified: Secondary | ICD-10-CM | POA: Insufficient documentation

## 2018-05-12 DIAGNOSIS — Z419 Encounter for procedure for purposes other than remedying health state, unspecified: Secondary | ICD-10-CM

## 2018-05-12 DIAGNOSIS — W19XXXA Unspecified fall, initial encounter: Secondary | ICD-10-CM | POA: Diagnosis not present

## 2018-05-12 DIAGNOSIS — S42202A Unspecified fracture of upper end of left humerus, initial encounter for closed fracture: Secondary | ICD-10-CM | POA: Diagnosis not present

## 2018-05-12 DIAGNOSIS — R296 Repeated falls: Secondary | ICD-10-CM | POA: Diagnosis not present

## 2018-05-12 DIAGNOSIS — Z87891 Personal history of nicotine dependence: Secondary | ICD-10-CM | POA: Diagnosis not present

## 2018-05-12 DIAGNOSIS — Z01811 Encounter for preprocedural respiratory examination: Secondary | ICD-10-CM | POA: Diagnosis not present

## 2018-05-12 DIAGNOSIS — Z79899 Other long term (current) drug therapy: Secondary | ICD-10-CM | POA: Diagnosis not present

## 2018-05-12 DIAGNOSIS — S42211A Unspecified displaced fracture of surgical neck of right humerus, initial encounter for closed fracture: Secondary | ICD-10-CM | POA: Diagnosis not present

## 2018-05-12 HISTORY — PX: ORIF HUMERUS FRACTURE: SHX2126

## 2018-05-12 LAB — CBC WITH DIFFERENTIAL/PLATELET
Abs Immature Granulocytes: 0.03 10*3/uL (ref 0.00–0.07)
BASOS ABS: 0 10*3/uL (ref 0.0–0.1)
Basophils Relative: 0 %
EOS PCT: 5 %
Eosinophils Absolute: 0.3 10*3/uL (ref 0.0–0.5)
HCT: 36.7 % — ABNORMAL LOW (ref 39.0–52.0)
Hemoglobin: 12 g/dL — ABNORMAL LOW (ref 13.0–17.0)
Immature Granulocytes: 0 %
Lymphocytes Relative: 22 %
Lymphs Abs: 1.6 10*3/uL (ref 0.7–4.0)
MCH: 31.2 pg (ref 26.0–34.0)
MCHC: 32.7 g/dL (ref 30.0–36.0)
MCV: 95.3 fL (ref 80.0–100.0)
Monocytes Absolute: 0.5 10*3/uL (ref 0.1–1.0)
Monocytes Relative: 7 %
Neutro Abs: 4.7 10*3/uL (ref 1.7–7.7)
Neutrophils Relative %: 66 %
Platelets: 252 10*3/uL (ref 150–400)
RBC: 3.85 MIL/uL — ABNORMAL LOW (ref 4.22–5.81)
RDW: 12.2 % (ref 11.5–15.5)
WBC: 7.2 10*3/uL (ref 4.0–10.5)
nRBC: 0 % (ref 0.0–0.2)

## 2018-05-12 LAB — COMPREHENSIVE METABOLIC PANEL
ALT: 22 U/L (ref 0–44)
AST: 31 U/L (ref 15–41)
Albumin: 3.5 g/dL (ref 3.5–5.0)
Alkaline Phosphatase: 49 U/L (ref 38–126)
Anion gap: 9 (ref 5–15)
BUN: 20 mg/dL (ref 8–23)
CO2: 26 mmol/L (ref 22–32)
Calcium: 8.3 mg/dL — ABNORMAL LOW (ref 8.9–10.3)
Chloride: 103 mmol/L (ref 98–111)
Creatinine, Ser: 0.69 mg/dL (ref 0.61–1.24)
GFR calc Af Amer: >60 mL/min (ref >60)
GFR calc non Af Amer: >60 mL/min (ref >60)
Glucose, Bld: 132 mg/dL — ABNORMAL HIGH (ref 70–99)
Potassium: 3.3 mmol/L — ABNORMAL LOW (ref 3.5–5.1)
Sodium: 138 mmol/L (ref 135–145)
Total Bilirubin: 1.3 mg/dL — ABNORMAL HIGH (ref 0.3–1.2)
Total Protein: 5.8 g/dL — ABNORMAL LOW (ref 6.5–8.1)

## 2018-05-12 LAB — APTT: aPTT: 28 seconds (ref 24–36)

## 2018-05-12 LAB — PROTIME-INR
INR: 1 (ref 0.8–1.2)
Prothrombin Time: 13.5 seconds (ref 11.4–15.2)

## 2018-05-12 SURGERY — OPEN REDUCTION INTERNAL FIXATION (ORIF) PROXIMAL HUMERUS FRACTURE
Anesthesia: General | Site: Arm Upper | Laterality: Left

## 2018-05-12 MED ORDER — METOCLOPRAMIDE HCL 5 MG/ML IJ SOLN: 5.0000 mg | Freq: Three times a day (TID) | INTRAMUSCULAR | Status: DC | PRN

## 2018-05-12 MED ORDER — FENTANYL CITRATE (PF) 100 MCG/2ML IJ SOLN: 25.0000 ug | INTRAMUSCULAR | Status: DC | PRN

## 2018-05-12 MED ORDER — DIPHENHYDRAMINE HCL 12.5 MG/5ML PO ELIX: 12.5000 mg | ORAL_SOLUTION | ORAL | Status: DC | PRN

## 2018-05-12 MED ORDER — POLYETHYLENE GLYCOL 3350 17 G PO PACK
17.0000 g | PACK | Freq: Every day | ORAL | Status: DC
  Administered 2018-05-12: 17 g via ORAL
  Filled 2018-05-12: qty 1

## 2018-05-12 MED ORDER — LACTATED RINGERS IV SOLN
INTRAVENOUS | Status: DC
  Administered 2018-05-12 (×2): via INTRAVENOUS

## 2018-05-12 MED ORDER — PROMETHAZINE HCL 25 MG/ML IJ SOLN: 6.2500 mg | INTRAMUSCULAR | Status: DC | PRN

## 2018-05-12 MED ORDER — ALBUMIN HUMAN 5 % IV SOLN
INTRAVENOUS | Status: DC | PRN
  Administered 2018-05-12: 09:00:00 via INTRAVENOUS

## 2018-05-12 MED ORDER — CEFAZOLIN SODIUM-DEXTROSE 1-4 GM/50ML-% IV SOLN
1.0000 g | Freq: Four times a day (QID) | INTRAVENOUS | Status: AC
  Administered 2018-05-12 – 2018-05-13 (×3): 1 g via INTRAVENOUS
  Filled 2018-05-12 (×3): qty 50

## 2018-05-12 MED ORDER — DEXAMETHASONE SODIUM PHOSPHATE 10 MG/ML IJ SOLN
INTRAMUSCULAR | Status: AC
  Filled 2018-05-12: qty 1

## 2018-05-12 MED ORDER — ZOLPIDEM TARTRATE 5 MG PO TABS: 5.0000 mg | ORAL_TABLET | Freq: Every evening | ORAL | Status: DC | PRN

## 2018-05-12 MED ORDER — CHLORHEXIDINE GLUCONATE 4 % EX LIQD: 60.0000 mL | Freq: Once | CUTANEOUS | Status: DC

## 2018-05-12 MED ORDER — MEPERIDINE HCL 50 MG/ML IJ SOLN: 6.2500 mg | INTRAMUSCULAR | Status: DC | PRN

## 2018-05-12 MED ORDER — ONDANSETRON HCL 4 MG PO TABS: 4.0000 mg | ORAL_TABLET | Freq: Four times a day (QID) | ORAL | 0 refills | Status: DC | PRN

## 2018-05-12 MED ORDER — CEFAZOLIN SODIUM-DEXTROSE 2-4 GM/100ML-% IV SOLN
INTRAVENOUS | Status: AC
  Filled 2018-05-12: qty 100

## 2018-05-12 MED ORDER — CALCIUM CARBONATE-VITAMIN D 500-200 MG-UNIT PO TABS
2.0000 | ORAL_TABLET | Freq: Every day | ORAL | Status: DC
  Administered 2018-05-12: 2 via ORAL
  Filled 2018-05-12 (×2): qty 2

## 2018-05-12 MED ORDER — ENOXAPARIN (LOVENOX) PATIENT EDUCATION KIT: 1.0000 | PACK | Freq: Once | 0 refills | Status: AC

## 2018-05-12 MED ORDER — ABIRATERONE ACETATE 500 MG PO TABS
1000.0000 mg | ORAL_TABLET | Freq: Every day | ORAL | Status: DC
  Administered 2018-05-13: 1000 mg via ORAL
  Filled 2018-05-12 (×2): qty 2

## 2018-05-12 MED ORDER — EPHEDRINE SULFATE-NACL 50-0.9 MG/10ML-% IV SOSY
PREFILLED_SYRINGE | INTRAVENOUS | Status: DC | PRN
  Administered 2018-05-12 (×2): 10 mg via INTRAVENOUS

## 2018-05-12 MED ORDER — LACTATED RINGERS IV SOLN: INTRAVENOUS | Status: DC

## 2018-05-12 MED ORDER — METHOCARBAMOL 500 MG PO TABS: 500.0000 mg | ORAL_TABLET | Freq: Four times a day (QID) | ORAL | Status: DC | PRN

## 2018-05-12 MED ORDER — PREDNISONE 5 MG PO TABS
5.0000 mg | ORAL_TABLET | Freq: Every day | ORAL | Status: DC
  Administered 2018-05-12 – 2018-05-13 (×2): 5 mg via ORAL
  Filled 2018-05-12 (×2): qty 1

## 2018-05-12 MED ORDER — ALUM & MAG HYDROXIDE-SIMETH 200-200-20 MG/5ML PO SUSP: 30.0000 mL | ORAL | Status: DC | PRN

## 2018-05-12 MED ORDER — PROPOFOL 10 MG/ML IV BOLUS
INTRAVENOUS | Status: AC
  Filled 2018-05-12: qty 40

## 2018-05-12 MED ORDER — SUGAMMADEX SODIUM 200 MG/2ML IV SOLN
INTRAVENOUS | Status: DC | PRN
  Administered 2018-05-12: 156 mg via INTRAVENOUS

## 2018-05-12 MED ORDER — ONDANSETRON HCL 4 MG/2ML IJ SOLN
INTRAMUSCULAR | Status: DC | PRN
  Administered 2018-05-12: 4 mg via INTRAVENOUS

## 2018-05-12 MED ORDER — ENOXAPARIN SODIUM 40 MG/0.4ML ~~LOC~~ SOLN: 40.0000 mg | SUBCUTANEOUS | 0 refills | Status: DC

## 2018-05-12 MED ORDER — ACETAMINOPHEN 325 MG PO TABS: 325.0000 mg | ORAL_TABLET | Freq: Four times a day (QID) | ORAL | Status: DC | PRN

## 2018-05-12 MED ORDER — DOCUSATE SODIUM 100 MG PO CAPS
100.0000 mg | ORAL_CAPSULE | Freq: Two times a day (BID) | ORAL | Status: DC
  Administered 2018-05-12: 100 mg via ORAL
  Filled 2018-05-12: qty 1

## 2018-05-12 MED ORDER — ADULT MULTIVITAMIN W/MINERALS CH: 1.0000 | ORAL_TABLET | Freq: Every day | ORAL | Status: DC

## 2018-05-12 MED ORDER — CEFAZOLIN SODIUM-DEXTROSE 2-4 GM/100ML-% IV SOLN
2.0000 g | INTRAVENOUS | Status: AC
  Administered 2018-05-12: 2 g via INTRAVENOUS

## 2018-05-12 MED ORDER — METHOCARBAMOL 1000 MG/10ML IJ SOLN
500.0000 mg | Freq: Four times a day (QID) | INTRAVENOUS | Status: DC | PRN
  Filled 2018-05-12: qty 5

## 2018-05-12 MED ORDER — FENTANYL CITRATE (PF) 100 MCG/2ML IJ SOLN
INTRAMUSCULAR | Status: AC
  Filled 2018-05-12: qty 2

## 2018-05-12 MED ORDER — ABIRATERONE ACETATE 500 MG PO TABS: 1000.0000 mg | ORAL_TABLET | Freq: Every day | ORAL | Status: DC

## 2018-05-12 MED ORDER — DEXAMETHASONE SODIUM PHOSPHATE 10 MG/ML IJ SOLN
INTRAMUSCULAR | Status: DC | PRN
  Administered 2018-05-12: 10 mg via INTRAVENOUS

## 2018-05-12 MED ORDER — 0.9 % SODIUM CHLORIDE (POUR BTL) OPTIME
TOPICAL | Status: DC | PRN
  Administered 2018-05-12: 1000 mL

## 2018-05-12 MED ORDER — ACETAMINOPHEN 500 MG PO TABS
1000.0000 mg | ORAL_TABLET | Freq: Once | ORAL | Status: AC
  Administered 2018-05-12: 1000 mg via ORAL

## 2018-05-12 MED ORDER — GLYCOPYRROLATE PF 0.2 MG/ML IJ SOSY
PREFILLED_SYRINGE | INTRAMUSCULAR | Status: DC | PRN
  Administered 2018-05-12: .2 mg via INTRAVENOUS

## 2018-05-12 MED ORDER — MIDAZOLAM HCL 2 MG/2ML IJ SOLN
INTRAMUSCULAR | Status: AC
  Filled 2018-05-12: qty 2

## 2018-05-12 MED ORDER — OXYCODONE-ACETAMINOPHEN 5-325 MG PO TABS
1.0000 | ORAL_TABLET | Freq: Four times a day (QID) | ORAL | Status: DC | PRN
  Administered 2018-05-13: 1 via ORAL
  Filled 2018-05-12: qty 1

## 2018-05-12 MED ORDER — EPHEDRINE 5 MG/ML INJ
INTRAVENOUS | Status: AC
  Filled 2018-05-12: qty 20

## 2018-05-12 MED ORDER — DOCUSATE SODIUM 100 MG PO CAPS: 100.0000 mg | ORAL_CAPSULE | Freq: Two times a day (BID) | ORAL | 0 refills | Status: DC

## 2018-05-12 MED ORDER — ACETAMINOPHEN 500 MG PO TABS
1000.0000 mg | ORAL_TABLET | Freq: Four times a day (QID) | ORAL | Status: AC
  Administered 2018-05-12 – 2018-05-13 (×4): 1000 mg via ORAL
  Filled 2018-05-12 (×4): qty 2

## 2018-05-12 MED ORDER — FENTANYL CITRATE (PF) 250 MCG/5ML IJ SOLN
INTRAMUSCULAR | Status: DC | PRN
  Administered 2018-05-12 (×3): 50 ug via INTRAVENOUS

## 2018-05-12 MED ORDER — METHOCARBAMOL 500 MG PO TABS: 500.0000 mg | ORAL_TABLET | Freq: Four times a day (QID) | ORAL | 0 refills | Status: DC | PRN

## 2018-05-12 MED ORDER — OXYCODONE HCL 5 MG PO TABS: 5.0000 mg | ORAL_TABLET | ORAL | Status: DC | PRN

## 2018-05-12 MED ORDER — PHENOL 1.4 % MT LIQD: 1.0000 | OROMUCOSAL | Status: DC | PRN

## 2018-05-12 MED ORDER — OXYCODONE HCL 5 MG PO TABS
5.0000 mg | ORAL_TABLET | ORAL | Status: DC | PRN
  Administered 2018-05-13: 10 mg via ORAL
  Filled 2018-05-12: qty 2

## 2018-05-12 MED ORDER — OXYCODONE HCL 5 MG PO TABS: 5.0000 mg | ORAL_TABLET | Freq: Three times a day (TID) | ORAL | 0 refills | Status: DC | PRN

## 2018-05-12 MED ORDER — FENTANYL CITRATE (PF) 250 MCG/5ML IJ SOLN
INTRAMUSCULAR | Status: AC
  Filled 2018-05-12: qty 5

## 2018-05-12 MED ORDER — MENTHOL 3 MG MT LOZG: 1.0000 | LOZENGE | OROMUCOSAL | Status: DC | PRN

## 2018-05-12 MED ORDER — OXYCODONE HCL 5 MG PO TABS
10.0000 mg | ORAL_TABLET | ORAL | Status: DC | PRN
  Administered 2018-05-12: 15 mg via ORAL
  Filled 2018-05-12: qty 3

## 2018-05-12 MED ORDER — ROCURONIUM BROMIDE 50 MG/5ML IV SOSY
PREFILLED_SYRINGE | INTRAVENOUS | Status: DC | PRN
  Administered 2018-05-12: 50 mg via INTRAVENOUS

## 2018-05-12 MED ORDER — TAMSULOSIN HCL 0.4 MG PO CAPS
0.4000 mg | ORAL_CAPSULE | Freq: Every day | ORAL | Status: DC
  Administered 2018-05-12: 0.4 mg via ORAL
  Filled 2018-05-12: qty 1

## 2018-05-12 MED ORDER — MIDAZOLAM HCL 5 MG/5ML IJ SOLN
INTRAMUSCULAR | Status: DC | PRN
  Administered 2018-05-12 (×2): 1 mg via INTRAVENOUS

## 2018-05-12 MED ORDER — ENOXAPARIN SODIUM 40 MG/0.4ML ~~LOC~~ SOLN: 40.0000 mg | SUBCUTANEOUS | Status: DC

## 2018-05-12 MED ORDER — ONDANSETRON HCL 4 MG/2ML IJ SOLN
INTRAMUSCULAR | Status: AC
  Filled 2018-05-12: qty 2

## 2018-05-12 MED ORDER — ENOXAPARIN (LOVENOX) PATIENT EDUCATION KIT
PACK | Freq: Once | Status: DC
  Filled 2018-05-12: qty 1

## 2018-05-12 MED ORDER — ONDANSETRON HCL 4 MG/2ML IJ SOLN: 4.0000 mg | Freq: Four times a day (QID) | INTRAMUSCULAR | Status: DC | PRN

## 2018-05-12 MED ORDER — METOCLOPRAMIDE HCL 5 MG PO TABS: 5.0000 mg | ORAL_TABLET | Freq: Three times a day (TID) | ORAL | Status: DC | PRN

## 2018-05-12 MED ORDER — ONDANSETRON HCL 4 MG PO TABS: 4.0000 mg | ORAL_TABLET | Freq: Four times a day (QID) | ORAL | Status: DC | PRN

## 2018-05-12 MED ORDER — ENOXAPARIN SODIUM 40 MG/0.4ML ~~LOC~~ SOLN
40.0000 mg | SUBCUTANEOUS | Status: DC
  Administered 2018-05-13: 40 mg via SUBCUTANEOUS
  Filled 2018-05-12: qty 0.4

## 2018-05-12 MED ORDER — POTASSIUM CHLORIDE IN NACL 20-0.9 MEQ/L-% IV SOLN
INTRAVENOUS | Status: DC
  Administered 2018-05-12: 15:00:00 via INTRAVENOUS
  Filled 2018-05-12: qty 1000

## 2018-05-12 MED ORDER — LIDOCAINE 2% (20 MG/ML) 5 ML SYRINGE
INTRAMUSCULAR | Status: DC | PRN
  Administered 2018-05-12: 100 mg via INTRAVENOUS

## 2018-05-12 MED ORDER — ROCURONIUM BROMIDE 50 MG/5ML IV SOSY
PREFILLED_SYRINGE | INTRAVENOUS | Status: AC
  Filled 2018-05-12: qty 5

## 2018-05-12 MED ORDER — HYDROMORPHONE HCL 1 MG/ML IJ SOLN: 0.5000 mg | INTRAMUSCULAR | Status: DC | PRN

## 2018-05-12 MED ORDER — SODIUM CHLORIDE 0.9 % IV SOLN
INTRAVENOUS | Status: DC | PRN
  Administered 2018-05-12: 11:00:00 via INTRAVENOUS
  Administered 2018-05-12: 20 ug/min via INTRAVENOUS

## 2018-05-12 MED ORDER — ACETAMINOPHEN 500 MG PO TABS
ORAL_TABLET | ORAL | Status: AC
  Administered 2018-05-12: 1000 mg via ORAL
  Filled 2018-05-12: qty 2

## 2018-05-12 MED ORDER — PHENYLEPHRINE 40 MCG/ML (10ML) SYRINGE FOR IV PUSH (FOR BLOOD PRESSURE SUPPORT)
PREFILLED_SYRINGE | INTRAVENOUS | Status: AC
  Filled 2018-05-12: qty 10

## 2018-05-12 MED ORDER — LIDOCAINE 2% (20 MG/ML) 5 ML SYRINGE
INTRAMUSCULAR | Status: AC
  Filled 2018-05-12: qty 5

## 2018-05-12 MED ORDER — PROPOFOL 10 MG/ML IV BOLUS
INTRAVENOUS | Status: DC | PRN
  Administered 2018-05-12: 140 mg via INTRAVENOUS

## 2018-05-12 SURGICAL SUPPLY — 82 items
BENZOIN TINCTURE PRP APPL 2/3 (GAUZE/BANDAGES/DRESSINGS) ×6 IMPLANT
BIT DRILL 3.2 (BIT) ×2
BIT DRILL 3.2XCALB NS DISP (BIT) ×1 IMPLANT
BIT DRILL CALIBRATED 2.7 (BIT) ×2 IMPLANT
BIT DRILL CALIBRATED 2.7MM (BIT) ×1
BIT DRL 3.2XCALB NS DISP (BIT) ×1
BNDG GAUZE ELAST 4 BULKY (GAUZE/BANDAGES/DRESSINGS) ×6 IMPLANT
BRUSH SCRUB SURG 4.25 DISP (MISCELLANEOUS) ×6 IMPLANT
COVER SURGICAL LIGHT HANDLE (MISCELLANEOUS) ×6 IMPLANT
COVER WAND RF STERILE (DRAPES) ×3 IMPLANT
DRAPE C-ARM 42X72 X-RAY (DRAPES) ×3 IMPLANT
DRAPE C-ARMOR (DRAPES) ×3 IMPLANT
DRAPE IMP U-DRAPE 54X76 (DRAPES) ×3 IMPLANT
DRAPE INCISE IOBAN 66X45 STRL (DRAPES) IMPLANT
DRAPE ORTHO SPLIT 77X108 STRL (DRAPES) ×4
DRAPE SURG 17X11 SM STRL (DRAPES) ×6 IMPLANT
DRAPE SURG ORHT 6 SPLT 77X108 (DRAPES) ×2 IMPLANT
DRAPE U-SHAPE 47X51 STRL (DRAPES) ×6 IMPLANT
DRSG ADAPTIC 3X8 NADH LF (GAUZE/BANDAGES/DRESSINGS) ×3 IMPLANT
DRSG MEPILEX BORDER 4X12 (GAUZE/BANDAGES/DRESSINGS) ×3 IMPLANT
DRSG PAD ABDOMINAL 8X10 ST (GAUZE/BANDAGES/DRESSINGS) ×3 IMPLANT
ELECT REM PT RETURN 9FT ADLT (ELECTROSURGICAL) ×3
ELECTRODE REM PT RTRN 9FT ADLT (ELECTROSURGICAL) ×1 IMPLANT
EVACUATOR 1/8 PVC DRAIN (DRAIN) IMPLANT
GAUZE SPONGE 4X4 12PLY STRL (GAUZE/BANDAGES/DRESSINGS) ×6 IMPLANT
GLOVE BIO SURGEON STRL SZ7.5 (GLOVE) ×3 IMPLANT
GLOVE BIO SURGEON STRL SZ8 (GLOVE) ×3 IMPLANT
GLOVE BIOGEL PI IND STRL 7.5 (GLOVE) ×1 IMPLANT
GLOVE BIOGEL PI IND STRL 8 (GLOVE) ×1 IMPLANT
GLOVE BIOGEL PI INDICATOR 7.5 (GLOVE) ×2
GLOVE BIOGEL PI INDICATOR 8 (GLOVE) ×2
GOWN STRL REUS W/ TWL LRG LVL3 (GOWN DISPOSABLE) ×2 IMPLANT
GOWN STRL REUS W/ TWL XL LVL3 (GOWN DISPOSABLE) ×1 IMPLANT
GOWN STRL REUS W/TWL 2XL LVL3 (GOWN DISPOSABLE) ×3 IMPLANT
GOWN STRL REUS W/TWL LRG LVL3 (GOWN DISPOSABLE) ×4
GOWN STRL REUS W/TWL XL LVL3 (GOWN DISPOSABLE) ×2
K-WIRE 2X5 SS THRDED S3 (WIRE) ×18
K-WIRE ACE 1.6X6 (WIRE) ×3
KIT BASIN OR (CUSTOM PROCEDURE TRAY) ×3 IMPLANT
KIT TURNOVER KIT B (KITS) ×3 IMPLANT
KWIRE 2X5 SS THRDED S3 (WIRE) ×6 IMPLANT
KWIRE ACE 1.6X6 (WIRE) ×1 IMPLANT
LOOP VESSEL MAXI BLUE (MISCELLANEOUS) IMPLANT
MANIFOLD NEPTUNE II (INSTRUMENTS) ×3 IMPLANT
NS IRRIG 1000ML POUR BTL (IV SOLUTION) ×3 IMPLANT
PACK TOTAL JOINT (CUSTOM PROCEDURE TRAY) ×3 IMPLANT
PACK UNIVERSAL I (CUSTOM PROCEDURE TRAY) ×3 IMPLANT
PAD ARMBOARD 7.5X6 YLW CONV (MISCELLANEOUS) ×6 IMPLANT
PEG LOCKING 3.2MMX56 (Peg) ×3 IMPLANT
PEG LOCKING 3.2X 60MM (Peg) ×6 IMPLANT
PEG LOCKING 3.2X36 (Screw) ×3 IMPLANT
PEG LOCKING 3.2X42 (Screw) ×3 IMPLANT
PLATE HUMERUS PROXIMAL 11H LT (Plate) ×3 IMPLANT
SCREW CORTICAL LOW PROF 3.5X32 (Screw) ×3 IMPLANT
SCREW LOCK CORT STAR 3.5X10 (Screw) ×3 IMPLANT
SCREW LOCK CORT STAR 3.5X30 (Screw) ×3 IMPLANT
SCREW LOCK CORT STAR 3.5X32 (Screw) ×3 IMPLANT
SCREW LOW PROF TIS 3.5X28MM (Screw) ×6 IMPLANT
SCREW LOW PROFILE 3.5X30MM TIS (Screw) ×3 IMPLANT
SCREW LP NL T15 3.5X26 (Screw) ×3 IMPLANT
SCREW T15 MD 3.5X46MM NS (Screw) ×3 IMPLANT
SCREW T15 MD 3.5X54MM NS (Screw) ×3 IMPLANT
SPONGE LAP 18X18 RF (DISPOSABLE) IMPLANT
STAPLER VISISTAT 35W (STAPLE) ×3 IMPLANT
STOCKINETTE IMPERVIOUS LG (DRAPES) ×3 IMPLANT
SUCTION FRAZIER HANDLE 10FR (MISCELLANEOUS) ×2
SUCTION TUBE FRAZIER 10FR DISP (MISCELLANEOUS) ×1 IMPLANT
SUT ETHIBOND 5 LR DA (SUTURE) ×3 IMPLANT
SUT FIBERWIRE #2 38 T-5 BLUE (SUTURE)
SUT PDS AB 2-0 CT1 27 (SUTURE) IMPLANT
SUT VIC AB 0 CT1 27 (SUTURE) ×4
SUT VIC AB 0 CT1 27XBRD ANBCTR (SUTURE) ×2 IMPLANT
SUT VIC AB 2-0 CT1 27 (SUTURE) ×4
SUT VIC AB 2-0 CT1 TAPERPNT 27 (SUTURE) ×2 IMPLANT
SUT VIC AB 2-0 CT3 27 (SUTURE) IMPLANT
SUTURE FIBERWR #2 38 T-5 BLUE (SUTURE) IMPLANT
SYR 5ML LL (SYRINGE) IMPLANT
TOWEL OR 17X24 6PK STRL BLUE (TOWEL DISPOSABLE) ×3 IMPLANT
TOWEL OR 17X26 10 PK STRL BLUE (TOWEL DISPOSABLE) ×6 IMPLANT
TRAY FOLEY MTR SLVR 16FR STAT (SET/KITS/TRAYS/PACK) IMPLANT
WATER STERILE IRR 1000ML POUR (IV SOLUTION) ×3 IMPLANT
YANKAUER SUCT BULB TIP NO VENT (SUCTIONS) IMPLANT

## 2018-05-12 NOTE — Transfer of Care (Signed)
Immediate Anesthesia Transfer of Care Note  Patient: Ethan [name]  Procedure(s) Performed: OPEN REDUCTION INTERNAL FIXATION (ORIF) LEFT PROXIMAL HUMERUS FRACTURE (Left Arm Upper)  Patient Location: PACU  Anesthesia Type:GA combined with regional for post-op pain  Level of Consciousness: awake, drowsy and patient cooperative  Airway & Oxygen Therapy: Patient Spontanous Breathing and Patient connected to nasal cannula oxygen  Post-op Assessment: Report given to RN and Post -op Vital signs reviewed and stable  Post vital signs: Reviewed and stable  Last Vitals:  Vitals Value Taken Time  BP 90/51 05/12/2018 12:03 PM  Temp    Pulse 80 05/12/2018 12:04 PM  Resp 14 05/12/2018 12:04 PM  SpO2 100 % 05/12/2018 12:04 PM  Vitals shown include unvalidated device data.  Last Pain:  Vitals:   05/12/18 0805  TempSrc:   PainSc: 0-No pain         Complications: No apparent anesthesia complications

## 2018-05-12 NOTE — Op Note (Signed)
05/12/2018  11:28 AM  PATIENT:  Ethan [name]  68 y.o. male  PRE-OPERATIVE DIAGNOSIS:  LEFT HUMERUS FRACTURE, SURGICAL NECK AND SHAFT  POST-OPERATIVE DIAGNOSIS:  LEFT HUMERUS FRACTURE, SURGICAL NECK AND SHAFT  PROCEDURE:  Procedure(s): 1. OPEN REDUCTION INTERNAL FIXATION (ORIF) LEFT PROXIMAL HUMERUS SURGICAL NECK FRACTURE (Left) 2. OPEN REDUCTION INTERNAL FIXATION (ORIF) LEFT HUMERAL SHAFT FRACTURE (Left)  SURGEON:  Surgeon(s) and Role:    Altamese Quantico, MD - Primary  PHYSICIAN ASSISTANT: 1. KEITH PAUL, PA-C; 2. PA Student  ANESTHESIA:   general and regional block  EBL:  300 mL   BLOOD ADMINISTERED:none  DRAINS: none   LOCAL MEDICATIONS USED:  NONE  SPECIMEN:  Source of Specimen:  cancellous bone and fracture hematoma  DISPOSITION OF SPECIMEN:  PATHOLOGY  COUNTS:  YES  TOURNIQUET:  * No tourniquets in log *  DICTATION: .Note written in EPIC  PLAN OF CARE: Admit to inpatient   PATIENT DISPOSITION:  PACU - hemodynamically stable.   Delay start of Pharmacological VTE agent (>24hrs) due to surgical blood loss or risk of bleeding: no  BRIEF SUMMARY OF INDICATION FOR PROCEDURE:  Ethan [name] is a very pleasant 68 y.o. right-hand dominant with history of metastatic prostate cancer who sustained a left humerus fracture involving both the surgical neck and shaft. In order to most reliably restore function and reduce pain I recommended surgical repair.  I discussed risks of heart attack, stroke, infection, malunion, nonunion, loss of motion, DVT/ PE, loss of reduction, avascular necrosis, screw penetration, and need for further surgery, among others with the patient and his wife. Consent was provided to proceed.  BRIEF SUMMARY OF PROCEDURE:  After placement of regional block and preoperative antibiotics, the patient was taken to the operating room where general anesthesia was induced. The standard deltopectoral approach was made after time-out.  Dissection was carried down  to the interval where the superior lateral edge of the pec insertion was mobilized as well as the more medial anterior edge of the deltoid.  I was able to mobilize the head segment and identify the proximal fracture site. Hematoma was evacuated with curettes and lavage. Using the long head of the biceps and the bicipital groove as landmarks, reduction maneuvers were performed while keeping the periostial attachments to all the fragments.  I then turned my attention distally to the shaft fracture site. I evacuated the hematoma here and with the help of my assistant dialed in the reduction and secured it provisionally with a tenaculum. Pins were used to supplement fixation. I then placed the plate and was able to use one long Biomet plate to span both fractures. Additional pins for further fixation were placed into the plate.  C-arm confirmed appropriate alignment and reduction with goals for restoration of proper head shaft orientation and tuberosity position. I did fine tune the surgical neck reduction. This was followed by a screw fixation into the shaft in the slotted hole and then peg fixation into the head. The reduction of the humeral head and shaft was excellent and so we proceeded with additional fixation. Locked pegs were placed into the head and primarily standard screws into the shaft with two locked one of which was unicortical in the interval segment.  Final images showed appropriate reduction, hardware trajectory, and length. Outstanding clinical motion was feasible on the table, including abduction, and internal and external rotation. My assistant, Ethan [name], was present and assisting throughout.  An assistant was absolutely necessary for safe and effective completion as the  reduction had to be held during provisional and definitive fixation.  Lastly, cancellous bone and hematoma in the middle of the fracture site was sent to pathology for cytology. We did not identify any clinical evidence of  cancer within the soft tissues or cortical bone. There was little cancellous bone to send to the lab. The wound was thoroughly irrigated and then closed in a standard layered fashion using #1 Vicryl to reapproximate the superior edge of the pec and anterior edge of the delt and then 0 for reapproximation of the muscular interval, 2-0 Vicryl and nylon for the skin.  Sterile gently compressive dressing was applied and a sling with an Ace from hand to upper arm.  The patient was taken to PACU in stable condition.  PROGNOSIS:  Ethan [name] will have unrestricted gentle passive range of motion of the operative shoulder at this time with active elbow, wrist, and digital motion.  We will likely resume weightbearing at 6 weeks.  May be a candidate for earlier weightbearing, but if can mobilize effectively without that, then we will defer.  I concerned about the fall risk.  Bone quality was reasonable, which is also significant with regard to future fall risk.     Ethan [name], M.D.

## 2018-05-12 NOTE — Discharge Instructions (Signed)
Orthopaedic Trauma Service Discharge Instructions   General Discharge Instructions  WEIGHT BEARING STATUS: Nonweightbearing Left arm, no lifting with left arm   RANGE OF MOTION/ACTIVITY: gentle pendulums Left shoulder. Unrestricted range of motion left elbow, forearm, wrist and hand.  No active shoulder abduction (chicken wing positition).  Continue exercises taught to you by therapy   Wound Care: daily wound care starting on 05/14/2018. See below   Discharge Wound Care Instructions  Do NOT apply any ointments, solutions or lotions to pin sites or surgical wounds.  These prevent needed drainage and even though solutions like hydrogen peroxide kill bacteria, they also damage cells lining the pin sites that help fight infection.  Applying lotions or ointments can keep the wounds moist and can cause them to breakdown and open up as well. This can increase the risk for infection. When in doubt call the office.  Surgical incisions should be dressed daily.  If any drainage is noted, use one layer of adaptic, then gauze, Kerlix, and an ace wrap.  Once the incision is completely dry and without drainage, it may be left open to air out.  Showering may begin 36-48 hours later.  Cleaning gently with soap and water.  Traumatic wounds should be dressed daily as well.    One layer of adaptic, gauze, Kerlix, then ace wrap.  The adaptic can be discontinued once the draining has ceased    If you have a wet to dry dressing: wet the gauze with saline the squeeze as much saline out so the gauze is moist (not soaking wet), place moistened gauze over wound, then place a dry gauze over the moist one, followed by Kerlix wrap, then ace wrap.   DVT/PE prophylaxis: Lovenox 40 mg subcutaneous injection daily x 2 weeks   Diet: as you were eating previously.  Can use over the counter stool softeners and bowel preparations, such as Miralax, to help with bowel movements.  Narcotics can be constipating.  Be sure to  drink plenty of fluids  PAIN MEDICATION USE AND EXPECTATIONS  You have likely been given narcotic medications to help control your pain.  After a traumatic event that results in an fracture (broken bone) with or without surgery, it is ok to use narcotic pain medications to help control one's pain.  We understand that everyone responds to pain differently and each individual patient will be evaluated on a regular basis for the continued need for narcotic medications. Ideally, narcotic medication use should last no more than 6-8 weeks (coinciding with fracture healing).   As a patient it is your responsibility as well to monitor narcotic medication use and report the amount and frequency you use these medications when you come to your office visit.   We would also advise that if you are using narcotic medications, you should take a dose prior to therapy to maximize you participation.  IF YOU ARE ON NARCOTIC MEDICATIONS IT IS NOT PERMISSIBLE TO OPERATE A MOTOR VEHICLE (MOTORCYCLE/CAR/TRUCK/MOPED) OR HEAVY MACHINERY DO NOT MIX NARCOTICS WITH OTHER CNS (CENTRAL NERVOUS SYSTEM) DEPRESSANTS SUCH AS ALCOHOL   STOP SMOKING OR USING NICOTINE PRODUCTS!!!!  As discussed nicotine severely impairs your body's ability to heal surgical and traumatic wounds but also impairs bone healing.  Wounds and bone heal by forming microscopic blood vessels (angiogenesis) and nicotine is a vasoconstrictor (essentially, shrinks blood vessels).  Therefore, if vasoconstriction occurs to these microscopic blood vessels they essentially disappear and are unable to deliver necessary nutrients to the healing tissue.  This is one modifiable factor that you can do to dramatically increase your chances of healing your injury.    (This means no smoking, no nicotine gum, patches, etc)  DO NOT USE NONSTEROIDAL ANTI-INFLAMMATORY DRUGS (NSAID'S)  Using products such as Advil (ibuprofen), Aleve (naproxen), Motrin (ibuprofen) for additional pain  control during fracture healing can delay and/or prevent the healing response.  If you would like to take over the counter (OTC) medication, Tylenol (acetaminophen) is ok.  However, some narcotic medications that are given for pain control contain acetaminophen as well. Therefore, you should not exceed more than 4000 mg of tylenol in a day if you do not have liver disease.  Also note that there are may OTC medicines, such as cold medicines and allergy medicines that my contain tylenol as well.  If you have any questions about medications and/or interactions please ask your doctor/PA or your pharmacist.      ICE AND ELEVATE INJURED/OPERATIVE EXTREMITY  Using ice and elevating the injured extremity above your heart can help with swelling and pain control.  Icing in a pulsatile fashion, such as 20 minutes on and 20 minutes off, can be followed.    Do not place ice directly on skin. Make sure there is a barrier between to skin and the ice pack.    Using frozen items such as frozen peas works well as the conform nicely to the are that needs to be iced.  USE AN ACE WRAP OR TED HOSE FOR SWELLING CONTROL  In addition to icing and elevation, Ace wraps or TED hose are used to help limit and resolve swelling.  It is recommended to use Ace wraps or TED hose until you are informed to stop.    When using Ace Wraps start the wrapping distally (farthest away from the body) and wrap proximally (closer to the body)   Example: If you had surgery on your leg or thing and you do not have a splint on, start the ace wrap at the toes and work your way up to the thigh        If you had surgery on your upper extremity and do not have a splint on, start the ace wrap at your fingers and work your way up to the upper arm   [place]: [phone]

## 2018-05-12 NOTE — Anesthesia Procedure Notes (Addendum)
Procedure Name: Intubation Date/Time: 05/12/2018 8:27 AM Performed by: Renato Shin, CRNA Pre-anesthesia Checklist: Patient identified, Emergency Drugs available, Suction available and Patient being monitored Patient Re-evaluated:Patient Re-evaluated prior to induction Oxygen Delivery Method: Circle system utilized Preoxygenation: Pre-oxygenation with 100% oxygen Induction Type: IV induction Ventilation: Mask ventilation without difficulty Laryngoscope Size: Mac and 4 Grade View: Grade I Tube type: Oral Tube size: 7.5 mm Number of attempts: 1 Airway Equipment and Method: Stylet Placement Confirmation: ETT inserted through vocal cords under direct vision,  positive ETCO2 and breath sounds checked- equal and bilateral Secured at: 21 cm Tube secured with: Tape Dental Injury: Teeth and Oropharynx as per pre-operative assessment  Comments: Intubation by Elyse Jarvis, paramedic student

## 2018-05-12 NOTE — Anesthesia Postprocedure Evaluation (Signed)
Anesthesia Post Note  Patient: Ethan [name]  Procedure(s) Performed: OPEN REDUCTION INTERNAL FIXATION (ORIF) LEFT PROXIMAL HUMERUS FRACTURE (Left Arm Upper)     Patient location during evaluation: PACU Anesthesia Type: General Level of consciousness: sedated and patient cooperative Pain management: pain level controlled Vital Signs Assessment: post-procedure vital signs reviewed and stable Respiratory status: spontaneous breathing Cardiovascular status: stable Anesthetic complications: no    Last Vitals:  Vitals:   05/12/18 1232 05/12/18 1248  BP: 99/69 103/62  Pulse: 80 81  Resp: 14 (!) 24  Temp:  (!) 36.4 C  SpO2: 100% 96%    Last Pain:  Vitals:   05/12/18 1232  TempSrc:   PainSc: 0-No pain                 Nolon Nations

## 2018-05-13 DIAGNOSIS — C7951 Secondary malignant neoplasm of bone: Secondary | ICD-10-CM | POA: Diagnosis not present

## 2018-05-13 DIAGNOSIS — C61 Malignant neoplasm of prostate: Secondary | ICD-10-CM | POA: Diagnosis not present

## 2018-05-13 DIAGNOSIS — R296 Repeated falls: Secondary | ICD-10-CM | POA: Diagnosis not present

## 2018-05-13 DIAGNOSIS — S42352A Displaced comminuted fracture of shaft of humerus, left arm, initial encounter for closed fracture: Secondary | ICD-10-CM | POA: Diagnosis not present

## 2018-05-13 DIAGNOSIS — I10 Essential (primary) hypertension: Secondary | ICD-10-CM | POA: Diagnosis not present

## 2018-05-13 DIAGNOSIS — S42212A Unspecified displaced fracture of surgical neck of left humerus, initial encounter for closed fracture: Secondary | ICD-10-CM | POA: Diagnosis not present

## 2018-05-13 LAB — CBC
HCT: 32.2 % — ABNORMAL LOW (ref 39.0–52.0)
Hemoglobin: 10.9 g/dL — ABNORMAL LOW (ref 13.0–17.0)
MCH: 31.9 pg (ref 26.0–34.0)
MCHC: 33.9 g/dL (ref 30.0–36.0)
MCV: 94.2 fL (ref 80.0–100.0)
PLATELETS: 254 10*3/uL (ref 150–400)
RBC: 3.42 MIL/uL — ABNORMAL LOW (ref 4.22–5.81)
RDW: 12.4 % (ref 11.5–15.5)
WBC: 10 10*3/uL (ref 4.0–10.5)
nRBC: 0 % (ref 0.0–0.2)

## 2018-05-13 LAB — VITAMIN D 25 HYDROXY (VIT D DEFICIENCY, FRACTURES): Vit D, 25-Hydroxy: 50.1 ng/mL (ref 30.0–100.0)

## 2018-05-13 NOTE — Evaluation (Signed)
Occupational Therapy Evaluation Patient Details Name: Ethan [name] MRN: [id number] DOB: 02-05-1951 Today's Date: 05/13/2018    History of Present Illness Pt is a 68 yo male s/p fall resulting L humeral fx and pt d/c in a sling at ER visit and returned x1 wk later for LUE ORIF with NWB status in sling to reduce pain and to be able to mobilize. 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   Clinical Impression   Pt PTA: in pain in sling x1 week prior to this sx. Pt living with spouse in 1 level home and was independent prior to fall and ambulating 4-5 miles daily. Pt currently limited by pain. Shoulder precautions ( handout provided): Educated patient on don doff brace with return demonstration,washing under arms with cloth, never to wash directly on incision site, avoid shoulder movement. positioning with pillows in chair for , sleeping positioning (recommend recliner if possible), pt educated on dressing care during bathing/ dressing.  Home exercise program as stated below. Precautions below. OTR answered all questions and pt's spouse in room for directions. Pt dressed, groomed and family assist with instructions for exercise. Pt would benefit from continued OT skilled services in next venture. OT signing off.     Follow Up Recommendations  Follow surgeon's recommendation for DC plan and follow-up therapies;Supervision - Intermittent    Equipment Recommendations  Tub/shower seat;Tub/shower bench    Recommendations for Other Services       Precautions / Restrictions Precautions Precautions: Shoulder Type of Shoulder Precautions: elbow, wrist and hand-ok; pendellums-ok; PROM 0-90 FF; no abduction abd ER 0-30 PROM only. No AROM of shoulder x6 weeks. Shoulder Interventions: Shoulder sling/immobilizer;At all times;Off for dressing/bathing/exercises Precaution Booklet Issued: Yes (comment) Precaution Comments: exercises provided and precautions handout provided Required Braces or Orthoses:  Sling Restrictions Weight Bearing Restrictions: Yes LUE Weight Bearing: Non weight bearing      Mobility Bed Mobility Overal bed mobility: Needs Assistance Bed Mobility: Rolling;Sidelying to Sit Rolling: Min guard Sidelying to sit: Min guard       General bed mobility comments: use of RUE elbow and BLEs for trunk elevation  Transfers Overall transfer level: Needs assistance   Transfers: Sit to/from Stand;Stand Pivot Transfers Sit to Stand: Supervision Stand pivot transfers: Supervision            Balance Overall balance assessment: Mild deficits observed, not formally tested                                         ADL either performed or assessed with clinical judgement   ADL Overall ADL's : Needs assistance/impaired Eating/Feeding: Set up   Grooming: Set up;Standing   Upper Body Bathing: Moderate assistance;Sitting;Standing   Lower Body Bathing: Moderate assistance;Sitting/lateral leans;Sit to/from stand   Upper Body Dressing : Moderate assistance;Sitting;Standing   Lower Body Dressing: Moderate assistance;Sit to/from stand;Sitting/lateral leans   Toilet Transfer: Min guard;Comfort height toilet   Toileting- Clothing Manipulation and Hygiene: Moderate assistance;Sitting/lateral lean;Sit to/from stand       Functional mobility during ADLs: Supervision/safety General ADL Comments: Pt and spouse in room for ADL instruction for bathing and dressing- dressing performed. Spouse is very supportive. Pt grooming at sink in standing. Pt tolerating session well.     Vision Baseline Vision/History: No visual deficits Vision Assessment?: No apparent visual deficits     Perception     Praxis      Pertinent Vitals/Pain Pain Assessment:  0-10 Pain Score: 5  Pain Location: L hand Pain Descriptors / Indicators: Throbbing Pain Intervention(s): Premedicated before session;Repositioned;Monitored during session;Limited activity within patient's  tolerance     Hand Dominance Right   Extremity/Trunk Assessment Upper Extremity Assessment Upper Extremity Assessment: LUE deficits/detail;Generalized weakness LUE Deficits / Details: s/p L humeral ORIF LUE: Unable to fully assess due to pain;Unable to fully assess due to immobilization   Lower Extremity Assessment Lower Extremity Assessment: Generalized weakness   Cervical / Trunk Assessment Cervical / Trunk Assessment: Normal   Communication Communication Communication: No difficulties   Cognition Arousal/Alertness: Awake/alert Behavior During Therapy: WFL for tasks assessed/performed Overall Cognitive Status: Within Functional Limits for tasks assessed                                     General Comments  Shoulder surgery handout provided. Spouse in room to confirm info. pt and spouse a great team with proper technique for donning/doffing sling and dressing techniques after demo. Pt performing exercises provided with pain reported.    Exercises Exercises: Shoulder;Hand exercises Shoulder Exercises Pendulum Exercise: PROM;5 reps;Standing Shoulder Flexion: PROM;5 reps;Seated Shoulder External Rotation: PROM;5 reps;Seated Elbow Flexion: AAROM;10 reps;Seated Elbow Extension: AAROM;Left;10 reps;Seated Wrist Flexion: AAROM;Left;10 reps;Seated Wrist Extension: AAROM;Left;10 reps;Seated Digit Composite Flexion: AAROM;Left;10 reps;Seated Composite Extension: AAROM;Left;10 reps;Seated Neck Flexion: AROM Neck Extension: AROM   Shoulder Instructions Shoulder Instructions Donning/doffing shirt without moving shoulder: Moderate assistance;Caregiver independent with task Method for sponge bathing under operated UE: Moderate assistance;Caregiver independent with task Donning/doffing sling/immobilizer: Caregiver independent with task Correct positioning of sling/immobilizer: Caregiver independent with task Pendulum exercises (written home exercise program): Caregiver  independent with task;Min-guard ROM for elbow, wrist and digits of operated UE: Min-guard;Caregiver independent with task Sling wearing schedule (on at all times/off for ADL's): Caregiver independent with task Proper positioning of operated UE when showering: Caregiver independent with task Positioning of UE while sleeping: Min-guard;Caregiver independent with task    Home Living Family/patient expects to be discharged to:: Private residence Living Arrangements: Spouse/significant other Available Help at Discharge: Family Type of Home: House Home Access: Stairs to enter Technical brewer of Steps: 3   Home Layout: One level     Bathroom Shower/Tub: Occupational psychologist: Handicapped height     [place]: Environmental consultant - 2 wheels          Prior Functioning/Environment Level of Independence: Independent                 OT Problem List: Decreased strength;Decreased activity tolerance;Impaired balance (sitting and/or standing);Decreased safety awareness;Decreased coordination;Pain;Increased edema      OT Treatment/Interventions:      OT Goals(Current goals can be found in the care plan section) Acute Rehab OT Goals Patient Stated Goal: to go home with less pain OT Goal Formulation: With patient  OT Frequency:     Barriers to D/C:            Co-evaluation              AM-PAC OT "6 Clicks" Daily Activity     Outcome Measure Help from another person eating meals?: None Help from another person taking care of personal grooming?: A Little Help from another person toileting, which includes using toliet, bedpan, or urinal?: A Little Help from another person bathing (including washing, rinsing, drying)?: A Little Help from another person to put on and taking off regular upper body clothing?: A  Little Help from another person to put on and taking off regular lower body clothing?: A Little 6 Click Score: 19   End of Session Nurse Communication:  Mobility status  Activity Tolerance: Patient tolerated treatment well Patient left: in chair;with call bell/phone within reach;with family/visitor present  OT Visit Diagnosis: Unsteadiness on feet (R26.81);Pain Pain - Right/Left: Left Pain - part of body: Shoulder                Time: 5747-3403 OT Time Calculation (min): 51 min Charges:  OT General Charges $OT Visit: 1 Visit OT Evaluation $OT Eval Moderate Complexity: 1 Mod OT Treatments $Self Care/Home Management : 8-22 mins $Neuromuscular Re-education: 8-22 mins  Darryl Nestle) Marsa Aris OTR/L Acute Rehabilitation Services Pager: [phone] Office: [phone]   Fredda Hammed 05/13/2018, 8:55 AM

## 2018-05-13 NOTE — Plan of Care (Signed)
Problem: Education: Goal: Knowledge of General Education information will improve Description Including pain rating scale, medication(s)/side effects and non-pharmacologic comfort measures 05/13/2018 0138 by Lendon Colonel, RN Outcome: Progressing 05/13/2018 0137 by Lendon Colonel, RN Outcome: Progressing   Problem: Health Behavior/Discharge Planning: Goal: Ability to manage health-related needs will improve 05/13/2018 0138 by Lendon Colonel, RN Outcome: Progressing   Problem: Clinical Measurements: Goal: Ability to maintain clinical measurements within normal limits will improve 05/13/2018 0138 by Lendon Colonel, RN Outcome: Progressing  Goal: Will remain free from infection 05/13/2018 0208 by Lendon Colonel, RN Outcome: Progressing  Goal: Diagnostic test results will improve 05/13/2018 0208 by Lendon Colonel, RN Outcome: Progressing Goal: Respiratory complications will improve 05/13/2018 0208 by Lendon Colonel, RN Outcome: Progressing  Goal: Cardiovascular complication will be avoided 05/13/2018 0208 by Lendon Colonel, RN Outcome: Progressing    Problem: Activity: Goal: Risk for activity intolerance will decrease 05/13/2018 0208 by Lendon Colonel,    Problem: Nutrition: Goal: Adequate nutrition will be maintained 05/13/2018 0208 by Lendon Colonel, RN Outcome: Progressin   Problem: Coping: Goal: Level of anxiety will decrease 05/13/2018 0208 by Lendon Colonel, RN Outcome: Progressing    Problem: Elimination: Goal: Will not experience complications related to bowel motility 05/13/2018 0138 by Lendon Colonel, RN Outcome: Progressing 05/13/2018 0137 by Lendon Colonel, RN Outcome: Progressing Goal: Will not experience complications related to urinary retention 05/13/2018 0208 by Lendon Colonel, RN Outcome: Progressin   Problem: Safety: Goal: Ability to remain free from injury will improve 05/13/2018 0208 by Lendon Colonel, RN Outcome: Progressing    Problem: Skin  Integrity: Goal: Risk for impaired skin integrity will decrease 05/13/2018 0208 by Lendon Colonel, RN Outcome: Progressin Problem: Education: Goal: Knowledge of General Education information will improve Description Including pain rating scale, medication(s)/side effects and non-pharmacologic comfort measures 05/13/2018 0138 by Lendon Colonel, RN Outcome: Progressing    Problem: Health Behavior/Discharge Planning: Goal: Ability to manage health-related needs will improve 05/13/2018 0138 by Lendon Colonel, RN Outcome: Progressing   Problem: Clinical Measurements: Goal: Ability to maintain clinical measurements within normal limits will improve 05/13/2018 0138 by Lendon Colonel, RN Outcome: Progressin Goal: Will remain free from infection 05/13/2018 0212 by Lendon Colonel, RN Outcome: Progressing  Goal: Diagnostic test results will improve 05/13/2018 0208 by Lendon Colonel, RN Outcome: Progressing Goal: Respiratory complications will improve 05/13/2018 0212 by Lendon Colonel, RN Outcome Progressing Goal: Cardiovascular complication will be avoided 05/13/2018 5027 by Lendon Colonel, RN Outcome Progressing   Problem: Activity: Goal: Risk for activity intolerance will decrease 05/13/2018 0212 by Lendon Colonel, RN Outcome: Progressing    Problem: Nutrition: Goal: Adequate nutrition will be maintained 05/13/2018 0212 by Lendon Colonel, RN Outcome: Progressing     Problem: Coping: Goal: Level of anxiety will decrease 05/13/2018 0208 by Lendon Colonel, RN Outcome: Progressing 05/13/2018 0138 by Lendon Colonel,    Problem: Elimination: Goal: Will not experience complications related to bowel motility 05/13/2018 0212 by Lendon Colonel, RN Outcome: Progressing 05/13/2018 0138 by Lendon Colonel,  Goal: Will not experience complications related to urinary retention 05/13/2018 0212 by Lendon Colonel, RN Outcome: Progressing   Problem: Safety: Goal: Ability to remain free from  injury will improve 05/13/2018 0212 by Lendon Colonel, RN Outcome: Progressi   Problem: Skin Integrity: Goal: Risk for impaired skin integrity will decrease 05/13/2018 0212 by Lendon Colonel, RN Outcome:  Progressing 05/13/2018 0208 by Lendon Colonel, RN

## 2018-05-13 NOTE — Plan of Care (Signed)
progressing 

## 2018-05-13 NOTE — Progress Notes (Signed)
Orthopaedic Trauma Progress Note  S: Doing well, nerve block wore off this AM. Working with therapy  O:  Vitals:   05/13/18 0028 05/13/18 0428  BP: (!) 99/55 110/73  Pulse: 70 70  Resp:    Temp: 98.3 F (36.8 C) 98.4 F (36.9 C)  SpO2: 96% 95%    Gen: NAD, AAOx3 LUE: Dressing with mild strikethrough. Neurovascularly intact  Imaging: Stable post op imaging  Labs:  Results for orders placed or performed during the hospital encounter of 05/12/18 (from the past 24 hour(s))  CBC     Status: Abnormal   Collection Time: 05/13/18  2:38 AM  Result Value Ref Range   WBC 10.0 4.0 - 10.5 K/uL   RBC 3.42 (L) 4.22 - 5.81 MIL/uL   Hemoglobin 10.9 (L) 13.0 - 17.0 g/dL   HCT 32.2 (L) 39.0 - 52.0 %   MCV 94.2 80.0 - 100.0 fL   MCH 31.9 26.0 - 34.0 pg   MCHC 33.9 30.0 - 36.0 g/dL   RDW 12.4 11.5 - 15.5 %   Platelets 254 150 - 400 K/uL   nRBC 0.0 0.0 - 0.2 %    Assessment: 68 yo male s/p fall  Injuries: Left proximal humerus fx s/p ORIF  Weightbearing: NWB LUE  Insicional and dressing care: Dressing to remain until POD 2/3  Orthopedic device(s):Sling for comfort  CV/Blood loss:Hgb 10.9, stable hemodynamically  Pain management: 1. Tylenol 1000mg  q 6 hours 2. Dilaudid 0.5-1mg  q 4 hours PRN 3. Robaxin 500mg  q 6 hours\ 4. Oxycodone 5-10 mg q 4 hours PRN 5. Percoet 1-2 q6 hours PRN severe pain  VTE prophylaxis: Lovenox 40 mg  ID: Ancef postoperatiely  Foley/Lines: KVO IVF  Medical co-morbidities: Metastatic prostate cancer  Dispo: Therapy eval and home today  Follow - up plan: Dr. Marcelino Scot 10-14 days   Shona Needles, MD [facility] [phone] (phone)

## 2018-05-15 ENCOUNTER — Encounter (HOSPITAL_COMMUNITY): Payer: Self-pay | Admitting: Orthopedic Surgery

## 2018-05-17 ENCOUNTER — Inpatient Hospital Stay: Payer: Medicare Other | Attending: Oncology

## 2018-05-17 DIAGNOSIS — C7951 Secondary malignant neoplasm of bone: Secondary | ICD-10-CM | POA: Diagnosis not present

## 2018-05-17 DIAGNOSIS — Z79899 Other long term (current) drug therapy: Secondary | ICD-10-CM | POA: Insufficient documentation

## 2018-05-17 DIAGNOSIS — C61 Malignant neoplasm of prostate: Secondary | ICD-10-CM | POA: Diagnosis not present

## 2018-05-17 DIAGNOSIS — Z9079 Acquired absence of other genital organ(s): Secondary | ICD-10-CM | POA: Insufficient documentation

## 2018-05-17 DIAGNOSIS — I10 Essential (primary) hypertension: Secondary | ICD-10-CM | POA: Insufficient documentation

## 2018-05-17 LAB — CBC WITH DIFFERENTIAL (CANCER CENTER ONLY)
Abs Immature Granulocytes: 0.04 10*3/uL (ref 0.00–0.07)
Basophils Absolute: 0 10*3/uL (ref 0.0–0.1)
Basophils Relative: 0 %
EOS ABS: 0.2 10*3/uL (ref 0.0–0.5)
Eosinophils Relative: 2 %
HCT: 35.4 % — ABNORMAL LOW (ref 39.0–52.0)
Hemoglobin: 12.1 g/dL — ABNORMAL LOW (ref 13.0–17.0)
IMMATURE GRANULOCYTES: 0 %
LYMPHS ABS: 1.3 10*3/uL (ref 0.7–4.0)
Lymphocytes Relative: 14 %
MCH: 31.8 pg (ref 26.0–34.0)
MCHC: 34.2 g/dL (ref 30.0–36.0)
MCV: 93.2 fL (ref 80.0–100.0)
Monocytes Absolute: 0.5 10*3/uL (ref 0.1–1.0)
Monocytes Relative: 5 %
Neutro Abs: 7.4 10*3/uL (ref 1.7–7.7)
Neutrophils Relative %: 79 %
Platelet Count: 333 10*3/uL (ref 150–400)
RBC: 3.8 MIL/uL — ABNORMAL LOW (ref 4.22–5.81)
RDW: 12.1 % (ref 11.5–15.5)
WBC Count: 9.5 10*3/uL (ref 4.0–10.5)
nRBC: 0 % (ref 0.0–0.2)

## 2018-05-17 LAB — CMP (CANCER CENTER ONLY)
ALT: 19 U/L (ref 0–44)
AST: 24 U/L (ref 15–41)
Albumin: 3.8 g/dL (ref 3.5–5.0)
Alkaline Phosphatase: 64 U/L (ref 38–126)
Anion gap: 11 (ref 5–15)
BUN: 13 mg/dL (ref 8–23)
CALCIUM: 9.3 mg/dL (ref 8.9–10.3)
CO2: 26 mmol/L (ref 22–32)
CREATININE: 0.75 mg/dL (ref 0.61–1.24)
Chloride: 103 mmol/L (ref 98–111)
GFR, Est AFR Am: >60 mL/min (ref >60)
GFR, Estimated: >60 mL/min (ref >60)
Glucose, Bld: 167 mg/dL — ABNORMAL HIGH (ref 70–99)
Potassium: 3.2 mmol/L — ABNORMAL LOW (ref 3.5–5.1)
Sodium: 140 mmol/L (ref 135–145)
Total Bilirubin: 1.4 mg/dL — ABNORMAL HIGH (ref 0.3–1.2)
Total Protein: 6.3 g/dL — ABNORMAL LOW (ref 6.5–8.1)

## 2018-05-18 ENCOUNTER — Inpatient Hospital Stay: Payer: Medicare Other

## 2018-05-18 ENCOUNTER — Inpatient Hospital Stay: Payer: Medicare Other | Admitting: Oncology

## 2018-05-18 LAB — PROSTATE-SPECIFIC AG, SERUM (LABCORP): PROSTATE SPECIFIC AG, SERUM: 23.1 ng/mL — AB (ref 0.0–4.0)

## 2018-05-18 MED FILL — ZYTIGA 500 MG TAB: 500 | 30 days supply | Qty: 60 | Fill #0

## 2018-05-24 DIAGNOSIS — S42211D Unspecified displaced fracture of surgical neck of right humerus, subsequent encounter for fracture with routine healing: Secondary | ICD-10-CM | POA: Diagnosis not present

## 2018-05-24 DIAGNOSIS — S42351D Displaced comminuted fracture of shaft of humerus, right arm, subsequent encounter for fracture with routine healing: Secondary | ICD-10-CM | POA: Diagnosis not present

## 2018-05-25 ENCOUNTER — Other Ambulatory Visit: Payer: Self-pay

## 2018-05-25 ENCOUNTER — Inpatient Hospital Stay: Payer: Medicare Other

## 2018-05-25 ENCOUNTER — Inpatient Hospital Stay (HOSPITAL_BASED_OUTPATIENT_CLINIC_OR_DEPARTMENT_OTHER): Payer: Medicare Other | Admitting: Oncology

## 2018-05-25 ENCOUNTER — Telehealth: Payer: Self-pay | Admitting: Oncology

## 2018-05-25 VITALS — BP 138/94 | HR 80 | Temp 98.1°F | Resp 18 | Ht 70.0 in | Wt 165.8 lb

## 2018-05-25 DIAGNOSIS — C7951 Secondary malignant neoplasm of bone: Secondary | ICD-10-CM

## 2018-05-25 DIAGNOSIS — Z79899 Other long term (current) drug therapy: Secondary | ICD-10-CM

## 2018-05-25 DIAGNOSIS — Z9079 Acquired absence of other genital organ(s): Secondary | ICD-10-CM

## 2018-05-25 DIAGNOSIS — C61 Malignant neoplasm of prostate: Secondary | ICD-10-CM

## 2018-05-25 DIAGNOSIS — I10 Essential (primary) hypertension: Secondary | ICD-10-CM | POA: Diagnosis not present

## 2018-05-25 MED ORDER — DENOSUMAB 120 MG/1.7ML ~~LOC~~ SOLN
SUBCUTANEOUS | Status: AC
  Filled 2018-05-25: qty 1.7

## 2018-05-25 MED ORDER — DENOSUMAB 120 MG/1.7ML ~~LOC~~ SOLN
120.0000 mg | Freq: Once | SUBCUTANEOUS | Status: AC
  Administered 2018-05-25: 120 mg via SUBCUTANEOUS

## 2018-05-25 NOTE — Telephone Encounter (Signed)
Scheduled per los, declined printout  ° °

## 2018-05-25 NOTE — Progress Notes (Signed)
Hematology and Oncology Follow Up Visit  Ethan [name] [id number] Sep 26, 1950 68 y.o. 05/25/2018 1:01 PM [name], Ethan [name], MDRankins, Ethan Salinas, MD   Principle Diagnosis: 68 year old man with advanced prostate cancer with disease to the bone diagnosed in June 2019.  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   Prior Therapy:   He is status post orchiectomy completed by Dr. Jeffie Pollock on September 08, 2017.  Current therapy:  Zytiga 1000 mg daily with prednisone started in July 2019.  Xgeva on 120 mg every 4 to 6 weeks.  Interim History: Ethan [name] is here for a follow-up visit.  Since the last visit, he sustained a fall in February 2020 and sustained a left humerus fracture that required open reduction internal fixation of his left proximal humerus and the humeral shaft.  Since his operation, he reports feeling much better and his pain has improved.  He is no longer requiring any pain medication.  Continues to tolerate Zytiga without any recent complaints.  He denies any edema or nausea or recent hospitalization.  His performance status and activity level remains the same.  Patient denied headaches, blurry vision, syncope or seizures.  Denies any fevers, chills or sweats.  Denied chest pain, palpitation, orthopnea or leg edema.  Denied cough, wheezing or hemoptysis.  Denied nausea, vomiting or abdominal pain.  Denies any constipation or diarrhea.  Denies any frequency urgency or hesitancy.  Denies any arthralgias or myalgias.  Denies any skin rashes or lesions.  Denies any bleeding or clotting tendency.  Denies any easy bruising.  Denies any hair or nail changes.  Denies any anxiety or depression.  Remaining review of system is negative.         Medications: I have reviewed the patient's current medications.  Current Outpatient Medications  Medication Sig Dispense Refill  . Calcium Carb-Cholecalciferol (CALTRATE 600+D3 PO) Take 2 tablets by mouth daily.     Marland Kitchen docusate sodium (COLACE) 100 MG capsule Take 1 capsule (100 mg total) by mouth 2 (two) times daily. 30 capsule 0  . enoxaparin (LOVENOX) 40 MG/0.4ML injection Inject 0.4 mLs (40 mg total) into the skin daily. 14 Syringe 0  . methocarbamol (ROBAXIN) 500 MG tablet Take 1 tablet (500 mg total) by mouth every 6 (six) hours as needed for muscle spasms. 50 tablet 0  . Multiple Vitamin (MULTIVITAMIN) tablet Take 1 tablet by mouth daily.    . ondansetron (ZOFRAN) 4 MG tablet Take 1 tablet (4 mg total) by mouth every 6 (six) hours as needed for nausea. 20 tablet 0  . oxyCODONE (ROXICODONE) 5 MG immediate release tablet Take 1 tablet (5 mg total) by mouth every 8 (eight) hours as needed for breakthrough pain. Take between percocet for breakthrough pain 20 tablet 0  . oxyCODONE-acetaminophen (PERCOCET/ROXICET) 5-325 MG tablet Take 1-2 tablets by mouth every 6 (six) hours as needed for severe pain (pain).    . predniSONE (DELTASONE) 5 MG tablet Take 1 tablet (5 mg total) by mouth daily with breakfast. 90 tablet 3  . tamsulosin (FLOMAX) 0.4 MG CAPS capsule Take 1 capsule (0.4 mg total) by mouth daily after supper. 90 capsule 1  . zolpidem (AMBIEN) 10 MG tablet Take 5 mg by mouth at bedtime as needed for sleep.     Marland Kitchen ZYTIGA 500 MG tablet TAKE 2 TABLETS (1,000 MG TOTAL) BY MOUTH DAILY. TAKE ON AN EMPTY STOMACH 1 HOUR BEFORE OR 2 HOURS AFTER A MEAL. 60 tablet 0   No current  facility-administered medications for this visit.    Facility-Administered Medications Ordered in Other Visits  Medication Dose Route Frequency Provider Last Rate Last Dose  . denosumab (XGEVA) injection 120 mg  120 mg Subcutaneous Once Cline Draheim, Mathis Dad, MD         Allergies: No Known Allergies  Past Medical History, Surgical history, Social history, and Family History were reviewed and updated.    Physical Exam:  Blood pressure (!) 138/94, pulse 80, temperature 98.1 F (36.7 C), temperature source Oral, resp. rate 18, height 5'  10" (1.778 m), weight 165 lb 12.8 oz (75.2 kg), SpO2 98 %.    ECOG: 0   General appearance: Alert, awake without any distress. Head: Atraumatic without abnormalities Oropharynx: Without any thrush or ulcers. Eyes: No scleral icterus. Lymph nodes: No lymphadenopathy noted in the cervical, supraclavicular, or axillary nodes Heart:regular rate and rhythm, without any murmurs or gallops.   Lung: Clear to auscultation without any rhonchi, wheezes or dullness to percussion. Abdomin: Soft, nontender without any shifting dullness or ascites. Musculoskeletal: No clubbing or cyanosis. Neurological: No motor or sensory deficits. Skin: No rashes or lesions.         Lab Results: Lab Results  Component Value Date   WBC 9.5 05/17/2018   HGB 12.1 (L) 05/17/2018   HCT 35.4 (L) 05/17/2018   MCV 93.2 05/17/2018   PLT 333 05/17/2018     Chemistry      Component Value Date/Time   NA 140 05/17/2018 0741   K 3.2 (L) 05/17/2018 0741   CL 103 05/17/2018 0741   CO2 26 05/17/2018 0741   BUN 13 05/17/2018 0741   CREATININE 0.75 05/17/2018 0741      Component Value Date/Time   CALCIUM 9.3 05/17/2018 0741   ALKPHOS 64 05/17/2018 0741   AST 24 05/17/2018 0741   ALT 19 05/17/2018 0741   BILITOT 1.4 (H) 05/17/2018 0741       Results for ANGELL, HONSE "[name]" (MRN [id number]) as of 05/25/2018 13:03  Ref. Range 02/14/2018 12:35 04/14/2018 10:41 05/17/2018 07:41  Prostate Specific Ag, Serum Latest Ref Range: 0.0 - 4.0 ng/mL 13.6 (H) 16.5 (H) 23.1 (H)     Impression and Plan:   68 year old man with the following:  1.    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  He presented with bone involvement in June 2019.  He is currently on Zytiga without any major complications.  His PSA has been slowly rising despite bilateral orchiectomy and Zytiga.  The natural course of this disease and treatment options were reviewed today.  These options would  include enzalutamide, Taxotere chemotherapy and Xofigo.  Risks and benefits and rationale for all these treatments were reviewed today and I favor proceeding with Taxotere chemotherapy.  Complication associated with Taxotere were reviewed today specifically.  These would include nausea, vomiting, myelosuppression and neuropathy.  The plan is to restage him with the next 4 weeks and depending on his cancer status on imaging studies will determine whether to proceed with Taxotere chemotherapy.   2.  Androgen deprivation: He is status post orchiectomy and currently with castrate level testosterone.  3.  Hypertension: Blood pressure is close to normal range at this time.  4.    Bone directed therapy: He is currently on Xgeva which we will continue at this time.  He will receive it every 4 weeks.  Complication associated with this therapy was reiterated again including hypocalcemia and osteonecrosis of the jaw.  5.  Prognosis  and goals of care: Therapy remains palliative although aggressive therapy is warranted given his excellent performance status.  6.  Follow-up: We will be in 4 weeks to follow his progress.  25 minutes was spent with the patient face-to-face today.  More than 50% of time was dedicated to discussing his disease status update, laboratory data review, treatment options and answering questions regarding future plan of care.    Zola Button, MD 3/12/20201:01 PM

## 2018-05-25 NOTE — Patient Instructions (Signed)

## 2018-05-30 DIAGNOSIS — M25612 Stiffness of left shoulder, not elsewhere classified: Secondary | ICD-10-CM | POA: Diagnosis not present

## 2018-05-30 DIAGNOSIS — M25412 Effusion, left shoulder: Secondary | ICD-10-CM | POA: Diagnosis not present

## 2018-05-30 DIAGNOSIS — R531 Weakness: Secondary | ICD-10-CM | POA: Diagnosis not present

## 2018-05-30 DIAGNOSIS — M25512 Pain in left shoulder: Secondary | ICD-10-CM | POA: Diagnosis not present

## 2018-05-31 DIAGNOSIS — M25412 Effusion, left shoulder: Secondary | ICD-10-CM | POA: Diagnosis not present

## 2018-05-31 DIAGNOSIS — R531 Weakness: Secondary | ICD-10-CM | POA: Diagnosis not present

## 2018-05-31 DIAGNOSIS — M25612 Stiffness of left shoulder, not elsewhere classified: Secondary | ICD-10-CM | POA: Diagnosis not present

## 2018-05-31 DIAGNOSIS — M25512 Pain in left shoulder: Secondary | ICD-10-CM | POA: Diagnosis not present

## 2018-06-01 ENCOUNTER — Telehealth: Payer: Self-pay

## 2018-06-01 NOTE — Telephone Encounter (Signed)
Oral Oncology Patient Advocate Encounter  I was successful at securing a grant with [employer] for. This will keep the out of pocket expense for Zytiga at $0. The grant information is as follows and has been shared with [place].  Approval dates: 05/31/18-06/30/18. This is a conditional approval. The patient will get a form in the mail that he will need to fill out and send back before 06/30/18 in order to be approved to have his copays covered for the rest of the year. ID: [id number] Group: [id number] BIN: [id number] PCN: [id number]  I called the patient and gave him this information, he verbalized understanding and great appreciation.  [place] Patient [place] Phone [phone] Fax [phone] 06/01/2018    10:35 AM

## 2018-06-02 DIAGNOSIS — R531 Weakness: Secondary | ICD-10-CM | POA: Diagnosis not present

## 2018-06-02 DIAGNOSIS — M25412 Effusion, left shoulder: Secondary | ICD-10-CM | POA: Diagnosis not present

## 2018-06-02 DIAGNOSIS — M25612 Stiffness of left shoulder, not elsewhere classified: Secondary | ICD-10-CM | POA: Diagnosis not present

## 2018-06-02 DIAGNOSIS — M25512 Pain in left shoulder: Secondary | ICD-10-CM | POA: Diagnosis not present

## 2018-06-05 DIAGNOSIS — M25512 Pain in left shoulder: Secondary | ICD-10-CM | POA: Diagnosis not present

## 2018-06-05 DIAGNOSIS — M25612 Stiffness of left shoulder, not elsewhere classified: Secondary | ICD-10-CM | POA: Diagnosis not present

## 2018-06-05 DIAGNOSIS — R531 Weakness: Secondary | ICD-10-CM | POA: Diagnosis not present

## 2018-06-05 DIAGNOSIS — M25412 Effusion, left shoulder: Secondary | ICD-10-CM | POA: Diagnosis not present

## 2018-06-07 DIAGNOSIS — R531 Weakness: Secondary | ICD-10-CM | POA: Diagnosis not present

## 2018-06-07 DIAGNOSIS — S42351D Displaced comminuted fracture of shaft of humerus, right arm, subsequent encounter for fracture with routine healing: Secondary | ICD-10-CM | POA: Diagnosis not present

## 2018-06-07 DIAGNOSIS — M25412 Effusion, left shoulder: Secondary | ICD-10-CM | POA: Diagnosis not present

## 2018-06-07 DIAGNOSIS — M25512 Pain in left shoulder: Secondary | ICD-10-CM | POA: Diagnosis not present

## 2018-06-07 DIAGNOSIS — M25612 Stiffness of left shoulder, not elsewhere classified: Secondary | ICD-10-CM | POA: Diagnosis not present

## 2018-06-08 ENCOUNTER — Other Ambulatory Visit: Payer: Self-pay | Admitting: Oncology

## 2018-06-08 DIAGNOSIS — C61 Malignant neoplasm of prostate: Secondary | ICD-10-CM

## 2018-06-09 DIAGNOSIS — R531 Weakness: Secondary | ICD-10-CM | POA: Diagnosis not present

## 2018-06-09 DIAGNOSIS — M25412 Effusion, left shoulder: Secondary | ICD-10-CM | POA: Diagnosis not present

## 2018-06-09 DIAGNOSIS — M25612 Stiffness of left shoulder, not elsewhere classified: Secondary | ICD-10-CM | POA: Diagnosis not present

## 2018-06-09 DIAGNOSIS — M25512 Pain in left shoulder: Secondary | ICD-10-CM | POA: Diagnosis not present

## 2018-06-12 DIAGNOSIS — M25512 Pain in left shoulder: Secondary | ICD-10-CM | POA: Diagnosis not present

## 2018-06-12 DIAGNOSIS — M25612 Stiffness of left shoulder, not elsewhere classified: Secondary | ICD-10-CM | POA: Diagnosis not present

## 2018-06-12 DIAGNOSIS — M25412 Effusion, left shoulder: Secondary | ICD-10-CM | POA: Diagnosis not present

## 2018-06-12 DIAGNOSIS — R531 Weakness: Secondary | ICD-10-CM | POA: Diagnosis not present

## 2018-06-14 DIAGNOSIS — R531 Weakness: Secondary | ICD-10-CM | POA: Diagnosis not present

## 2018-06-14 DIAGNOSIS — M25612 Stiffness of left shoulder, not elsewhere classified: Secondary | ICD-10-CM | POA: Diagnosis not present

## 2018-06-14 DIAGNOSIS — M25412 Effusion, left shoulder: Secondary | ICD-10-CM | POA: Diagnosis not present

## 2018-06-14 DIAGNOSIS — M25512 Pain in left shoulder: Secondary | ICD-10-CM | POA: Diagnosis not present

## 2018-06-15 NOTE — Telephone Encounter (Signed)
Oral Oncology Patient Advocate Encounter  TAF approval has been extended through 03/15/19.  [place] Patient [place] Phone [phone] Fax [phone] 06/15/2018   10:54 AM

## 2018-06-16 DIAGNOSIS — M25612 Stiffness of left shoulder, not elsewhere classified: Secondary | ICD-10-CM | POA: Diagnosis not present

## 2018-06-16 DIAGNOSIS — M25512 Pain in left shoulder: Secondary | ICD-10-CM | POA: Diagnosis not present

## 2018-06-16 DIAGNOSIS — M25412 Effusion, left shoulder: Secondary | ICD-10-CM | POA: Diagnosis not present

## 2018-06-16 DIAGNOSIS — R531 Weakness: Secondary | ICD-10-CM | POA: Diagnosis not present

## 2018-06-19 DIAGNOSIS — R531 Weakness: Secondary | ICD-10-CM | POA: Diagnosis not present

## 2018-06-19 DIAGNOSIS — M25412 Effusion, left shoulder: Secondary | ICD-10-CM | POA: Diagnosis not present

## 2018-06-19 DIAGNOSIS — M25612 Stiffness of left shoulder, not elsewhere classified: Secondary | ICD-10-CM | POA: Diagnosis not present

## 2018-06-19 DIAGNOSIS — M25512 Pain in left shoulder: Secondary | ICD-10-CM | POA: Diagnosis not present

## 2018-06-19 MED FILL — ZYTIGA 500 MG TAB: 500 | 30 days supply | Qty: 60 | Fill #0

## 2018-06-21 DIAGNOSIS — R531 Weakness: Secondary | ICD-10-CM | POA: Diagnosis not present

## 2018-06-21 DIAGNOSIS — M25512 Pain in left shoulder: Secondary | ICD-10-CM | POA: Diagnosis not present

## 2018-06-21 DIAGNOSIS — M25612 Stiffness of left shoulder, not elsewhere classified: Secondary | ICD-10-CM | POA: Diagnosis not present

## 2018-06-21 DIAGNOSIS — M25412 Effusion, left shoulder: Secondary | ICD-10-CM | POA: Diagnosis not present

## 2018-06-23 DIAGNOSIS — M25612 Stiffness of left shoulder, not elsewhere classified: Secondary | ICD-10-CM | POA: Diagnosis not present

## 2018-06-23 DIAGNOSIS — M25512 Pain in left shoulder: Secondary | ICD-10-CM | POA: Diagnosis not present

## 2018-06-23 DIAGNOSIS — R531 Weakness: Secondary | ICD-10-CM | POA: Diagnosis not present

## 2018-06-23 DIAGNOSIS — M25412 Effusion, left shoulder: Secondary | ICD-10-CM | POA: Diagnosis not present

## 2018-06-26 DIAGNOSIS — M25412 Effusion, left shoulder: Secondary | ICD-10-CM | POA: Diagnosis not present

## 2018-06-26 DIAGNOSIS — M25512 Pain in left shoulder: Secondary | ICD-10-CM | POA: Diagnosis not present

## 2018-06-26 DIAGNOSIS — M25612 Stiffness of left shoulder, not elsewhere classified: Secondary | ICD-10-CM | POA: Diagnosis not present

## 2018-06-26 DIAGNOSIS — R531 Weakness: Secondary | ICD-10-CM | POA: Diagnosis not present

## 2018-06-27 DIAGNOSIS — R531 Weakness: Secondary | ICD-10-CM | POA: Diagnosis not present

## 2018-06-27 DIAGNOSIS — M25612 Stiffness of left shoulder, not elsewhere classified: Secondary | ICD-10-CM | POA: Diagnosis not present

## 2018-06-27 DIAGNOSIS — M25412 Effusion, left shoulder: Secondary | ICD-10-CM | POA: Diagnosis not present

## 2018-06-27 DIAGNOSIS — M25512 Pain in left shoulder: Secondary | ICD-10-CM | POA: Diagnosis not present

## 2018-06-28 ENCOUNTER — Ambulatory Visit (HOSPITAL_COMMUNITY)
Admission: RE | Admit: 2018-06-28 | Discharge: 2018-06-28 | Disposition: A | Discharge status: Home / Self Care | Payer: Medicare Other | Source: Ambulatory Visit | Attending: Oncology | Admitting: Oncology

## 2018-06-28 ENCOUNTER — Inpatient Hospital Stay: Payer: Medicare Other | Attending: Oncology

## 2018-06-28 ENCOUNTER — Other Ambulatory Visit: Payer: Self-pay

## 2018-06-28 DIAGNOSIS — Z79899 Other long term (current) drug therapy: Secondary | ICD-10-CM | POA: Insufficient documentation

## 2018-06-28 DIAGNOSIS — C61 Malignant neoplasm of prostate: Secondary | ICD-10-CM | POA: Diagnosis not present

## 2018-06-28 DIAGNOSIS — C7951 Secondary malignant neoplasm of bone: Secondary | ICD-10-CM | POA: Insufficient documentation

## 2018-06-28 DIAGNOSIS — Z9079 Acquired absence of other genital organ(s): Secondary | ICD-10-CM | POA: Insufficient documentation

## 2018-06-28 LAB — CMP (CANCER CENTER ONLY)
ALT: 13 U/L (ref 0–44)
AST: 18 U/L (ref 15–41)
Albumin: 4.4 g/dL (ref 3.5–5.0)
Alkaline Phosphatase: 72 U/L (ref 38–126)
Anion gap: 12 (ref 5–15)
BUN: 16 mg/dL (ref 8–23)
CO2: 25 mmol/L (ref 22–32)
Calcium: 8.1 mg/dL — ABNORMAL LOW (ref 8.9–10.3)
Chloride: 106 mmol/L (ref 98–111)
Creatinine: 0.72 mg/dL (ref 0.61–1.24)
GFR, Est AFR Am: >60 mL/min (ref >60)
GFR, Estimated: >60 mL/min (ref >60)
Glucose, Bld: 119 mg/dL — ABNORMAL HIGH (ref 70–99)
Potassium: 3.8 mmol/L (ref 3.5–5.1)
Sodium: 143 mmol/L (ref 135–145)
Total Bilirubin: 0.9 mg/dL (ref 0.3–1.2)
Total Protein: 7.3 g/dL (ref 6.5–8.1)

## 2018-06-28 LAB — CBC WITH DIFFERENTIAL (CANCER CENTER ONLY)
Abs Immature Granulocytes: 0.03 10*3/uL (ref 0.00–0.07)
Basophils Absolute: 0 10*3/uL (ref 0.0–0.1)
Basophils Relative: 0 %
Eosinophils Absolute: 0.1 10*3/uL (ref 0.0–0.5)
Eosinophils Relative: 2 %
HCT: 43.6 % (ref 39.0–52.0)
Hemoglobin: 14.4 g/dL (ref 13.0–17.0)
Immature Granulocytes: 0 %
Lymphocytes Relative: 17 %
Lymphs Abs: 1.5 10*3/uL (ref 0.7–4.0)
MCH: 31.7 pg (ref 26.0–34.0)
MCHC: 33 g/dL (ref 30.0–36.0)
MCV: 96 fL (ref 80.0–100.0)
Monocytes Absolute: 0.6 10*3/uL (ref 0.1–1.0)
Monocytes Relative: 6 %
Neutro Abs: 6.6 10*3/uL (ref 1.7–7.7)
Neutrophils Relative %: 75 %
Platelet Count: 267 10*3/uL (ref 150–400)
RBC: 4.54 MIL/uL (ref 4.22–5.81)
RDW: 11.8 % (ref 11.5–15.5)
WBC Count: 8.9 10*3/uL (ref 4.0–10.5)
nRBC: 0 % (ref 0.0–0.2)

## 2018-06-28 MED ORDER — TECHNETIUM TC 99M MEDRONATE IV KIT
21.5000 | PACK | Freq: Once | INTRAVENOUS | Status: AC | PRN
  Administered 2018-06-28: 21.5 via INTRAVENOUS

## 2018-06-28 MED ORDER — SODIUM CHLORIDE (PF) 0.9 % IJ SOLN
INTRAMUSCULAR | Status: AC
  Filled 2018-06-28: qty 50

## 2018-06-28 MED ORDER — IOHEXOL 300 MG/ML  SOLN
100.0000 mL | Freq: Once | INTRAMUSCULAR | Status: AC | PRN
  Administered 2018-06-28: 100 mL via INTRAVENOUS

## 2018-06-29 DIAGNOSIS — R531 Weakness: Secondary | ICD-10-CM | POA: Diagnosis not present

## 2018-06-29 DIAGNOSIS — M25412 Effusion, left shoulder: Secondary | ICD-10-CM | POA: Diagnosis not present

## 2018-06-29 DIAGNOSIS — M25612 Stiffness of left shoulder, not elsewhere classified: Secondary | ICD-10-CM | POA: Diagnosis not present

## 2018-06-29 DIAGNOSIS — M25512 Pain in left shoulder: Secondary | ICD-10-CM | POA: Diagnosis not present

## 2018-06-29 LAB — PROSTATE-SPECIFIC AG, SERUM (LABCORP): Prostate Specific Ag, Serum: 14.5 ng/mL — ABNORMAL HIGH (ref 0.0–4.0)

## 2018-06-30 ENCOUNTER — Inpatient Hospital Stay (HOSPITAL_BASED_OUTPATIENT_CLINIC_OR_DEPARTMENT_OTHER): Payer: Medicare Other | Admitting: Oncology

## 2018-06-30 ENCOUNTER — Other Ambulatory Visit: Payer: Self-pay

## 2018-06-30 VITALS — BP 138/85 | HR 81 | Temp 98.6°F | Resp 19 | Ht 70.0 in | Wt 172.1 lb

## 2018-06-30 DIAGNOSIS — C7951 Secondary malignant neoplasm of bone: Secondary | ICD-10-CM

## 2018-06-30 DIAGNOSIS — C61 Malignant neoplasm of prostate: Secondary | ICD-10-CM | POA: Diagnosis not present

## 2018-06-30 DIAGNOSIS — Z9079 Acquired absence of other genital organ(s): Secondary | ICD-10-CM | POA: Diagnosis not present

## 2018-06-30 DIAGNOSIS — Z79899 Other long term (current) drug therapy: Secondary | ICD-10-CM

## 2018-06-30 NOTE — Progress Notes (Signed)
Hematology and Oncology Follow Up Visit  Ethan [name] [id number] 1950/08/01 68 y.o. 06/30/2018 2:34 PM [name], Ethan Salinas, MDRankins, Ethan Salinas, MD   Principle Diagnosis: 68 year old man with castration-resistant prostate cancer with disease to the bone diagnosed in 2019.  He presented with Gleason score 4+5 = 9, PSA 542.   Prior Therapy:   He is status post orchiectomy completed by Dr. Jeffie [name] on September 08, 2017.  Current therapy:  Zytiga 1000 mg daily with prednisone started in July 2019.  Xgeva on 120 mg every 4 to 6 weeks.  Interim History: Ethan [name] is here for return evaluation.  Since last visit, he reports no major changes in his health.  He continues to tolerate Zytiga without any recent complaints.  He denies any recent hospitalizations or illnesses.  He denies any lower extremity edema or bone pain.  He denies any urinary complaints or decline in his appetite.  He is eating well and has gained more weight since last visit.   He denied any alteration mental status, neuropathy, confusion or dizziness.  Denies any headaches or lethargy.  Denies any night sweats, weight loss or changes in appetite.  Denied orthopnea, dyspnea on exertion or chest discomfort.  Denies shortness of breath, difficulty breathing hemoptysis or cough.  Denies any abdominal distention, nausea, early satiety or dyspepsia.  Denies any hematuria, frequency, dysuria or nocturia.  Denies any skin irritation, dryness or rash.  Denies any ecchymosis or petechiae.  Denies any lymphadenopathy or clotting.  Denies any heat or cold intolerance.  Denies any anxiety or depression.  Remaining review of system is negative.            Medications: I have reviewed the patient's current medications.  Current Outpatient Medications  Medication Sig Dispense Refill  . Calcium Carb-Cholecalciferol (CALTRATE 600+D3 PO) Take 2 tablets by mouth daily.    Marland Kitchen enoxaparin (LOVENOX) 40 MG/0.4ML injection Inject 0.4 mLs (40 mg  total) into the skin daily. 14 Syringe 0  . Multiple Vitamin (MULTIVITAMIN) tablet Take 1 tablet by mouth daily.    . predniSONE (DELTASONE) 5 MG tablet Take 1 tablet (5 mg total) by mouth daily with breakfast. 90 tablet 3  . tamsulosin (FLOMAX) 0.4 MG CAPS capsule Take 1 capsule (0.4 mg total) by mouth daily after supper. 90 capsule 1  . zolpidem (AMBIEN) 10 MG tablet Take 5 mg by mouth at bedtime as needed for sleep.     Marland Kitchen ZYTIGA 500 MG tablet TAKE 2 TABLETS (1,000 MG TOTAL) BY MOUTH DAILY. TAKE ON AN EMPTY STOMACH 1 HOUR BEFORE OR 2 HOURS AFTER A MEAL. 60 tablet 0   No current facility-administered medications for this visit.    Facility-Administered Medications Ordered in Other Visits  Medication Dose Route Frequency Provider Last Rate Last Dose  . denosumab (XGEVA) injection 120 mg  120 mg Subcutaneous Once Ethan [name], Mathis Dad, MD         Allergies: No Known Allergies  Past Medical History, Surgical history, Social history, and Family History were reviewed and updated.    Physical Exam:   Blood pressure 138/85, pulse 81, temperature 98.6 F (37 C), temperature source Oral, resp. rate 19, height 5\' 10"  (1.778 m), weight 172 lb 1.6 oz (78.1 kg), SpO2 99 %.    ECOG: 0   General appearance: Alert, awake without any distress. Head: Atraumatic without abnormalities Oropharynx: Without any thrush or ulcers. Eyes: No scleral icterus. Lymph nodes: No lymphadenopathy noted in the cervical, supraclavicular, or axillary nodes Heart:regular rate  and rhythm, without any murmurs or gallops.   Lung: Clear to auscultation without any rhonchi, wheezes or dullness to percussion. Abdomin: Soft, nontender without any shifting dullness or ascites. Musculoskeletal: No clubbing or cyanosis. Neurological: No motor or sensory deficits. Skin: No rashes or lesions.         Lab Results: Lab Results  Component Value Date   WBC 8.9 06/28/2018   HGB 14.4 06/28/2018   HCT 43.6 06/28/2018    MCV 96.0 06/28/2018   PLT 267 06/28/2018     Chemistry      Component Value Date/Time   NA 143 06/28/2018 0932   K 3.8 06/28/2018 0932   CL 106 06/28/2018 0932   CO2 25 06/28/2018 0932   BUN 16 06/28/2018 0932   CREATININE 0.72 06/28/2018 0932      Component Value Date/Time   CALCIUM 8.1 (L) 06/28/2018 0932   ALKPHOS 72 06/28/2018 0932   AST 18 06/28/2018 0932   ALT 13 06/28/2018 0932   BILITOT 0.9 06/28/2018 0932       Results for [name], [name] (MRN [id number]) as of 05/25/2018 13:03  Ref. Range 02/14/2018 12:35 04/14/2018 10:41 05/17/2018 07:41  Prostate Specific Ag, Serum Latest Ref Range: 0.0 - 4.0 ng/mL 13.6 (H) 16.5 (H) 23.1 (H)     Musculoskeletal: Several sclerotic bone metastases in the lumbar spine and pelvis show increased sclerosis consistent with healing. No new bone metastases identified.  IMPRESSION: 1. Decreased bilateral iliac lymphadenopathy since prior study. 2. Increased sclerosis of several bone metastases in lumbar spine and pelvis, consistent with interval healing/response to therapy. 3. Decreased size of prostate gland. 4. No new or progressive metastatic disease identified.  EXAM: NUCLEAR MEDICINE WHOLE BODY BONE SCAN  TECHNIQUE: Whole body anterior and posterior images were obtained approximately 3 hours after intravenous injection of radiopharmaceutical.  RADIOPHARMACEUTICALS:  21.5 mCi Technetium-71m MDP IV  COMPARISON:  CT abdomen pelvis from same day. Bone scan dated September 02, 2017.  FINDINGS: Again seen is abnormal increased radiotracer uptake in the T4 and L4 vertebral bodies, the right lateral fourth rib, left ischium and inferior pubic ramus, and proximal left femur, corresponding to sites of sclerosis seen on prior CT imaging. No new sites of metastatic disease are identified.  New increased radiotracer uptake in the left proximal humerus at the site of recent fracture. Unchanged degenerative uptake at L4-L5  and both shoulders.  Normal physiologic activity is identified within the kidneys and urinary bladder.  IMPRESSION: 1. Unchanged osseous metastases in the T4 and L4 vertebral bodies, right lateral fourth rib, left ischium and inferior pubic ramus, and left proximal femur. No new sites of metastatic disease.  Impression and Plan:   68 year old man with the following:  1.    Castration-resistant prostate cancer with disease to the bone diagnosed in June 2019.     He remains on Zytiga without any major complications.  His PSA has been rising rather rapidly up to 23 in March 4 of 2020.  Repeat PSA on April 15 showed decline however down to 14.5.  CT scan and a bone scan obtained at the same time showed disease to be under excellent control without any disease progression.  Risks and benefits of continuing Zytiga versus switching to systemic chemotherapy was reviewed today.  Given the fact that his disease is under control we have deferred the option of chemotherapy for the time being.  He understands that systemic chemotherapy is likely there in the near future but for the time  being this option will be deferred.   2.  Androgen deprivation: Testosterone level remains at a castrate level after orchiectomy.  3.  Hypertension: No issues reported with his blood pressure at this time.  4.    Bone directed therapy: He will continue to receive Xgeva with every visit.  Next Delton See will be in May 2020.  5.  Prognosis and goals of care: Therapy remains palliative although aggressive measures are warranted given his excellent performance status.  6.  Follow-up: In 4 to 6 weeks to repeat his physical examination laboratory evaluation.  25 minutes was spent with the patient face-to-face today.  More than 50% of time was spent on reviewing his disease status, imaging studies including CT scan and a bone scan as well as answering questions regarding future plan of care.    Zola Button,  MD 4/17/20202:34 PM

## 2018-07-03 ENCOUNTER — Telehealth: Payer: Self-pay | Admitting: Oncology

## 2018-07-03 ENCOUNTER — Encounter: Payer: Self-pay | Admitting: Oncology

## 2018-07-03 DIAGNOSIS — M25412 Effusion, left shoulder: Secondary | ICD-10-CM | POA: Diagnosis not present

## 2018-07-03 DIAGNOSIS — R293 Abnormal posture: Secondary | ICD-10-CM | POA: Diagnosis not present

## 2018-07-03 DIAGNOSIS — Z4789 Encounter for other orthopedic aftercare: Secondary | ICD-10-CM | POA: Diagnosis not present

## 2018-07-03 DIAGNOSIS — M25612 Stiffness of left shoulder, not elsewhere classified: Secondary | ICD-10-CM | POA: Diagnosis not present

## 2018-07-03 DIAGNOSIS — R531 Weakness: Secondary | ICD-10-CM | POA: Diagnosis not present

## 2018-07-03 DIAGNOSIS — M25512 Pain in left shoulder: Secondary | ICD-10-CM | POA: Diagnosis not present

## 2018-07-03 NOTE — Telephone Encounter (Signed)
Scheduled appt fo 5/18 per sch msg. Mailed printout

## 2018-07-04 ENCOUNTER — Telehealth: Payer: Self-pay | Admitting: Oncology

## 2018-07-04 NOTE — Telephone Encounter (Signed)
R/s appt per 4/21 sch message - unable to reach patient . Left message with appt date and time

## 2018-07-05 DIAGNOSIS — M25512 Pain in left shoulder: Secondary | ICD-10-CM | POA: Diagnosis not present

## 2018-07-05 DIAGNOSIS — R293 Abnormal posture: Secondary | ICD-10-CM | POA: Diagnosis not present

## 2018-07-05 DIAGNOSIS — S42351D Displaced comminuted fracture of shaft of humerus, right arm, subsequent encounter for fracture with routine healing: Secondary | ICD-10-CM | POA: Diagnosis not present

## 2018-07-05 DIAGNOSIS — M25612 Stiffness of left shoulder, not elsewhere classified: Secondary | ICD-10-CM | POA: Diagnosis not present

## 2018-07-05 DIAGNOSIS — R531 Weakness: Secondary | ICD-10-CM | POA: Diagnosis not present

## 2018-07-05 DIAGNOSIS — S42211D Unspecified displaced fracture of surgical neck of right humerus, subsequent encounter for fracture with routine healing: Secondary | ICD-10-CM | POA: Diagnosis not present

## 2018-07-05 DIAGNOSIS — M25412 Effusion, left shoulder: Secondary | ICD-10-CM | POA: Diagnosis not present

## 2018-07-05 DIAGNOSIS — Z4789 Encounter for other orthopedic aftercare: Secondary | ICD-10-CM | POA: Diagnosis not present

## 2018-07-07 DIAGNOSIS — R531 Weakness: Secondary | ICD-10-CM | POA: Diagnosis not present

## 2018-07-07 DIAGNOSIS — R293 Abnormal posture: Secondary | ICD-10-CM | POA: Diagnosis not present

## 2018-07-07 DIAGNOSIS — M25612 Stiffness of left shoulder, not elsewhere classified: Secondary | ICD-10-CM | POA: Diagnosis not present

## 2018-07-07 DIAGNOSIS — M25412 Effusion, left shoulder: Secondary | ICD-10-CM | POA: Diagnosis not present

## 2018-07-07 DIAGNOSIS — M25512 Pain in left shoulder: Secondary | ICD-10-CM | POA: Diagnosis not present

## 2018-07-07 DIAGNOSIS — Z4789 Encounter for other orthopedic aftercare: Secondary | ICD-10-CM | POA: Diagnosis not present

## 2018-07-08 ENCOUNTER — Other Ambulatory Visit: Payer: Self-pay | Admitting: Oncology

## 2018-07-08 DIAGNOSIS — C61 Malignant neoplasm of prostate: Secondary | ICD-10-CM

## 2018-07-10 DIAGNOSIS — M25512 Pain in left shoulder: Secondary | ICD-10-CM | POA: Diagnosis not present

## 2018-07-10 DIAGNOSIS — M25612 Stiffness of left shoulder, not elsewhere classified: Secondary | ICD-10-CM | POA: Diagnosis not present

## 2018-07-10 DIAGNOSIS — R531 Weakness: Secondary | ICD-10-CM | POA: Diagnosis not present

## 2018-07-10 DIAGNOSIS — M25412 Effusion, left shoulder: Secondary | ICD-10-CM | POA: Diagnosis not present

## 2018-07-10 DIAGNOSIS — Z4789 Encounter for other orthopedic aftercare: Secondary | ICD-10-CM | POA: Diagnosis not present

## 2018-07-10 DIAGNOSIS — R293 Abnormal posture: Secondary | ICD-10-CM | POA: Diagnosis not present

## 2018-07-12 DIAGNOSIS — M25412 Effusion, left shoulder: Secondary | ICD-10-CM | POA: Diagnosis not present

## 2018-07-12 DIAGNOSIS — R531 Weakness: Secondary | ICD-10-CM | POA: Diagnosis not present

## 2018-07-12 DIAGNOSIS — M25512 Pain in left shoulder: Secondary | ICD-10-CM | POA: Diagnosis not present

## 2018-07-12 DIAGNOSIS — M25612 Stiffness of left shoulder, not elsewhere classified: Secondary | ICD-10-CM | POA: Diagnosis not present

## 2018-07-12 DIAGNOSIS — Z4789 Encounter for other orthopedic aftercare: Secondary | ICD-10-CM | POA: Diagnosis not present

## 2018-07-12 DIAGNOSIS — R293 Abnormal posture: Secondary | ICD-10-CM | POA: Diagnosis not present

## 2018-07-14 DIAGNOSIS — R293 Abnormal posture: Secondary | ICD-10-CM | POA: Diagnosis not present

## 2018-07-14 DIAGNOSIS — Z4789 Encounter for other orthopedic aftercare: Secondary | ICD-10-CM | POA: Diagnosis not present

## 2018-07-14 DIAGNOSIS — R531 Weakness: Secondary | ICD-10-CM | POA: Diagnosis not present

## 2018-07-14 DIAGNOSIS — M25512 Pain in left shoulder: Secondary | ICD-10-CM | POA: Diagnosis not present

## 2018-07-14 DIAGNOSIS — M25612 Stiffness of left shoulder, not elsewhere classified: Secondary | ICD-10-CM | POA: Diagnosis not present

## 2018-07-14 DIAGNOSIS — M25412 Effusion, left shoulder: Secondary | ICD-10-CM | POA: Diagnosis not present

## 2018-07-17 DIAGNOSIS — Z4789 Encounter for other orthopedic aftercare: Secondary | ICD-10-CM | POA: Diagnosis not present

## 2018-07-17 DIAGNOSIS — M25512 Pain in left shoulder: Secondary | ICD-10-CM | POA: Diagnosis not present

## 2018-07-17 DIAGNOSIS — M25612 Stiffness of left shoulder, not elsewhere classified: Secondary | ICD-10-CM | POA: Diagnosis not present

## 2018-07-17 DIAGNOSIS — R293 Abnormal posture: Secondary | ICD-10-CM | POA: Diagnosis not present

## 2018-07-17 DIAGNOSIS — R531 Weakness: Secondary | ICD-10-CM | POA: Diagnosis not present

## 2018-07-17 DIAGNOSIS — M25412 Effusion, left shoulder: Secondary | ICD-10-CM | POA: Diagnosis not present

## 2018-07-19 DIAGNOSIS — M25612 Stiffness of left shoulder, not elsewhere classified: Secondary | ICD-10-CM | POA: Diagnosis not present

## 2018-07-19 DIAGNOSIS — R293 Abnormal posture: Secondary | ICD-10-CM | POA: Diagnosis not present

## 2018-07-19 DIAGNOSIS — I10 Essential (primary) hypertension: Secondary | ICD-10-CM | POA: Diagnosis not present

## 2018-07-19 DIAGNOSIS — Z4789 Encounter for other orthopedic aftercare: Secondary | ICD-10-CM | POA: Diagnosis not present

## 2018-07-19 DIAGNOSIS — M25412 Effusion, left shoulder: Secondary | ICD-10-CM | POA: Diagnosis not present

## 2018-07-19 DIAGNOSIS — M25512 Pain in left shoulder: Secondary | ICD-10-CM | POA: Diagnosis not present

## 2018-07-19 DIAGNOSIS — R531 Weakness: Secondary | ICD-10-CM | POA: Diagnosis not present

## 2018-07-19 MED FILL — ZYTIGA 500 MG TAB: 500 | 30 days supply | Qty: 60 | Fill #0

## 2018-07-21 DIAGNOSIS — M25612 Stiffness of left shoulder, not elsewhere classified: Secondary | ICD-10-CM | POA: Diagnosis not present

## 2018-07-21 DIAGNOSIS — Z4789 Encounter for other orthopedic aftercare: Secondary | ICD-10-CM | POA: Diagnosis not present

## 2018-07-21 DIAGNOSIS — R531 Weakness: Secondary | ICD-10-CM | POA: Diagnosis not present

## 2018-07-21 DIAGNOSIS — R293 Abnormal posture: Secondary | ICD-10-CM | POA: Diagnosis not present

## 2018-07-21 DIAGNOSIS — M25412 Effusion, left shoulder: Secondary | ICD-10-CM | POA: Diagnosis not present

## 2018-07-21 DIAGNOSIS — M25512 Pain in left shoulder: Secondary | ICD-10-CM | POA: Diagnosis not present

## 2018-07-24 DIAGNOSIS — M25612 Stiffness of left shoulder, not elsewhere classified: Secondary | ICD-10-CM | POA: Diagnosis not present

## 2018-07-24 DIAGNOSIS — R293 Abnormal posture: Secondary | ICD-10-CM | POA: Diagnosis not present

## 2018-07-24 DIAGNOSIS — M25412 Effusion, left shoulder: Secondary | ICD-10-CM | POA: Diagnosis not present

## 2018-07-24 DIAGNOSIS — M25512 Pain in left shoulder: Secondary | ICD-10-CM | POA: Diagnosis not present

## 2018-07-24 DIAGNOSIS — R531 Weakness: Secondary | ICD-10-CM | POA: Diagnosis not present

## 2018-07-24 DIAGNOSIS — Z4789 Encounter for other orthopedic aftercare: Secondary | ICD-10-CM | POA: Diagnosis not present

## 2018-07-26 DIAGNOSIS — R531 Weakness: Secondary | ICD-10-CM | POA: Diagnosis not present

## 2018-07-26 DIAGNOSIS — Z4789 Encounter for other orthopedic aftercare: Secondary | ICD-10-CM | POA: Diagnosis not present

## 2018-07-26 DIAGNOSIS — M25612 Stiffness of left shoulder, not elsewhere classified: Secondary | ICD-10-CM | POA: Diagnosis not present

## 2018-07-26 DIAGNOSIS — M25412 Effusion, left shoulder: Secondary | ICD-10-CM | POA: Diagnosis not present

## 2018-07-26 DIAGNOSIS — R293 Abnormal posture: Secondary | ICD-10-CM | POA: Diagnosis not present

## 2018-07-26 DIAGNOSIS — M25512 Pain in left shoulder: Secondary | ICD-10-CM | POA: Diagnosis not present

## 2018-07-28 ENCOUNTER — Other Ambulatory Visit: Payer: Self-pay

## 2018-07-28 ENCOUNTER — Inpatient Hospital Stay: Payer: Medicare Other | Attending: Oncology

## 2018-07-28 DIAGNOSIS — C7951 Secondary malignant neoplasm of bone: Secondary | ICD-10-CM | POA: Diagnosis not present

## 2018-07-28 DIAGNOSIS — Z79899 Other long term (current) drug therapy: Secondary | ICD-10-CM | POA: Diagnosis not present

## 2018-07-28 DIAGNOSIS — I10 Essential (primary) hypertension: Secondary | ICD-10-CM | POA: Insufficient documentation

## 2018-07-28 DIAGNOSIS — C61 Malignant neoplasm of prostate: Secondary | ICD-10-CM | POA: Insufficient documentation

## 2018-07-28 DIAGNOSIS — Z9079 Acquired absence of other genital organ(s): Secondary | ICD-10-CM | POA: Diagnosis not present

## 2018-07-28 LAB — CBC WITH DIFFERENTIAL (CANCER CENTER ONLY)
Abs Immature Granulocytes: 0.02 10*3/uL (ref 0.00–0.07)
Basophils Absolute: 0 10*3/uL (ref 0.0–0.1)
Basophils Relative: 0 %
Eosinophils Absolute: 0.3 10*3/uL (ref 0.0–0.5)
Eosinophils Relative: 4 %
HCT: 39.4 % (ref 39.0–52.0)
Hemoglobin: 13.3 g/dL (ref 13.0–17.0)
Immature Granulocytes: 0 %
Lymphocytes Relative: 31 %
Lymphs Abs: 2.2 10*3/uL (ref 0.7–4.0)
MCH: 31.3 pg (ref 26.0–34.0)
MCHC: 33.8 g/dL (ref 30.0–36.0)
MCV: 92.7 fL (ref 80.0–100.0)
Monocytes Absolute: 0.6 10*3/uL (ref 0.1–1.0)
Monocytes Relative: 8 %
Neutro Abs: 4 10*3/uL (ref 1.7–7.7)
Neutrophils Relative %: 57 %
Platelet Count: 246 10*3/uL (ref 150–400)
RBC: 4.25 MIL/uL (ref 4.22–5.81)
RDW: 11.6 % (ref 11.5–15.5)
WBC Count: 7.1 10*3/uL (ref 4.0–10.5)
nRBC: 0 % (ref 0.0–0.2)

## 2018-07-28 LAB — CMP (CANCER CENTER ONLY)
ALT: 17 U/L (ref 0–44)
AST: 17 U/L (ref 15–41)
Albumin: 4.1 g/dL (ref 3.5–5.0)
Alkaline Phosphatase: 70 U/L (ref 38–126)
Anion gap: 8 (ref 5–15)
BUN: 15 mg/dL (ref 8–23)
CO2: 26 mmol/L (ref 22–32)
Calcium: 8.5 mg/dL — ABNORMAL LOW (ref 8.9–10.3)
Chloride: 108 mmol/L (ref 98–111)
Creatinine: 0.74 mg/dL (ref 0.61–1.24)
GFR, Est AFR Am: >60 mL/min (ref >60)
GFR, Estimated: >60 mL/min (ref >60)
Glucose, Bld: 105 mg/dL — ABNORMAL HIGH (ref 70–99)
Potassium: 3.5 mmol/L (ref 3.5–5.1)
Sodium: 142 mmol/L (ref 135–145)
Total Bilirubin: 0.8 mg/dL (ref 0.3–1.2)
Total Protein: 6.6 g/dL (ref 6.5–8.1)

## 2018-07-29 LAB — PROSTATE-SPECIFIC AG, SERUM (LABCORP): Prostate Specific Ag, Serum: 15.3 ng/mL — ABNORMAL HIGH (ref 0.0–4.0)

## 2018-07-31 ENCOUNTER — Other Ambulatory Visit: Payer: Medicare Other

## 2018-07-31 ENCOUNTER — Ambulatory Visit: Payer: Medicare Other | Admitting: Oncology

## 2018-07-31 ENCOUNTER — Ambulatory Visit: Payer: Medicare Other

## 2018-07-31 ENCOUNTER — Other Ambulatory Visit: Payer: Self-pay

## 2018-07-31 ENCOUNTER — Inpatient Hospital Stay (HOSPITAL_BASED_OUTPATIENT_CLINIC_OR_DEPARTMENT_OTHER): Payer: Medicare Other | Admitting: Oncology

## 2018-07-31 ENCOUNTER — Inpatient Hospital Stay: Payer: Medicare Other

## 2018-07-31 VITALS — BP 155/83 | HR 71 | Temp 98.2°F | Resp 18 | Ht 70.0 in | Wt 172.1 lb

## 2018-07-31 DIAGNOSIS — Z79899 Other long term (current) drug therapy: Secondary | ICD-10-CM | POA: Diagnosis not present

## 2018-07-31 DIAGNOSIS — C7951 Secondary malignant neoplasm of bone: Secondary | ICD-10-CM | POA: Diagnosis not present

## 2018-07-31 DIAGNOSIS — I10 Essential (primary) hypertension: Secondary | ICD-10-CM

## 2018-07-31 DIAGNOSIS — Z9079 Acquired absence of other genital organ(s): Secondary | ICD-10-CM

## 2018-07-31 DIAGNOSIS — C61 Malignant neoplasm of prostate: Secondary | ICD-10-CM

## 2018-07-31 NOTE — Progress Notes (Signed)
Hematology and Oncology Follow Up Visit  Ethan [name] [id number] August 24, 1950 68 y.o. 07/31/2018 10:09 AM [name], [name], MDRankins, [name], MD   Principle Diagnosis: 68 year old man with castration-sensitive prostate cancer presented with Gleason score 4+5 = 9, PSA 542 in 2019.  He has documented disease to the bone at the time of diagnosis.  With disease to the bone diagnosed in 2019.    Prior Therapy:   He is status post orchiectomy completed by Dr. Jeffie Pollock on September 08, 2017.  Current therapy:  Zytiga 1000 mg daily with prednisone started in July 2019.  Xgeva on 120 mg every 4 to 8 weeks.  Interim History: Mr. Glace is here for a follow-up.  Since last visit, he reports no major changes in his health.  He denies any recent hospitalizations or illnesses.  He continues to tolerate Zytiga without any complications.  He did notice increase in his blood pressure that required starting antihypertensive medication by Dr. Radene Ou.  He denies any headaches or dizziness.  He denies any edema or excessive fatigue.  His performance status quality of life remains unchanged.  Patient denied headaches, blurry vision, syncope or seizures.  Denies any fevers, chills or sweats.  Denied chest pain, palpitation, orthopnea or leg edema.  Denied cough, wheezing or hemoptysis.  Denied nausea, vomiting or abdominal pain.  Denies any constipation or diarrhea.  Denies any frequency urgency or hesitancy.  Denies any arthralgias or myalgias.  Denies any skin rashes or lesions.  Denies any bleeding or clotting tendency.  Denies any easy bruising.  Denies any hair or nail changes.  Denies any anxiety or depression.  Remaining review of system is negative.             Medications: I have reviewed the patient's current medications.  Current Outpatient Medications  Medication Sig Dispense Refill  . valsartan (DIOVAN) 80 MG tablet Take 80 mg by mouth daily.    . Calcium Carb-Cholecalciferol (CALTRATE  600+D3 PO) Take 2 tablets by mouth daily.    Marland Kitchen enoxaparin (LOVENOX) 40 MG/0.4ML injection Inject 0.4 mLs (40 mg total) into the skin daily. 14 Syringe 0  . Multiple Vitamin (MULTIVITAMIN) tablet Take 1 tablet by mouth daily.    . predniSONE (DELTASONE) 5 MG tablet Take 1 tablet (5 mg total) by mouth daily with breakfast. 90 tablet 3  . tamsulosin (FLOMAX) 0.4 MG CAPS capsule Take 1 capsule (0.4 mg total) by mouth daily after supper. 90 capsule 1  . zolpidem (AMBIEN) 10 MG tablet Take 5 mg by mouth at bedtime as needed for sleep.     Marland Kitchen ZYTIGA 500 MG tablet TAKE 2 TABLETS (1,000 MG TOTAL) BY MOUTH DAILY. TAKE ON AN EMPTY STOMACH 1 HOUR BEFORE OR 2 HOURS AFTER A MEAL. 60 tablet 0   No current facility-administered medications for this visit.    Facility-Administered Medications Ordered in Other Visits  Medication Dose Route Frequency Provider Last Rate Last Dose  . denosumab (XGEVA) injection 120 mg  120 mg Subcutaneous Once Topacio Cella, Mathis Dad, MD         Allergies: No Known Allergies  Past Medical History, Surgical history, Social history, and Family History were reviewed and updated.    Physical Exam:   Blood pressure (!) 155/83, pulse 71, temperature 98.2 F (36.8 C), temperature source Oral, resp. rate 18, height 5\' 10"  (1.778 m), weight 172 lb 1.6 oz (78.1 kg), SpO2 100 %.    ECOG: 0    General appearance: Comfortable appearing without any  discomfort Head: Normocephalic without any trauma Oropharynx: Mucous membranes are moist and pink without any thrush or ulcers. Eyes: Pupils are equal and round reactive to light. Lymph nodes: No cervical, supraclavicular, inguinal or axillary lymphadenopathy.   Heart:regular rate and rhythm.  S1 and S2 without leg edema. Lung: Clear without any rhonchi or wheezes.  No dullness to percussion. Abdomin: Soft, nontender, nondistended with good bowel sounds.  No hepatosplenomegaly. Musculoskeletal: No joint deformity or effusion.  Full range of  motion noted. Neurological: No deficits noted on motor, sensory and deep tendon reflex exam. Skin: No petechial rash or dryness.  Appeared moist.          Lab Results: Lab Results  Component Value Date   WBC 7.1 07/28/2018   HGB 13.3 07/28/2018   HCT 39.4 07/28/2018   MCV 92.7 07/28/2018   PLT 246 07/28/2018     Chemistry      Component Value Date/Time   NA 142 07/28/2018 0945   K 3.5 07/28/2018 0945   CL 108 07/28/2018 0945   CO2 26 07/28/2018 0945   BUN 15 07/28/2018 0945   CREATININE 0.74 07/28/2018 0945      Component Value Date/Time   CALCIUM 8.5 (L) 07/28/2018 0945   ALKPHOS 70 07/28/2018 0945   AST 17 07/28/2018 0945   ALT 17 07/28/2018 0945   BILITOT 0.8 07/28/2018 0945      Results for Sopp, [name] (MRN [id number]) as of 07/31/2018 10:12  Ref. Range 06/28/2018 09:32 07/28/2018 09:45  Prostate Specific Ag, Serum Latest Ref Range: 0.0 - 4.0 ng/mL 14.5 (H) 15.3 (H)        disease.  Impression and Plan:   68 year old man with the following:  1.    Advanced prostate cancer with disease to the bone diagnosed in June 2019.  He has castration-sensitive disease although may be developing castration-resistant disease.   He continues to tolerate Zytiga without any major complications.  Risks and benefits of continuing this treatment was reviewed today.  Alternative treatment options including Taxotere chemotherapy will be deferred unless he developed castration resistant disease.   2.  Androgen deprivation: Testosterone level remains at a castrate level status post orchiectomy.  3.  Hypertension: Mild elevation in his blood pressure is noted likely related to Zytiga.  I have no objections to adding any antihypertensive medication needed per Dr. Radene Ou.  4.    Bone directed therapy: He is currently on Xgeva although treatment will be deferred given his low calcium levels today.  He is calcium supplement and vitamin D to be continued.  He is  taking adequate supplements at this time.   5.  Prognosis and goals of care: His performance status remain excellent and aggressive therapy is warranted although his disease is incurable.  6.  Follow-up: In June of 03/22/2018.  25 minutes was spent with the patient face-to-face today.  More than 50% of time was dedicated to reviewing his disease status, treatment options answering questions regarding future plan of care.    Zola Button, MD 5/18/202010:09 AM

## 2018-08-02 ENCOUNTER — Telehealth: Payer: Self-pay | Admitting: Oncology

## 2018-08-02 DIAGNOSIS — R531 Weakness: Secondary | ICD-10-CM | POA: Diagnosis not present

## 2018-08-02 DIAGNOSIS — Z4789 Encounter for other orthopedic aftercare: Secondary | ICD-10-CM | POA: Diagnosis not present

## 2018-08-02 DIAGNOSIS — M25412 Effusion, left shoulder: Secondary | ICD-10-CM | POA: Diagnosis not present

## 2018-08-02 DIAGNOSIS — M25612 Stiffness of left shoulder, not elsewhere classified: Secondary | ICD-10-CM | POA: Diagnosis not present

## 2018-08-02 DIAGNOSIS — M25512 Pain in left shoulder: Secondary | ICD-10-CM | POA: Diagnosis not present

## 2018-08-02 DIAGNOSIS — R293 Abnormal posture: Secondary | ICD-10-CM | POA: Diagnosis not present

## 2018-08-02 NOTE — Telephone Encounter (Signed)
Scheduled appt per sch msg. Called and left msg for patient.  °

## 2018-08-02 NOTE — Telephone Encounter (Signed)
Called regarding schedule °

## 2018-08-04 DIAGNOSIS — M25612 Stiffness of left shoulder, not elsewhere classified: Secondary | ICD-10-CM | POA: Diagnosis not present

## 2018-08-04 DIAGNOSIS — R531 Weakness: Secondary | ICD-10-CM | POA: Diagnosis not present

## 2018-08-04 DIAGNOSIS — M25412 Effusion, left shoulder: Secondary | ICD-10-CM | POA: Diagnosis not present

## 2018-08-04 DIAGNOSIS — Z4789 Encounter for other orthopedic aftercare: Secondary | ICD-10-CM | POA: Diagnosis not present

## 2018-08-04 DIAGNOSIS — M25512 Pain in left shoulder: Secondary | ICD-10-CM | POA: Diagnosis not present

## 2018-08-04 DIAGNOSIS — R293 Abnormal posture: Secondary | ICD-10-CM | POA: Diagnosis not present

## 2018-08-09 ENCOUNTER — Other Ambulatory Visit: Payer: Self-pay | Admitting: Oncology

## 2018-08-09 DIAGNOSIS — C61 Malignant neoplasm of prostate: Secondary | ICD-10-CM

## 2018-08-09 DIAGNOSIS — R531 Weakness: Secondary | ICD-10-CM | POA: Diagnosis not present

## 2018-08-09 DIAGNOSIS — Z4789 Encounter for other orthopedic aftercare: Secondary | ICD-10-CM | POA: Diagnosis not present

## 2018-08-09 DIAGNOSIS — M25612 Stiffness of left shoulder, not elsewhere classified: Secondary | ICD-10-CM | POA: Diagnosis not present

## 2018-08-09 DIAGNOSIS — M25512 Pain in left shoulder: Secondary | ICD-10-CM | POA: Diagnosis not present

## 2018-08-09 DIAGNOSIS — R293 Abnormal posture: Secondary | ICD-10-CM | POA: Diagnosis not present

## 2018-08-09 DIAGNOSIS — M25412 Effusion, left shoulder: Secondary | ICD-10-CM | POA: Diagnosis not present

## 2018-08-11 DIAGNOSIS — R531 Weakness: Secondary | ICD-10-CM | POA: Diagnosis not present

## 2018-08-11 DIAGNOSIS — Z4789 Encounter for other orthopedic aftercare: Secondary | ICD-10-CM | POA: Diagnosis not present

## 2018-08-11 DIAGNOSIS — M25512 Pain in left shoulder: Secondary | ICD-10-CM | POA: Diagnosis not present

## 2018-08-11 DIAGNOSIS — M25612 Stiffness of left shoulder, not elsewhere classified: Secondary | ICD-10-CM | POA: Diagnosis not present

## 2018-08-11 DIAGNOSIS — R293 Abnormal posture: Secondary | ICD-10-CM | POA: Diagnosis not present

## 2018-08-11 DIAGNOSIS — M25412 Effusion, left shoulder: Secondary | ICD-10-CM | POA: Diagnosis not present

## 2018-08-15 ENCOUNTER — Other Ambulatory Visit: Payer: Self-pay | Admitting: Oncology

## 2018-08-15 ENCOUNTER — Encounter: Payer: Self-pay | Admitting: Oncology

## 2018-08-15 DIAGNOSIS — D649 Anemia, unspecified: Secondary | ICD-10-CM

## 2018-08-15 DIAGNOSIS — E538 Deficiency of other specified B group vitamins: Secondary | ICD-10-CM

## 2018-08-15 MED ORDER — HYDROCHLOROTHIAZIDE 12.5 MG PO CAPS: 12.5000 mg | ORAL_CAPSULE | Freq: Every day | ORAL | 3 refills | Status: DC

## 2018-08-15 MED ORDER — POTASSIUM CHLORIDE CRYS ER 20 MEQ PO TBCR: 20.0000 meq | EXTENDED_RELEASE_TABLET | Freq: Every day | ORAL | 0 refills | Status: DC

## 2018-08-17 MED FILL — ZYTIGA 500 MG TAB: 500 | 30 days supply | Qty: 60 | Fill #0

## 2018-08-23 DIAGNOSIS — S42351D Displaced comminuted fracture of shaft of humerus, right arm, subsequent encounter for fracture with routine healing: Secondary | ICD-10-CM | POA: Diagnosis not present

## 2018-08-23 DIAGNOSIS — S42211D Unspecified displaced fracture of surgical neck of right humerus, subsequent encounter for fracture with routine healing: Secondary | ICD-10-CM | POA: Diagnosis not present

## 2018-08-24 NOTE — Telephone Encounter (Signed)
Oral Oncology Patient Advocate Encounter:  Received fax from [facility] [facility] stating that the patient's grant will be expiring on 09/23/2018. There is currently no grants open to renew/enroll at this time. Patient is also enrolled for a grant with [employer] that is good through 03/15/19.  9:56 AM Ethan [name], CPhT

## 2018-08-26 ENCOUNTER — Other Ambulatory Visit: Payer: Self-pay | Admitting: Oncology

## 2018-08-31 ENCOUNTER — Other Ambulatory Visit: Payer: Self-pay | Admitting: Oncology

## 2018-09-04 ENCOUNTER — Other Ambulatory Visit: Payer: Self-pay

## 2018-09-04 ENCOUNTER — Inpatient Hospital Stay: Payer: Medicare Other | Attending: Oncology

## 2018-09-04 DIAGNOSIS — E876 Hypokalemia: Secondary | ICD-10-CM | POA: Diagnosis not present

## 2018-09-04 DIAGNOSIS — Z9079 Acquired absence of other genital organ(s): Secondary | ICD-10-CM | POA: Insufficient documentation

## 2018-09-04 DIAGNOSIS — C61 Malignant neoplasm of prostate: Secondary | ICD-10-CM | POA: Diagnosis not present

## 2018-09-04 DIAGNOSIS — C7951 Secondary malignant neoplasm of bone: Secondary | ICD-10-CM | POA: Insufficient documentation

## 2018-09-04 DIAGNOSIS — I10 Essential (primary) hypertension: Secondary | ICD-10-CM | POA: Diagnosis not present

## 2018-09-04 DIAGNOSIS — Z79899 Other long term (current) drug therapy: Secondary | ICD-10-CM | POA: Insufficient documentation

## 2018-09-04 LAB — CBC WITH DIFFERENTIAL (CANCER CENTER ONLY)
Abs Immature Granulocytes: 0.03 10*3/uL (ref 0.00–0.07)
Basophils Absolute: 0 10*3/uL (ref 0.0–0.1)
Basophils Relative: 0 %
Eosinophils Absolute: 0.1 10*3/uL (ref 0.0–0.5)
Eosinophils Relative: 1 %
HCT: 40.4 % (ref 39.0–52.0)
Hemoglobin: 13.8 g/dL (ref 13.0–17.0)
Immature Granulocytes: 0 %
Lymphocytes Relative: 15 %
Lymphs Abs: 1.3 10*3/uL (ref 0.7–4.0)
MCH: 31.2 pg (ref 26.0–34.0)
MCHC: 34.2 g/dL (ref 30.0–36.0)
MCV: 91.2 fL (ref 80.0–100.0)
Monocytes Absolute: 0.5 10*3/uL (ref 0.1–1.0)
Monocytes Relative: 6 %
Neutro Abs: 6.7 10*3/uL (ref 1.7–7.7)
Neutrophils Relative %: 78 %
Platelet Count: 278 10*3/uL (ref 150–400)
RBC: 4.43 MIL/uL (ref 4.22–5.81)
RDW: 11.8 % (ref 11.5–15.5)
WBC Count: 8.6 10*3/uL (ref 4.0–10.5)
nRBC: 0 % (ref 0.0–0.2)

## 2018-09-04 LAB — CMP (CANCER CENTER ONLY)
ALT: 19 U/L (ref 0–44)
AST: 21 U/L (ref 15–41)
Albumin: 4.5 g/dL (ref 3.5–5.0)
Alkaline Phosphatase: 64 U/L (ref 38–126)
Anion gap: 9 (ref 5–15)
BUN: 25 mg/dL — ABNORMAL HIGH (ref 8–23)
CO2: 27 mmol/L (ref 22–32)
Calcium: 9.2 mg/dL (ref 8.9–10.3)
Chloride: 105 mmol/L (ref 98–111)
Creatinine: 0.82 mg/dL (ref 0.61–1.24)
GFR, Est AFR Am: >60 mL/min (ref >60)
GFR, Estimated: >60 mL/min (ref >60)
Glucose, Bld: 117 mg/dL — ABNORMAL HIGH (ref 70–99)
Potassium: 4 mmol/L (ref 3.5–5.1)
Sodium: 141 mmol/L (ref 135–145)
Total Bilirubin: 0.7 mg/dL (ref 0.3–1.2)
Total Protein: 6.9 g/dL (ref 6.5–8.1)

## 2018-09-05 ENCOUNTER — Telehealth: Payer: Self-pay | Admitting: Oncology

## 2018-09-05 ENCOUNTER — Other Ambulatory Visit: Payer: Self-pay

## 2018-09-05 ENCOUNTER — Inpatient Hospital Stay: Payer: Medicare Other

## 2018-09-05 ENCOUNTER — Inpatient Hospital Stay (HOSPITAL_BASED_OUTPATIENT_CLINIC_OR_DEPARTMENT_OTHER): Payer: Medicare Other | Admitting: Oncology

## 2018-09-05 VITALS — BP 112/72 | HR 75 | Temp 97.8°F | Resp 17 | Ht 70.0 in | Wt 166.5 lb

## 2018-09-05 DIAGNOSIS — Z9079 Acquired absence of other genital organ(s): Secondary | ICD-10-CM | POA: Diagnosis not present

## 2018-09-05 DIAGNOSIS — Z79899 Other long term (current) drug therapy: Secondary | ICD-10-CM | POA: Diagnosis not present

## 2018-09-05 DIAGNOSIS — I10 Essential (primary) hypertension: Secondary | ICD-10-CM

## 2018-09-05 DIAGNOSIS — E876 Hypokalemia: Secondary | ICD-10-CM

## 2018-09-05 DIAGNOSIS — C61 Malignant neoplasm of prostate: Secondary | ICD-10-CM

## 2018-09-05 DIAGNOSIS — C7951 Secondary malignant neoplasm of bone: Secondary | ICD-10-CM

## 2018-09-05 LAB — PROSTATE-SPECIFIC AG, SERUM (LABCORP): Prostate Specific Ag, Serum: 15.9 ng/mL — ABNORMAL HIGH (ref 0.0–4.0)

## 2018-09-05 MED ORDER — DENOSUMAB 120 MG/1.7ML ~~LOC~~ SOLN
120.0000 mg | Freq: Once | SUBCUTANEOUS | Status: AC
  Administered 2018-09-05: 120 mg via SUBCUTANEOUS

## 2018-09-05 NOTE — Progress Notes (Signed)
Hematology and Oncology Follow Up Visit  Ethan [name] [id number] September 01, 1950 68 y.o. 09/05/2018 9:33 AM Ethan [name], MDRankins, [name], MD   Principle Diagnosis: 68 year old man with advanced prostate cancer diagnosed in 2019.  He presented with castration-sensitive disease, Gleason score 4+5 = 9, PSA 542 with bone involvement.  I    Prior Therapy:   He is status post orchiectomy completed by Dr. Jeffie Pollock on September 08, 2017.  Current therapy:  Zytiga 1000 mg daily with prednisone started in July 2019.  Xgeva on 120 mg every 8 weeks.  Interim History: Ethan [name] returns today for a follow-up.  Since last visit, he reports no major changes in his health.  He remains active and continues to attend activities of daily living.  He is exercising more regularly and have lost weight some of it intentionally.  He is eating better without any decline in his appetite.  He continues to tolerate Zytiga without any recent issues.  He denies any bone pain or pathological fractures.  Continues to be on vitamin supplements including calcium and potassium.   He denied any alteration mental status, neuropathy, confusion or dizziness.  Denies any headaches or lethargy.  Denies any night sweats, weight loss or changes in appetite.  Denied orthopnea, dyspnea on exertion or chest discomfort.  Denies shortness of breath, difficulty breathing hemoptysis or cough.  Denies any abdominal distention, nausea, early satiety or dyspepsia.  Denies any hematuria, frequency, dysuria or nocturia.  Denies any skin irritation, dryness or rash.  Denies any ecchymosis or petechiae.  Denies any lymphadenopathy or clotting.  Denies any heat or cold intolerance.  Denies any anxiety or depression.  Remaining review of system is negative.              Medications: I have reviewed the patient's current medications.  Current Outpatient Medications  Medication Sig Dispense Refill  . Calcium Carb-Cholecalciferol  (CALTRATE 600+D3 PO) Take 2 tablets by mouth daily.    Marland Kitchen enoxaparin (LOVENOX) 40 MG/0.4ML injection Inject 0.4 mLs (40 mg total) into the skin daily. 14 Syringe 0  . hydrochlorothiazide (MICROZIDE) 12.5 MG capsule Take 1 capsule (12.5 mg total) by mouth daily. 30 capsule 3  . Multiple Vitamin (MULTIVITAMIN) tablet Take 1 tablet by mouth daily.    . potassium chloride SA (K-DUR) 20 MEQ tablet Take 1 tablet (20 mEq total) by mouth daily. 30 tablet 0  . predniSONE (DELTASONE) 5 MG tablet TAKE 1 TABLET BY MOUTH EVERY DAY WITH BREAKFAST 90 tablet 3  . tamsulosin (FLOMAX) 0.4 MG CAPS capsule TAKE 1 CAPSULE (0.4 MG TOTAL) BY MOUTH DAILY AFTER SUPPER. 90 capsule 1  . valsartan (DIOVAN) 80 MG tablet Take 80 mg by mouth daily.    Marland Kitchen zolpidem (AMBIEN) 10 MG tablet Take 5 mg by mouth at bedtime as needed for sleep.     Marland Kitchen ZYTIGA 500 MG tablet TAKE 2 TABLETS (1,000 MG TOTAL) BY MOUTH DAILY. TAKE ON AN EMPTY STOMACH 1 HOUR BEFORE OR 2 HOURS AFTER A MEAL. 60 tablet 0   No current facility-administered medications for this visit.    Facility-Administered Medications Ordered in Other Visits  Medication Dose Route Frequency Provider Last Rate Last Dose  . denosumab (XGEVA) injection 120 mg  120 mg Subcutaneous Once Yu Cragun, Mathis Dad, MD         Allergies: No Known Allergies  Past Medical History, Surgical history, Social history, and Family History were reviewed and updated.    Physical Exam:   Blood pressure  112/72, pulse 75, temperature 97.8 F (36.6 C), temperature source Oral, resp. rate 17, height 5\' 10"  (1.778 m), weight 166 lb 8 oz (75.5 kg), SpO2 99 %.    ECOG: 0    General appearance: Alert, awake without any distress. Head: Atraumatic without abnormalities Oropharynx: Without any thrush or ulcers. Eyes: No scleral icterus. Lymph nodes: No lymphadenopathy noted in the cervical, supraclavicular, or axillary nodes Heart:regular rate and rhythm, without any murmurs or gallops.   Lung:  Clear to auscultation without any rhonchi, wheezes or dullness to percussion. Abdomin: Soft, nontender without any shifting dullness or ascites. Musculoskeletal: No clubbing or cyanosis. Neurological: No motor or sensory deficits. Skin: No rashes or lesions.         Lab Results: Lab Results  Component Value Date   WBC 8.6 09/04/2018   HGB 13.8 09/04/2018   HCT 40.4 09/04/2018   MCV 91.2 09/04/2018   PLT 278 09/04/2018     Chemistry      Component Value Date/Time   NA 141 09/04/2018 1102   K 4.0 09/04/2018 1102   CL 105 09/04/2018 1102   CO2 27 09/04/2018 1102   BUN 25 (H) 09/04/2018 1102   CREATININE 0.82 09/04/2018 1102      Component Value Date/Time   CALCIUM 9.2 09/04/2018 1102   ALKPHOS 64 09/04/2018 1102   AST 21 09/04/2018 1102   ALT 19 09/04/2018 1102   BILITOT 0.7 09/04/2018 1102       Results for [name], [name] (MRN [id number]) as of 09/05/2018 09:25  Ref. Range 07/28/2018 09:45 09/04/2018 11:02  Prostate Specific Ag, Serum Latest Ref Range: 0.0 - 4.0 ng/mL 15.3 (H) 15.9 (H)      disease.  Impression and Plan:   68 year old man with:  1.    Castration-sensitive advanced prostate cancer diagnosed in June 2019 with disease to the bone.   He is currently on Zytiga which she has tolerated very well.  His PSA continues to be relatively stable.  The natural course of this disease was reviewed today.  Risks and benefits of continuing Zytiga versus switching to alternative therapies were reviewed.  Complications are related due to systemic chemotherapy and Xofigo as alternatives were reviewed again.  At this time is agreeable to continue with Zytiga given stability of his disease.   2.  Androgen deprivation: He is status post orchiectomy without any evidence of testosterone relapse or need for additional androgen deprivation.  3.  Hypertension: His blood pressure is under control.  Hydrochlorothiazide was added to his regimen.  4.    Bone  directed therapy: Risks and benefits of giving Delton See was reviewed today.  Complications including osteonecrosis of the jaw and hypocalcemia were reviewed.  He is agreeable to continue at this time.   5.  Prognosis and goals of care: His disease remains incurable although aggressive therapy is warranted given his excellent performance status.  6.  Hypocalcemia: Related to Xgeva.  He is currently on calcium supplements.  His calcium levels are back to normal.  7.  Hypokalemia: We will continue to monitor his potassium on Zytiga as well as hydrochlorothiazide.  Potassium remains normal.  8.  Follow-up: In August 2018.  25 minutes was spent with the patient face-to-face today.  More than 50% of time was spent on reviewing his disease status, reviewing laboratory data, treatment options and answering questions regarding future plan of care.    Zola Button, MD 6/23/20209:33 AM

## 2018-09-05 NOTE — Addendum Note (Signed)
Addended by: Wyatt Portela on: 09/05/2018 10:44 AM   Modules accepted: Orders

## 2018-09-05 NOTE — Telephone Encounter (Signed)
Scheduled appt per 6/23 los. Printed calendar and avs. °

## 2018-09-05 NOTE — Patient Instructions (Signed)

## 2018-09-06 ENCOUNTER — Other Ambulatory Visit: Payer: Self-pay | Admitting: Oncology

## 2018-09-08 ENCOUNTER — Other Ambulatory Visit: Payer: Self-pay | Admitting: Oncology

## 2018-09-08 DIAGNOSIS — C61 Malignant neoplasm of prostate: Secondary | ICD-10-CM

## 2018-09-19 MED FILL — ZYTIGA 500 MG TAB: 500 | 30 days supply | Qty: 60 | Fill #0

## 2018-10-01 ENCOUNTER — Other Ambulatory Visit: Payer: Self-pay | Admitting: Oncology

## 2018-10-16 ENCOUNTER — Other Ambulatory Visit: Payer: Self-pay | Admitting: Oncology

## 2018-10-16 DIAGNOSIS — C61 Malignant neoplasm of prostate: Secondary | ICD-10-CM

## 2018-10-18 MED FILL — ZYTIGA 500 MG TAB: 500 | 30 days supply | Qty: 60 | Fill #0

## 2018-10-24 ENCOUNTER — Other Ambulatory Visit: Payer: Self-pay

## 2018-10-24 ENCOUNTER — Inpatient Hospital Stay: Payer: Medicare Other | Attending: Oncology

## 2018-10-24 ENCOUNTER — Other Ambulatory Visit: Payer: Self-pay | Admitting: Oncology

## 2018-10-24 DIAGNOSIS — C7951 Secondary malignant neoplasm of bone: Secondary | ICD-10-CM | POA: Diagnosis not present

## 2018-10-24 DIAGNOSIS — C61 Malignant neoplasm of prostate: Secondary | ICD-10-CM | POA: Diagnosis not present

## 2018-10-24 DIAGNOSIS — Z79899 Other long term (current) drug therapy: Secondary | ICD-10-CM | POA: Insufficient documentation

## 2018-10-24 LAB — CBC WITH DIFFERENTIAL (CANCER CENTER ONLY)
Abs Immature Granulocytes: 0.03 10*3/uL (ref 0.00–0.07)
Basophils Absolute: 0 10*3/uL (ref 0.0–0.1)
Basophils Relative: 0 %
Eosinophils Absolute: 0.2 10*3/uL (ref 0.0–0.5)
Eosinophils Relative: 3 %
HCT: 41.2 % (ref 39.0–52.0)
Hemoglobin: 13.8 g/dL (ref 13.0–17.0)
Immature Granulocytes: 0 %
Lymphocytes Relative: 23 %
Lymphs Abs: 1.8 10*3/uL (ref 0.7–4.0)
MCH: 31.3 pg (ref 26.0–34.0)
MCHC: 33.5 g/dL (ref 30.0–36.0)
MCV: 93.4 fL (ref 80.0–100.0)
Monocytes Absolute: 0.6 10*3/uL (ref 0.1–1.0)
Monocytes Relative: 8 %
Neutro Abs: 5.1 10*3/uL (ref 1.7–7.7)
Neutrophils Relative %: 66 %
Platelet Count: 255 10*3/uL (ref 150–400)
RBC: 4.41 MIL/uL (ref 4.22–5.81)
RDW: 11.8 % (ref 11.5–15.5)
WBC Count: 7.7 10*3/uL (ref 4.0–10.5)
nRBC: 0 % (ref 0.0–0.2)

## 2018-10-24 LAB — CMP (CANCER CENTER ONLY)
ALT: 20 U/L (ref 0–44)
AST: 22 U/L (ref 15–41)
Albumin: 4.5 g/dL (ref 3.5–5.0)
Alkaline Phosphatase: 48 U/L (ref 38–126)
Anion gap: 9 (ref 5–15)
BUN: 27 mg/dL — ABNORMAL HIGH (ref 8–23)
CO2: 27 mmol/L (ref 22–32)
Calcium: 9.5 mg/dL (ref 8.9–10.3)
Chloride: 105 mmol/L (ref 98–111)
Creatinine: 0.86 mg/dL (ref 0.61–1.24)
GFR, Est AFR Am: >60 mL/min (ref >60)
GFR, Estimated: >60 mL/min (ref >60)
Glucose, Bld: 130 mg/dL — ABNORMAL HIGH (ref 70–99)
Potassium: 5 mmol/L (ref 3.5–5.1)
Sodium: 141 mmol/L (ref 135–145)
Total Bilirubin: 1.1 mg/dL (ref 0.3–1.2)
Total Protein: 7 g/dL (ref 6.5–8.1)

## 2018-10-25 LAB — PROSTATE-SPECIFIC AG, SERUM (LABCORP): Prostate Specific Ag, Serum: 13.5 ng/mL — ABNORMAL HIGH (ref 0.0–4.0)

## 2018-10-26 ENCOUNTER — Inpatient Hospital Stay: Payer: Medicare Other

## 2018-10-26 ENCOUNTER — Inpatient Hospital Stay (HOSPITAL_BASED_OUTPATIENT_CLINIC_OR_DEPARTMENT_OTHER): Payer: Medicare Other | Admitting: Oncology

## 2018-10-26 ENCOUNTER — Other Ambulatory Visit: Payer: Self-pay

## 2018-10-26 VITALS — BP 136/81 | HR 72 | Temp 98.7°F | Resp 18 | Wt 166.2 lb

## 2018-10-26 DIAGNOSIS — C61 Malignant neoplasm of prostate: Secondary | ICD-10-CM | POA: Diagnosis not present

## 2018-10-26 DIAGNOSIS — Z79899 Other long term (current) drug therapy: Secondary | ICD-10-CM | POA: Diagnosis not present

## 2018-10-26 DIAGNOSIS — C7951 Secondary malignant neoplasm of bone: Secondary | ICD-10-CM | POA: Diagnosis not present

## 2018-10-26 MED ORDER — DENOSUMAB 120 MG/1.7ML ~~LOC~~ SOLN
SUBCUTANEOUS | Status: AC
  Filled 2018-10-26: qty 1.7

## 2018-10-26 MED ORDER — DENOSUMAB 120 MG/1.7ML ~~LOC~~ SOLN
120.0000 mg | Freq: Once | SUBCUTANEOUS | Status: AC
  Administered 2018-10-26: 120 mg via SUBCUTANEOUS

## 2018-10-26 NOTE — Progress Notes (Signed)
Hematology and Oncology Follow Up Visit  Ethan [name] [id number] 08/24/1950 68 y.o. 10/26/2018 1:21 PM [name], Ethan [name], MDRankins, Ethan Salinas, MD   Principle Diagnosis: 68 year old man with castration-sensitive advanced prostate cancer with disease to the bone diagnosed in 2019.  He was found to have Gleason score 4+5 = 9, PSA 542 at the time of diagnosis.  Prior Therapy:   He is status post orchiectomy completed by Dr. Jeffie Pollock on September 08, 2017.  Current therapy:  Zytiga 1000 mg daily with prednisone started in July 2019.  Xgeva on 120 mg every 8 weeks.  Interim History: Ethan [name] is here for repeat evaluation.  Since the last visit, he reports no major changes in his health.  Continues to tolerate Zytiga without any major complaints.  He denies any excessive fatigue tiredness or edema.  He denies any recent hospitalization or illnesses.  He denies any bone pain or pathological fractures.  Patient denied headaches, blurry vision, syncope or seizures.  Denies any fevers, chills or sweats.  Denied chest pain, palpitation, orthopnea or leg edema.  Denied cough, wheezing or hemoptysis.  Denied nausea, vomiting or abdominal pain.  Denies any constipation or diarrhea.  Denies any frequency urgency or hesitancy.  Denies any arthralgias or myalgias.  Denies any skin rashes or lesions.  Denies any bleeding or clotting tendency.  Denies any easy bruising.  Denies any hair or nail changes.  Denies any anxiety or depression.  Remaining review of system is negative.                Medications: Updated today on review. Current Outpatient Medications  Medication Sig Dispense Refill  . Calcium Carb-Cholecalciferol (CALTRATE 600+D3 PO) Take 2 tablets by mouth daily.    Marland Kitchen enoxaparin (LOVENOX) 40 MG/0.4ML injection Inject 0.4 mLs (40 mg total) into the skin daily. 14 Syringe 0  . hydrochlorothiazide (MICROZIDE) 12.5 MG capsule Take 1 capsule (12.5 mg total) by mouth daily. 30 capsule 3  .  KLOR-CON M20 20 MEQ tablet TAKE 1 TABLET BY MOUTH EVERY DAY 30 tablet 0  . Multiple Vitamin (MULTIVITAMIN) tablet Take 1 tablet by mouth daily.    . predniSONE (DELTASONE) 5 MG tablet TAKE 1 TABLET BY MOUTH EVERY DAY WITH BREAKFAST 90 tablet 3  . tamsulosin (FLOMAX) 0.4 MG CAPS capsule TAKE 1 CAPSULE (0.4 MG TOTAL) BY MOUTH DAILY AFTER SUPPER. 90 capsule 1  . valsartan (DIOVAN) 80 MG tablet Take 80 mg by mouth daily.    Marland Kitchen zolpidem (AMBIEN) 10 MG tablet Take 5 mg by mouth at bedtime as needed for sleep.     Marland Kitchen ZYTIGA 500 MG tablet TAKE 2 TABLETS (1,000 MG TOTAL) BY MOUTH DAILY. TAKE ON AN EMPTY STOMACH 1 HOUR BEFORE OR 2 HOURS AFTER A MEAL. 60 tablet 0   No current facility-administered medications for this visit.      Allergies: No Known Allergies  Past Medical History, Surgical history, Social history, and Family History without any changes on review.    Physical Exam:    Blood pressure 136/81, pulse 72, temperature 98.7 F (37.1 C), temperature source Temporal, resp. rate 18, weight 166 lb 4 oz (75.4 kg), SpO2 100 %.    ECOG: 0   General appearance: Comfortable appearing without any discomfort Head: Normocephalic without any trauma Oropharynx: Mucous membranes are moist and pink without any thrush or ulcers. Eyes: Pupils are equal and round reactive to light. Lymph nodes: No cervical, supraclavicular, inguinal or axillary lymphadenopathy.   Heart:regular rate and rhythm.  S1 and S2 without leg edema. Lung: Clear without any rhonchi or wheezes.  No dullness to percussion. Abdomin: Soft, nontender, nondistended with good bowel sounds.  No hepatosplenomegaly. Musculoskeletal: No joint deformity or effusion.  Full range of motion noted. Neurological: No deficits noted on motor, sensory and deep tendon reflex exam. Skin: No petechial rash or dryness.  Appeared moist.           Lab Results: Lab Results  Component Value Date   WBC 7.7 10/24/2018   HGB 13.8 10/24/2018    HCT 41.2 10/24/2018   MCV 93.4 10/24/2018   PLT 255 10/24/2018     Chemistry      Component Value Date/Time   NA 141 10/24/2018 0814   K 5.0 10/24/2018 0814   CL 105 10/24/2018 0814   CO2 27 10/24/2018 0814   BUN 27 (H) 10/24/2018 0814   CREATININE 0.86 10/24/2018 0814      Component Value Date/Time   CALCIUM 9.5 10/24/2018 0814   ALKPHOS 48 10/24/2018 0814   AST 22 10/24/2018 0814   ALT 20 10/24/2018 0814   BILITOT 1.1 10/24/2018 0814        Results for [name], [name] (MRN [id number]) as of 10/26/2018 12:49  Ref. Range 07/28/2018 09:45 09/04/2018 11:02 10/24/2018 08:14  Prostate Specific Ag, Serum Latest Ref Range: 0.0 - 4.0 ng/mL 15.3 (H) 15.9 (H) 13.5 (H)       Impression and Plan:   68 year old man with:  1.    Advanced prostate cancer with disease to the bone and lymphadenopathy diagnosed in June 2019.  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     He continues to tolerate Zytiga reasonably well with reasonable PSA response.  His PSA continues to decline slowly currently at 13.5 and overall stable clinical status.  The natural course of this disease as well as a future treatment options were discussed today in detail.  Salvage therapy using radium, systemic chemotherapy among other options were reviewed.  At this time I recommended continuing Zytiga and defer these options if he develops castration hyper resistant disease.  Is agreeable to continue at this time   2.  Androgen deprivation: He status post orchiectomy without any need for additional androgen deprivation.  3.  Hypertension: No issues or concerns noted about his blood pressure.  Remains close to normal range.  4.    Bone directed therapy: He is currently on Xgeva without any complications.  He remains on calcium supplements as well.  Risks and benefits of continuing this treatment long-term was discussed.  Hypocalcemia and osteonecrosis of the jaw remains potential  complications.   5.  Prognosis and goals of care: His disease is incurable although aggressive measures are warranted.  His performance status remain excellent.   6.  Hypocalcemia: His calcium has been corrected at this time.  7.  Hypokalemia: No issues noted at this time related to [facility].  8.  Follow-up: In 6 to 8 weeks for repeat evaluation.  25 minutes was spent with the patient face-to-face today.  More than 50% of time was dedicated to reviewing laboratory data, the natural course of his disease, treatment options of potential complications related therapy.   Zola Button, MD 8/13/20201:21 PM

## 2018-10-26 NOTE — Patient Instructions (Signed)
Denosumab injection What is this medicine? DENOSUMAB (den oh sue mab) slows bone breakdown. Prolia is used to treat osteoporosis in women after menopause and in men, and in people who are taking corticosteroids for 6 months or more. Xgeva is used to treat a high calcium level due to cancer and to prevent bone fractures and other bone problems caused by multiple myeloma or cancer bone metastases. Xgeva is also used to treat giant cell tumor of the bone. This medicine may be used for other purposes; ask your health care provider or pharmacist if you have questions. COMMON BRAND NAME(S): Prolia, XGEVA What should I tell my health care provider before I take this medicine? They need to know if you have any of these conditions:  dental disease  having surgery or tooth extraction  infection  kidney disease  low levels of calcium or Vitamin D in the blood  malnutrition  on hemodialysis  skin conditions or sensitivity  thyroid or parathyroid disease  an unusual reaction to denosumab, other medicines, foods, dyes, or preservatives  pregnant or trying to get pregnant  breast-feeding How should I use this medicine? This medicine is for injection under the skin. It is given by a health care professional in a hospital or clinic setting. A special MedGuide will be given to you before each treatment. Be sure to read this information carefully each time. For Prolia, talk to your pediatrician regarding the use of this medicine in children. Special care may be needed. For Xgeva, talk to your pediatrician regarding the use of this medicine in children. While this drug may be prescribed for children as young as 13 years for selected conditions, precautions do apply. Overdosage: If you think you have taken too much of this medicine contact a poison control center or emergency room at once. NOTE: This medicine is only for you. Do not share this medicine with others. What if I miss a dose? It is  important not to miss your dose. Call your doctor or health care professional if you are unable to keep an appointment. What may interact with this medicine? Do not take this medicine with any of the following medications:  other medicines containing denosumab This medicine may also interact with the following medications:  medicines that lower your chance of fighting infection  steroid medicines like prednisone or cortisone This list may not describe all possible interactions. Give your health care provider a list of all the medicines, herbs, non-prescription drugs, or dietary supplements you use. Also tell them if you smoke, drink alcohol, or use illegal drugs. Some items may interact with your medicine. What should I watch for while using this medicine? Visit your doctor or health care professional for regular checks on your progress. Your doctor or health care professional may order blood tests and other tests to see how you are doing. Call your doctor or health care professional for advice if you get a fever, chills or sore throat, or other symptoms of a cold or flu. Do not treat yourself. This drug may decrease your body's ability to fight infection. Try to avoid being around people who are sick. You should make sure you get enough calcium and vitamin D while you are taking this medicine, unless your doctor tells you not to. Discuss the foods you eat and the vitamins you take with your health care professional. See your dentist regularly. Brush and floss your teeth as directed. Before you have any dental work done, tell your dentist you are   receiving this medicine. Do not become pregnant while taking this medicine or for 5 months after stopping it. Talk with your doctor or health care professional about your birth control options while taking this medicine. Women should inform their doctor if they wish to become pregnant or think they might be pregnant. There is a potential for serious side  effects to an unborn child. Talk to your health care professional or pharmacist for more information. What side effects may I notice from receiving this medicine? Side effects that you should report to your doctor or health care professional as soon as possible:  allergic reactions like skin rash, itching or hives, swelling of the face, lips, or tongue  bone pain  breathing problems  dizziness  jaw pain, especially after dental work  redness, blistering, peeling of the skin  signs and symptoms of infection like fever or chills; cough; sore throat; pain or trouble passing urine  signs of low calcium like fast heartbeat, muscle cramps or muscle pain; pain, tingling, numbness in the hands or feet; seizures  unusual bleeding or bruising  unusually weak or tired Side effects that usually do not require medical attention (report to your doctor or health care professional if they continue or are bothersome):  constipation  diarrhea  headache  joint pain  loss of appetite  muscle pain  runny nose  tiredness  upset stomach This list may not describe all possible side effects. Call your doctor for medical advice about side effects. You may report side effects to FDA at 1-800-FDA-1088. Where should I keep my medicine? This medicine is only given in a clinic, doctor's office, or other health care setting and will not be stored at home. NOTE: This sheet is a summary. It may not cover all possible information. If you have questions about this medicine, talk to your doctor, pharmacist, or health care provider.  2020 Elsevier/Gold Standard (2017-07-08 16:10:44)

## 2018-10-27 ENCOUNTER — Telehealth: Payer: Self-pay | Admitting: Oncology

## 2018-10-27 ENCOUNTER — Encounter: Payer: Self-pay | Admitting: Oncology

## 2018-10-27 NOTE — Telephone Encounter (Signed)
Called and spoke with patient. Confirmed upcoming appt

## 2018-11-07 ENCOUNTER — Other Ambulatory Visit: Payer: Self-pay | Admitting: Oncology

## 2018-11-13 ENCOUNTER — Other Ambulatory Visit: Payer: Self-pay | Admitting: Oncology

## 2018-11-13 DIAGNOSIS — C61 Malignant neoplasm of prostate: Secondary | ICD-10-CM

## 2018-11-16 MED FILL — ZYTIGA 500 MG TAB: 500 | 30 days supply | Qty: 60 | Fill #0

## 2018-11-18 ENCOUNTER — Other Ambulatory Visit: Payer: Self-pay | Admitting: Oncology

## 2018-11-21 ENCOUNTER — Telehealth: Payer: Self-pay | Admitting: Oncology

## 2018-11-21 NOTE — Telephone Encounter (Signed)
R/s appt per 9/8 los - pt aware of new appt date and time

## 2018-12-06 DIAGNOSIS — Z23 Encounter for immunization: Secondary | ICD-10-CM | POA: Diagnosis not present

## 2018-12-08 ENCOUNTER — Other Ambulatory Visit: Payer: Self-pay

## 2018-12-08 ENCOUNTER — Inpatient Hospital Stay: Payer: Medicare Other | Attending: Oncology

## 2018-12-08 ENCOUNTER — Other Ambulatory Visit: Payer: Self-pay | Admitting: Oncology

## 2018-12-08 DIAGNOSIS — C61 Malignant neoplasm of prostate: Secondary | ICD-10-CM

## 2018-12-08 DIAGNOSIS — C7951 Secondary malignant neoplasm of bone: Secondary | ICD-10-CM | POA: Diagnosis not present

## 2018-12-08 DIAGNOSIS — E876 Hypokalemia: Secondary | ICD-10-CM | POA: Insufficient documentation

## 2018-12-08 DIAGNOSIS — I10 Essential (primary) hypertension: Secondary | ICD-10-CM | POA: Insufficient documentation

## 2018-12-08 DIAGNOSIS — Z9079 Acquired absence of other genital organ(s): Secondary | ICD-10-CM | POA: Diagnosis not present

## 2018-12-08 DIAGNOSIS — Z7901 Long term (current) use of anticoagulants: Secondary | ICD-10-CM | POA: Insufficient documentation

## 2018-12-08 DIAGNOSIS — Z79899 Other long term (current) drug therapy: Secondary | ICD-10-CM | POA: Diagnosis not present

## 2018-12-08 LAB — CMP (CANCER CENTER ONLY)
ALT: 14 U/L (ref 0–44)
AST: 19 U/L (ref 15–41)
Albumin: 4.3 g/dL (ref 3.5–5.0)
Alkaline Phosphatase: 56 U/L (ref 38–126)
Anion gap: 9 (ref 5–15)
BUN: 20 mg/dL (ref 8–23)
CO2: 27 mmol/L (ref 22–32)
Calcium: 9 mg/dL (ref 8.9–10.3)
Chloride: 104 mmol/L (ref 98–111)
Creatinine: 0.78 mg/dL (ref 0.61–1.24)
GFR, Est AFR Am: >60 mL/min (ref >60)
GFR, Estimated: >60 mL/min (ref >60)
Glucose, Bld: 115 mg/dL — ABNORMAL HIGH (ref 70–99)
Potassium: 3.9 mmol/L (ref 3.5–5.1)
Sodium: 140 mmol/L (ref 135–145)
Total Bilirubin: 1 mg/dL (ref 0.3–1.2)
Total Protein: 6.5 g/dL (ref 6.5–8.1)

## 2018-12-08 LAB — CBC WITH DIFFERENTIAL (CANCER CENTER ONLY)
Abs Immature Granulocytes: 0.02 10*3/uL (ref 0.00–0.07)
Basophils Absolute: 0 10*3/uL (ref 0.0–0.1)
Basophils Relative: 0 %
Eosinophils Absolute: 0.2 10*3/uL (ref 0.0–0.5)
Eosinophils Relative: 3 %
HCT: 39.1 % (ref 39.0–52.0)
Hemoglobin: 13.4 g/dL (ref 13.0–17.0)
Immature Granulocytes: 0 %
Lymphocytes Relative: 19 %
Lymphs Abs: 1.3 10*3/uL (ref 0.7–4.0)
MCH: 31.4 pg (ref 26.0–34.0)
MCHC: 34.3 g/dL (ref 30.0–36.0)
MCV: 91.6 fL (ref 80.0–100.0)
Monocytes Absolute: 0.6 10*3/uL (ref 0.1–1.0)
Monocytes Relative: 9 %
Neutro Abs: 4.8 10*3/uL (ref 1.7–7.7)
Neutrophils Relative %: 69 %
Platelet Count: 235 10*3/uL (ref 150–400)
RBC: 4.27 MIL/uL (ref 4.22–5.81)
RDW: 11.7 % (ref 11.5–15.5)
WBC Count: 7 10*3/uL (ref 4.0–10.5)
nRBC: 0 % (ref 0.0–0.2)

## 2018-12-09 LAB — PROSTATE-SPECIFIC AG, SERUM (LABCORP): Prostate Specific Ag, Serum: 11.5 ng/mL — ABNORMAL HIGH (ref 0.0–4.0)

## 2018-12-11 ENCOUNTER — Other Ambulatory Visit: Payer: Medicare Other

## 2018-12-11 ENCOUNTER — Ambulatory Visit: Payer: Medicare Other | Admitting: Oncology

## 2018-12-11 ENCOUNTER — Ambulatory Visit: Payer: Medicare Other

## 2018-12-13 ENCOUNTER — Inpatient Hospital Stay: Payer: Medicare Other

## 2018-12-13 ENCOUNTER — Inpatient Hospital Stay (HOSPITAL_BASED_OUTPATIENT_CLINIC_OR_DEPARTMENT_OTHER): Payer: Medicare Other | Admitting: Oncology

## 2018-12-13 ENCOUNTER — Other Ambulatory Visit: Payer: Self-pay | Admitting: Oncology

## 2018-12-13 ENCOUNTER — Other Ambulatory Visit: Payer: Self-pay

## 2018-12-13 VITALS — BP 106/76 | HR 86 | Temp 98.0°F | Resp 18 | Ht 70.0 in | Wt 166.6 lb

## 2018-12-13 DIAGNOSIS — C61 Malignant neoplasm of prostate: Secondary | ICD-10-CM

## 2018-12-13 DIAGNOSIS — E876 Hypokalemia: Secondary | ICD-10-CM | POA: Diagnosis not present

## 2018-12-13 DIAGNOSIS — C7951 Secondary malignant neoplasm of bone: Secondary | ICD-10-CM | POA: Diagnosis not present

## 2018-12-13 DIAGNOSIS — I10 Essential (primary) hypertension: Secondary | ICD-10-CM | POA: Diagnosis not present

## 2018-12-13 DIAGNOSIS — Z9079 Acquired absence of other genital organ(s): Secondary | ICD-10-CM | POA: Diagnosis not present

## 2018-12-13 MED ORDER — DENOSUMAB 120 MG/1.7ML ~~LOC~~ SOLN
SUBCUTANEOUS | Status: AC
  Filled 2018-12-13: qty 1.7

## 2018-12-13 MED ORDER — DENOSUMAB 120 MG/1.7ML ~~LOC~~ SOLN
120.0000 mg | Freq: Once | SUBCUTANEOUS | Status: AC
  Administered 2018-12-13: 15:00:00 120 mg via SUBCUTANEOUS

## 2018-12-13 NOTE — Progress Notes (Signed)
Hematology and Oncology Follow Up Visit  Ethan [name] [id number] 09-Nov-1950 68 y.o. 12/13/2018 3:13 PM Ethan [name], Ethan [name], MDRankins, Ethan Salinas, MD   Principle Diagnosis: 68 year old man with advanced prostate cancer with disease to the bone diagnosed in 2019.  He has castration-sensitive after presenting with Gleason score 4+5 = 9, PSA 542.   Prior Therapy:   He is status post orchiectomy completed by Dr. Jeffie Pollock on September 08, 2017.  Current therapy:  Zytiga 1000 mg daily with prednisone started in July 2019.  Xgeva on 120 mg every 8 weeks.  Interim History: Ethan [name] presents today for a follow-up.  Since her last visit, he reports no major changes in his health.  He continues to tolerate the Zytiga without any complications.  He denies any nausea, fatigue or edema.  He denies any bone pain or pathological fractures.  He has tolerated Xgeva without any concerns at this time.  He denies any skin rashes or lesions.  He denies any injection related issues.  He denied any alteration mental status, neuropathy, confusion or dizziness.  Denies any headaches or lethargy.  Denies any night sweats, weight loss or changes in appetite.  Denied orthopnea, dyspnea on exertion or chest discomfort.  Denies shortness of breath, difficulty breathing hemoptysis or cough.  Denies any abdominal distention, nausea, early satiety or dyspepsia.  Denies any hematuria, frequency, dysuria or nocturia.  Denies any skin irritation, dryness or rash.  Denies any ecchymosis or petechiae.  Denies any lymphadenopathy or clotting.  Denies any heat or cold intolerance.  Denies any anxiety or depression.  Remaining review of system is negative.                   Medications: Updated without any changes. Current Outpatient Medications  Medication Sig Dispense Refill  . Calcium Carb-Cholecalciferol (CALTRATE 600+D3 PO) Take 2 tablets by mouth daily.    Marland Kitchen enoxaparin (LOVENOX) 40 MG/0.4ML injection Inject 0.4  mLs (40 mg total) into the skin daily. 14 Syringe 0  . hydrochlorothiazide (MICROZIDE) 12.5 MG capsule TAKE 1 CAPSULE BY MOUTH EVERY DAY 90 capsule 1  . KLOR-CON M20 20 MEQ tablet TAKE 1 TABLET BY MOUTH EVERY DAY 30 tablet 0  . Multiple Vitamin (MULTIVITAMIN) tablet Take 1 tablet by mouth daily.    . predniSONE (DELTASONE) 5 MG tablet TAKE 1 TABLET BY MOUTH EVERY DAY WITH BREAKFAST 90 tablet 3  . tamsulosin (FLOMAX) 0.4 MG CAPS capsule TAKE 1 CAPSULE (0.4 MG TOTAL) BY MOUTH DAILY AFTER SUPPER. 90 capsule 1  . valsartan (DIOVAN) 80 MG tablet Take 80 mg by mouth daily.    Marland Kitchen zolpidem (AMBIEN) 10 MG tablet Take 5 mg by mouth at bedtime as needed for sleep.     Marland Kitchen ZYTIGA 500 MG tablet TAKE 2 TABLETS (1,000 MG TOTAL) BY MOUTH DAILY. TAKE ON AN EMPTY STOMACH 1 HOUR BEFORE OR 2 HOURS AFTER A MEAL. 60 tablet 0   No current facility-administered medications for this visit.      Allergies: No Known Allergies  Past Medical History, Surgical history, Social history, and Family History without any changes on review.    Physical Exam:    Blood pressure 106/76, pulse 86, temperature 98 F (36.7 C), temperature source Oral, resp. rate 18, height 5\' 10"  (1.778 m), weight 166 lb 9.6 oz (75.6 kg), SpO2 99 %.    ECOG: 0    General appearance: Alert, awake without any distress. Head: Atraumatic without abnormalities Oropharynx: Without any thrush or ulcers. Eyes:  No scleral icterus. Lymph nodes: No lymphadenopathy noted in the cervical, supraclavicular, or axillary nodes Heart:regular rate and rhythm, without any murmurs or gallops.   Lung: Clear to auscultation without any rhonchi, wheezes or dullness to percussion. Abdomin: Soft, nontender without any shifting dullness or ascites. Musculoskeletal: No clubbing or cyanosis. Neurological: No motor or sensory deficits. Skin: No rashes or lesions.           Lab Results: Lab Results  Component Value Date   WBC 7.0 12/08/2018   HGB 13.4  12/08/2018   HCT 39.1 12/08/2018   MCV 91.6 12/08/2018   PLT 235 12/08/2018     Chemistry      Component Value Date/Time   NA 140 12/08/2018 0808   K 3.9 12/08/2018 0808   CL 104 12/08/2018 0808   CO2 27 12/08/2018 0808   BUN 20 12/08/2018 0808   CREATININE 0.78 12/08/2018 0808      Component Value Date/Time   CALCIUM 9.0 12/08/2018 0808   ALKPHOS 56 12/08/2018 0808   AST 19 12/08/2018 0808   ALT 14 12/08/2018 0808   BILITOT 1.0 12/08/2018 0808        Results for Ethan [name], [name] (MRN [id number]) as of 12/13/2018 15:16  Ref. Range 09/04/2018 11:02 10/24/2018 08:14 12/08/2018 08:08  Prostate Specific Ag, Serum Latest Ref Range: 0.0 - 4.0 ng/mL 15.9 (H) 13.5 (H) 11.5 (H)        Impression and Plan:   68 year old man with:  1.    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  Marland Kitchen     He continues to have excellent response to [facility] with continuous decline in his PSA.  He has tolerated therapy well without any complaints at this time.  Risks and benefits of continuing this therapy as well as alternative options were reviewed.  Given his excellent quality of life and reasonable PSA response I recommended continuing the same dose and schedule.  Systemic chemotherapy will be deferred unless he developed castration resistant disease.   2.  Androgen deprivation: Testosterone level remains castrate level after orchiectomy.  No additional androgen deprivation therapy needed.  3.  Hypertension: Blood pressure continues to be close to normal range on Zytiga.  We will continue to monitor moving forward.  4.    Bone directed therapy: Long-term complication associated with Delton See was reviewed including osteonecrosis of the jaw and hypocalcemia.  Plan is to continue with injection every 6 to 8 weeks.   5.  Prognosis and goals of care: Therapy remains palliative although aggressive measures are warranted at this time.   6.  Hypocalcemia: His  calcium is back to normal and will continue to monitor on Xgeva.  7.  Hypokalemia: His potassium will be monitored on Zytiga without any need for extra supplements.  8.  Follow-up: 8 weeks for repeat evaluation.  25 minutes was spent with the patient face-to-face today.  More than 50% of time was spent on reviewing the natural course of his disease, treatment options and answering questions regarding future plan of care.   Zola Button, MD 9/30/20203:13 PM

## 2018-12-13 NOTE — Patient Instructions (Signed)
Denosumab injection What is this medicine? DENOSUMAB (den oh sue mab) slows bone breakdown. Prolia is used to treat osteoporosis in women after menopause and in men, and in people who are taking corticosteroids for 6 months or more. Xgeva is used to treat a high calcium level due to cancer and to prevent bone fractures and other bone problems caused by multiple myeloma or cancer bone metastases. Xgeva is also used to treat giant cell tumor of the bone. This medicine may be used for other purposes; ask your health care provider or pharmacist if you have questions. COMMON BRAND NAME(S): Prolia, XGEVA What should I tell my health care provider before I take this medicine? They need to know if you have any of these conditions:  dental disease  having surgery or tooth extraction  infection  kidney disease  low levels of calcium or Vitamin D in the blood  malnutrition  on hemodialysis  skin conditions or sensitivity  thyroid or parathyroid disease  an unusual reaction to denosumab, other medicines, foods, dyes, or preservatives  pregnant or trying to get pregnant  breast-feeding How should I use this medicine? This medicine is for injection under the skin. It is given by a health care professional in a hospital or clinic setting. A special MedGuide will be given to you before each treatment. Be sure to read this information carefully each time. For Prolia, talk to your pediatrician regarding the use of this medicine in children. Special care may be needed. For Xgeva, talk to your pediatrician regarding the use of this medicine in children. While this drug may be prescribed for children as young as 13 years for selected conditions, precautions do apply. Overdosage: If you think you have taken too much of this medicine contact a poison control center or emergency room at once. NOTE: This medicine is only for you. Do not share this medicine with others. What if I miss a dose? It is  important not to miss your dose. Call your doctor or health care professional if you are unable to keep an appointment. What may interact with this medicine? Do not take this medicine with any of the following medications:  other medicines containing denosumab This medicine may also interact with the following medications:  medicines that lower your chance of fighting infection  steroid medicines like prednisone or cortisone This list may not describe all possible interactions. Give your health care provider a list of all the medicines, herbs, non-prescription drugs, or dietary supplements you use. Also tell them if you smoke, drink alcohol, or use illegal drugs. Some items may interact with your medicine. What should I watch for while using this medicine? Visit your doctor or health care professional for regular checks on your progress. Your doctor or health care professional may order blood tests and other tests to see how you are doing. Call your doctor or health care professional for advice if you get a fever, chills or sore throat, or other symptoms of a cold or flu. Do not treat yourself. This drug may decrease your body's ability to fight infection. Try to avoid being around people who are sick. You should make sure you get enough calcium and vitamin D while you are taking this medicine, unless your doctor tells you not to. Discuss the foods you eat and the vitamins you take with your health care professional. See your dentist regularly. Brush and floss your teeth as directed. Before you have any dental work done, tell your dentist you are   receiving this medicine. Do not become pregnant while taking this medicine or for 5 months after stopping it. Talk with your doctor or health care professional about your birth control options while taking this medicine. Women should inform their doctor if they wish to become pregnant or think they might be pregnant. There is a potential for serious side  effects to an unborn child. Talk to your health care professional or pharmacist for more information. What side effects may I notice from receiving this medicine? Side effects that you should report to your doctor or health care professional as soon as possible:  allergic reactions like skin rash, itching or hives, swelling of the face, lips, or tongue  bone pain  breathing problems  dizziness  jaw pain, especially after dental work  redness, blistering, peeling of the skin  signs and symptoms of infection like fever or chills; cough; sore throat; pain or trouble passing urine  signs of low calcium like fast heartbeat, muscle cramps or muscle pain; pain, tingling, numbness in the hands or feet; seizures  unusual bleeding or bruising  unusually weak or tired Side effects that usually do not require medical attention (report to your doctor or health care professional if they continue or are bothersome):  constipation  diarrhea  headache  joint pain  loss of appetite  muscle pain  runny nose  tiredness  upset stomach This list may not describe all possible side effects. Call your doctor for medical advice about side effects. You may report side effects to FDA at 1-800-FDA-1088. Where should I keep my medicine? This medicine is only given in a clinic, doctor's office, or other health care setting and will not be stored at home. NOTE: This sheet is a summary. It may not cover all possible information. If you have questions about this medicine, talk to your doctor, pharmacist, or health care provider.  2020 Elsevier/Gold Standard (2017-07-08 16:10:44)

## 2018-12-15 ENCOUNTER — Telehealth: Payer: Self-pay | Admitting: Oncology

## 2018-12-15 NOTE — Telephone Encounter (Signed)
Called and spoke with patient.confirmed appt  °

## 2018-12-18 MED FILL — ZYTIGA 500 MG TAB: 500 | 30 days supply | Qty: 60 | Fill #0

## 2019-01-03 ENCOUNTER — Telehealth: Payer: Self-pay

## 2019-01-03 ENCOUNTER — Encounter: Payer: Self-pay | Admitting: Oncology

## 2019-01-03 NOTE — Telephone Encounter (Signed)
Please see below message. Gwinda Maine, LCSW to call patient to inform him of support group information.

## 2019-01-03 NOTE — Telephone Encounter (Signed)
-----   Message from Kennith Center, LCSW sent at 01/03/2019  2:16 PM EDT ----- Regarding: RE: Patient Advice Request Ethan [name],  The prostate group is on the 3rd Monday of the month from 6-7 via Zoom. I'd be happy to call the patient and let him know.  Lauren ----- Message ----- From: Teodoro Spray, RN Sent: 01/03/2019   1:04 PM EDT To: Kennith Center, LCSW Subject: Patient Advice Request                         Lauren, I was wondering if you could help me. I received the below mychart message from Ethan [name]. Do you know the contact information for the online support groups?  You can either reply to this message or call me at [phone].   Thank you,  Ethan [name], Ethan [name]"  Ethan [name] Cc Phone Number: [phone]   How can I access the Prostate Cancer Support Group online and when is the next meeting?   Thanks,   Ethan [name]

## 2019-01-07 ENCOUNTER — Other Ambulatory Visit: Payer: Self-pay | Admitting: Oncology

## 2019-01-10 ENCOUNTER — Other Ambulatory Visit: Payer: Self-pay | Admitting: Oncology

## 2019-01-10 DIAGNOSIS — C61 Malignant neoplasm of prostate: Secondary | ICD-10-CM

## 2019-01-16 ENCOUNTER — Encounter: Payer: Self-pay | Admitting: Oncology

## 2019-01-16 MED FILL — ZYTIGA 500 MG TAB: 500 | 30 days supply | Qty: 60 | Fill #0

## 2019-01-26 ENCOUNTER — Other Ambulatory Visit: Payer: Self-pay

## 2019-01-26 ENCOUNTER — Telehealth: Payer: Self-pay

## 2019-01-26 ENCOUNTER — Encounter: Payer: Self-pay | Admitting: Oncology

## 2019-01-26 ENCOUNTER — Inpatient Hospital Stay: Payer: Medicare Other | Attending: Oncology

## 2019-01-26 ENCOUNTER — Telehealth: Payer: Self-pay | Admitting: Medical Oncology

## 2019-01-26 ENCOUNTER — Other Ambulatory Visit: Payer: Medicare Other

## 2019-01-26 DIAGNOSIS — Z79899 Other long term (current) drug therapy: Secondary | ICD-10-CM | POA: Diagnosis not present

## 2019-01-26 DIAGNOSIS — E876 Hypokalemia: Secondary | ICD-10-CM | POA: Insufficient documentation

## 2019-01-26 DIAGNOSIS — C7951 Secondary malignant neoplasm of bone: Secondary | ICD-10-CM | POA: Insufficient documentation

## 2019-01-26 DIAGNOSIS — Z7901 Long term (current) use of anticoagulants: Secondary | ICD-10-CM | POA: Insufficient documentation

## 2019-01-26 DIAGNOSIS — C61 Malignant neoplasm of prostate: Secondary | ICD-10-CM | POA: Diagnosis not present

## 2019-01-26 DIAGNOSIS — Z9079 Acquired absence of other genital organ(s): Secondary | ICD-10-CM | POA: Insufficient documentation

## 2019-01-26 DIAGNOSIS — I10 Essential (primary) hypertension: Secondary | ICD-10-CM | POA: Diagnosis not present

## 2019-01-26 LAB — CBC WITH DIFFERENTIAL (CANCER CENTER ONLY)
Abs Immature Granulocytes: 0.02 10*3/uL (ref 0.00–0.07)
Basophils Absolute: 0 10*3/uL (ref 0.0–0.1)
Basophils Relative: 1 %
Eosinophils Absolute: 0.2 10*3/uL (ref 0.0–0.5)
Eosinophils Relative: 3 %
HCT: 41.8 % (ref 39.0–52.0)
Hemoglobin: 14.1 g/dL (ref 13.0–17.0)
Immature Granulocytes: 0 %
Lymphocytes Relative: 21 %
Lymphs Abs: 1.6 10*3/uL (ref 0.7–4.0)
MCH: 31.8 pg (ref 26.0–34.0)
MCHC: 33.7 g/dL (ref 30.0–36.0)
MCV: 94.4 fL (ref 80.0–100.0)
Monocytes Absolute: 0.5 10*3/uL (ref 0.1–1.0)
Monocytes Relative: 7 %
Neutro Abs: 5.3 10*3/uL (ref 1.7–7.7)
Neutrophils Relative %: 68 %
Platelet Count: 268 10*3/uL (ref 150–400)
RBC: 4.43 MIL/uL (ref 4.22–5.81)
RDW: 11.6 % (ref 11.5–15.5)
WBC Count: 7.7 10*3/uL (ref 4.0–10.5)
nRBC: 0 % (ref 0.0–0.2)

## 2019-01-26 LAB — CMP (CANCER CENTER ONLY)
ALT: 19 U/L (ref 0–44)
AST: 22 U/L (ref 15–41)
Albumin: 4.6 g/dL (ref 3.5–5.0)
Alkaline Phosphatase: 54 U/L (ref 38–126)
Anion gap: 11 (ref 5–15)
BUN: 25 mg/dL — ABNORMAL HIGH (ref 8–23)
CO2: 27 mmol/L (ref 22–32)
Calcium: 9 mg/dL (ref 8.9–10.3)
Chloride: 103 mmol/L (ref 98–111)
Creatinine: 0.8 mg/dL (ref 0.61–1.24)
GFR, Est AFR Am: >60 mL/min (ref >60)
GFR, Estimated: >60 mL/min (ref >60)
Glucose, Bld: 122 mg/dL — ABNORMAL HIGH (ref 70–99)
Potassium: 3.8 mmol/L (ref 3.5–5.1)
Sodium: 141 mmol/L (ref 135–145)
Total Bilirubin: 1.5 mg/dL — ABNORMAL HIGH (ref 0.3–1.2)
Total Protein: 7.1 g/dL (ref 6.5–8.1)

## 2019-01-26 NOTE — Telephone Encounter (Signed)
Spoke with patient to inform him, the person that sends out the invite for the support group is out. I will follow up on Monday and make sure he receives an invite. He voiced understanding.

## 2019-01-26 NOTE — Telephone Encounter (Signed)
Patient sent my chart message below. Cira Rue, prostate navigator, notified and will check to see about adding patient to email list for the prostate support group. Patient contacted and notified. Patient instructed to call office with any questions or concerns.       In October I requested information about your Prostate Support Group. I received a call from a nice women and she said an email would be sent advising how to access Zoom for the meeting November 16th. I have not received that email. Perhaps I will this weekend but I wanted to make sure my interest was sincere.   Thanks,   Keynen Cleary

## 2019-01-27 LAB — PROSTATE-SPECIFIC AG, SERUM (LABCORP): Prostate Specific Ag, Serum: 11.6 ng/mL — ABNORMAL HIGH (ref 0.0–4.0)

## 2019-01-30 ENCOUNTER — Ambulatory Visit: Payer: Medicare Other

## 2019-01-30 ENCOUNTER — Inpatient Hospital Stay: Payer: Medicare Other

## 2019-01-30 ENCOUNTER — Other Ambulatory Visit: Payer: Self-pay

## 2019-01-30 ENCOUNTER — Ambulatory Visit: Payer: Medicare Other | Admitting: Oncology

## 2019-01-30 ENCOUNTER — Inpatient Hospital Stay (HOSPITAL_BASED_OUTPATIENT_CLINIC_OR_DEPARTMENT_OTHER): Payer: Medicare Other | Admitting: Oncology

## 2019-01-30 VITALS — BP 114/60 | HR 78 | Temp 98.0°F | Resp 18 | Ht 70.0 in | Wt 169.0 lb

## 2019-01-30 DIAGNOSIS — E876 Hypokalemia: Secondary | ICD-10-CM | POA: Diagnosis not present

## 2019-01-30 DIAGNOSIS — C61 Malignant neoplasm of prostate: Secondary | ICD-10-CM

## 2019-01-30 DIAGNOSIS — Z9079 Acquired absence of other genital organ(s): Secondary | ICD-10-CM | POA: Diagnosis not present

## 2019-01-30 DIAGNOSIS — I10 Essential (primary) hypertension: Secondary | ICD-10-CM | POA: Diagnosis not present

## 2019-01-30 DIAGNOSIS — C7951 Secondary malignant neoplasm of bone: Secondary | ICD-10-CM | POA: Diagnosis not present

## 2019-01-30 MED ORDER — DENOSUMAB 120 MG/1.7ML ~~LOC~~ SOLN
SUBCUTANEOUS | Status: AC
  Filled 2019-01-30: qty 1.7

## 2019-01-30 MED ORDER — DENOSUMAB 120 MG/1.7ML ~~LOC~~ SOLN
120.0000 mg | Freq: Once | SUBCUTANEOUS | Status: AC
  Administered 2019-01-30: 16:00:00 120 mg via SUBCUTANEOUS

## 2019-01-30 NOTE — Patient Instructions (Signed)
[facility] Discharge Instructions for Patients Receiving Chemotherapy  Today you received the following chemotherapy agents:  Denosumab  To help prevent nausea and vomiting after your treatment, we encourage you to take your nausea medication as prescribed.   If you develop nausea and vomiting that is not controlled by your nausea medication, call the clinic.   BELOW ARE SYMPTOMS THAT SHOULD BE REPORTED IMMEDIATELY:  *FEVER GREATER THAN 100.5 F  *CHILLS WITH OR WITHOUT FEVER  NAUSEA AND VOMITING THAT IS NOT CONTROLLED WITH YOUR NAUSEA MEDICATION  *UNUSUAL SHORTNESS OF BREATH  *UNUSUAL BRUISING OR BLEEDING  TENDERNESS IN MOUTH AND THROAT WITH OR WITHOUT PRESENCE OF ULCERS  *URINARY PROBLEMS  *BOWEL PROBLEMS  UNUSUAL RASH Items with * indicate a potential emergency and should be followed up as soon as possible.  Feel free to call the clinic should you have any questions or concerns. The clinic phone number is [phone].  Please show the [place] at check-in to the Emergency Department and triage nurse.

## 2019-01-30 NOTE — Progress Notes (Signed)
Hematology and Oncology Follow Up Visit  Saviour Mathias [id number] Sep 01, 1950 68 y.o. 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  He was found to have Gleason score 4+5 = 9, PSA 542 at the time of diagnosis.  Prior Therapy:   He is status post orchiectomy completed by Dr. Jeffie Pollock on September 08, 2017.  Current therapy:  Zytiga 1000 mg daily with prednisone started in July 2019.  Xgeva on 120 mg every 8 weeks.  Interim History: Mr. Woodlee returns today for a repeat follow-up.  Since the last visit, he reports no major changes in his health.  He denies any recent bone pain or pathological fractures.  He denies any weight loss or appetite changes.  His performance status and quality of life remains unchanged.  He tolerated Zytiga without any issues.  He denies any edema or excessive fatigue.  He does report reasonable appetite and has gained weight.   He denied headaches, blurry vision, syncope or seizures.  Denies any fevers, chills or sweats.  Denied chest pain, palpitation, orthopnea or leg edema.  Denied cough, wheezing or hemoptysis.  Denied nausea, vomiting or abdominal pain.  Denies any constipation or diarrhea.  Denies any frequency urgency or hesitancy.  Denies any arthralgias or myalgias.  Denies any skin rashes or lesions.  Denies any bleeding or clotting tendency.  Denies any easy bruising.  Denies any hair or nail changes.  Denies any anxiety or depression.  Remaining review of system is negative.                     Medications: Without any changes on review. Current Outpatient Medications  Medication Sig Dispense Refill  . Calcium Carb-Cholecalciferol (CALTRATE 600+D3 PO) Take 2 tablets by mouth daily.    Marland Kitchen enoxaparin (LOVENOX) 40 MG/0.4ML injection Inject 0.4 mLs (40 mg total) into the skin daily. 14 Syringe 0  . hydrochlorothiazide  (MICROZIDE) 12.5 MG capsule TAKE 1 CAPSULE BY MOUTH EVERY DAY 90 capsule 1  . KLOR-CON M20 20 MEQ tablet TAKE 1 TABLET BY MOUTH EVERY DAY 30 tablet 0  . Multiple Vitamin (MULTIVITAMIN) tablet Take 1 tablet by mouth daily.    . predniSONE (DELTASONE) 5 MG tablet TAKE 1 TABLET BY MOUTH EVERY DAY WITH BREAKFAST 90 tablet 3  . tamsulosin (FLOMAX) 0.4 MG CAPS capsule TAKE 1 CAPSULE (0.4 MG TOTAL) BY MOUTH DAILY AFTER SUPPER. 90 capsule 1  . valsartan (DIOVAN) 80 MG tablet Take 80 mg by mouth daily.    Marland Kitchen zolpidem (AMBIEN) 10 MG tablet Take 5 mg by mouth at bedtime as needed for sleep.     Marland Kitchen ZYTIGA 500 MG tablet TAKE 2 TABLETS (1,000 MG TOTAL) BY MOUTH DAILY. TAKE ON AN EMPTY STOMACH 1 HOUR BEFORE OR 2 HOURS AFTER A MEAL. 60 tablet 0   No current facility-administered medications for this visit.      Allergies: No Known Allergies  Past Medical History, Surgical history, Social history, and Family History unchanged on review.    Physical Exam:   Blood pressure 114/60, pulse 78, temperature 98 F (36.7 C), temperature source Temporal, resp. rate 18, height 5\' 10"  (1.778 m), weight 169 lb (76.7 kg), SpO2 100 %.      ECOG: 0   General appearance: Comfortable appearing without any discomfort Head: Normocephalic without any trauma Oropharynx: Mucous membranes are moist and pink without any thrush or ulcers. Eyes: Pupils are  equal and round reactive to light. Lymph nodes: No cervical, supraclavicular, inguinal or axillary lymphadenopathy.   Heart:regular rate and rhythm.  S1 and S2 without leg edema. Lung: Clear without any rhonchi or wheezes.  No dullness to percussion. Abdomin: Soft, nontender, nondistended with good bowel sounds.  No hepatosplenomegaly. Musculoskeletal: No joint deformity or effusion.  Full range of motion noted. Neurological: No deficits noted on motor, sensory and deep tendon reflex exam. Skin: No petechial rash or dryness.  Appeared moist.              Lab Results: Lab Results  Component Value Date   WBC 7.7 01/26/2019   HGB 14.1 01/26/2019   HCT 41.8 01/26/2019   MCV 94.4 01/26/2019   PLT 268 01/26/2019     Chemistry      Component Value Date/Time   NA 141 01/26/2019 0758   K 3.8 01/26/2019 0758   CL 103 01/26/2019 0758   CO2 27 01/26/2019 0758   BUN 25 (H) 01/26/2019 0758   CREATININE 0.80 01/26/2019 0758      Component Value Date/Time   CALCIUM 9.0 01/26/2019 0758   ALKPHOS 54 01/26/2019 0758   AST 22 01/26/2019 0758   ALT 19 01/26/2019 0758   BILITOT 1.5 (H) 01/26/2019 0758        Results for DAMYON, DEALBA "[name]" (MRN [id number]) as of 01/30/2019 15:22  Ref. Range 10/27/2017 14:35 12/15/2017 11:36 02/14/2018 12:35 04/14/2018 10:41 05/17/2018 07:41 06/28/2018 09:32 07/28/2018 09:45 09/04/2018 11:02 10/24/2018 08:14 12/08/2018 08:08 01/26/2019 07:58  Prostate Specific Ag, Serum Latest Ref Range: 0.0 - 4.0 ng/mL <0.1 18.2 (H) 13.6 (H) 16.5 (H) 23.1 (H) 14.5 (H) 15.3 (H) 15.9 (H) 13.5 (H) 11.5 (H) 11.6 (H)         Impression and Plan:   68 year old man with:  1.    Advanced prostate cancer with disease to the bone diagnosed in 2019.  He continues to have castration sensitive disease.     He has tolerated Zytiga without any major complications at this time with a PSA continues to be under reasonable control.  Risks and benefits of continuing this therapy versus alternative treatment options were reviewed.  At this time, given the fact that his PSA remains stable no additional or alternative therapy is needed.  Taxotere chemotherapy would be the option moving forward if he develops relapsed disease.  Plan is to repeat imaging studies in March 2021.   2.  Androgen deprivation: Continues to have castrate level testosterone at this time.  No additional androgen deprivation is needed.  3.  Hypertension: His blood pressure is within normal range at this time.  4.    Bone directed therapy: He is  currently on Xgeva without any major complications.  I continue to educate him about potential complications including osteonecrosis of the jaw and hypocalcemia.   5.  Prognosis and goals of care: His disease is incurable.  Aggressive measures are warranted at this time given his reasonable performance status.  6.  Hypocalcemia: Resolved at this time with normal calcium level.    7.  Hypokalemia: No issues reported on Zytiga.  We will continue to monitor and anticipate replacement if needed.  8.  Follow-up: He will return in 2 months for repeat follow-up.  25 minutes was spent with the patient face-to-face today.  More than 50% of time was dedicated to reviewing his disease status, laboratory data review as well as alternative treatment options.   Zola Button, MD 11/17/20203:28 PM

## 2019-01-31 ENCOUNTER — Encounter: Payer: Self-pay | Admitting: Oncology

## 2019-01-31 ENCOUNTER — Other Ambulatory Visit: Payer: Self-pay | Admitting: Oncology

## 2019-01-31 ENCOUNTER — Telehealth: Payer: Self-pay | Admitting: Oncology

## 2019-01-31 NOTE — Telephone Encounter (Signed)
Scheduled appt per 11/17 los.  Spoke with pt and rescheduled lab appt.  Patient aware of his appt dates and time.,

## 2019-02-02 ENCOUNTER — Encounter: Payer: Self-pay | Admitting: Oncology

## 2019-02-12 ENCOUNTER — Other Ambulatory Visit: Payer: Self-pay | Admitting: Oncology

## 2019-02-12 DIAGNOSIS — C61 Malignant neoplasm of prostate: Secondary | ICD-10-CM

## 2019-02-15 MED FILL — ZYTIGA 500 MG TAB: 500 | 30 days supply | Qty: 60 | Fill #0

## 2019-02-25 ENCOUNTER — Other Ambulatory Visit: Payer: Self-pay | Admitting: Oncology

## 2019-03-02 ENCOUNTER — Other Ambulatory Visit: Payer: Self-pay | Admitting: Oncology

## 2019-03-05 ENCOUNTER — Encounter: Payer: Self-pay | Admitting: Oncology

## 2019-03-12 ENCOUNTER — Other Ambulatory Visit: Payer: Self-pay | Admitting: Oncology

## 2019-03-12 DIAGNOSIS — C61 Malignant neoplasm of prostate: Secondary | ICD-10-CM

## 2019-03-15 MED FILL — ZYTIGA 500 MG TAB: 500 | 30 days supply | Qty: 60 | Fill #0

## 2019-03-20 ENCOUNTER — Telehealth: Payer: Self-pay | Admitting: Pharmacy Technician

## 2019-03-20 NOTE — Telephone Encounter (Signed)
Oral Oncology Patient Advocate Encounter  Patient has been approved for copay assistance with [employer] [facility] (TAF).  [employer] [facility] will cover all copayment expenses for Zytiga for the remainder of the calendar year.    The billing information is as follows and has been shared with [place].   Member ID: [id number] Group ID: [id number] PCN: [id number] BIN: [id number] Eligibility Dates: 03/16/2019 to 03/14/2020  Fund: [place] Patient [place] Phone [phone] Fax [phone] 03/20/2019 12:24 PM

## 2019-03-21 ENCOUNTER — Other Ambulatory Visit: Payer: Self-pay | Admitting: Oncology

## 2019-03-26 ENCOUNTER — Other Ambulatory Visit: Payer: Self-pay

## 2019-03-26 ENCOUNTER — Inpatient Hospital Stay: Payer: Medicare Other | Attending: Oncology

## 2019-03-26 DIAGNOSIS — Z79899 Other long term (current) drug therapy: Secondary | ICD-10-CM | POA: Diagnosis not present

## 2019-03-26 DIAGNOSIS — C61 Malignant neoplasm of prostate: Secondary | ICD-10-CM | POA: Diagnosis not present

## 2019-03-26 DIAGNOSIS — C7951 Secondary malignant neoplasm of bone: Secondary | ICD-10-CM | POA: Insufficient documentation

## 2019-03-26 DIAGNOSIS — Z7901 Long term (current) use of anticoagulants: Secondary | ICD-10-CM | POA: Diagnosis not present

## 2019-03-26 DIAGNOSIS — Z9079 Acquired absence of other genital organ(s): Secondary | ICD-10-CM | POA: Insufficient documentation

## 2019-03-26 DIAGNOSIS — E876 Hypokalemia: Secondary | ICD-10-CM | POA: Diagnosis not present

## 2019-03-26 DIAGNOSIS — I10 Essential (primary) hypertension: Secondary | ICD-10-CM | POA: Insufficient documentation

## 2019-03-26 LAB — CBC WITH DIFFERENTIAL (CANCER CENTER ONLY)
Abs Immature Granulocytes: 0.01 10*3/uL (ref 0.00–0.07)
Basophils Absolute: 0 10*3/uL (ref 0.0–0.1)
Basophils Relative: 1 %
Eosinophils Absolute: 0.2 10*3/uL (ref 0.0–0.5)
Eosinophils Relative: 4 %
HCT: 40 % (ref 39.0–52.0)
Hemoglobin: 13.7 g/dL (ref 13.0–17.0)
Immature Granulocytes: 0 %
Lymphocytes Relative: 34 %
Lymphs Abs: 1.9 10*3/uL (ref 0.7–4.0)
MCH: 31.8 pg (ref 26.0–34.0)
MCHC: 34.3 g/dL (ref 30.0–36.0)
MCV: 92.8 fL (ref 80.0–100.0)
Monocytes Absolute: 0.5 10*3/uL (ref 0.1–1.0)
Monocytes Relative: 9 %
Neutro Abs: 2.8 10*3/uL (ref 1.7–7.7)
Neutrophils Relative %: 52 %
Platelet Count: 262 10*3/uL (ref 150–400)
RBC: 4.31 MIL/uL (ref 4.22–5.81)
RDW: 11.6 % (ref 11.5–15.5)
WBC Count: 5.4 10*3/uL (ref 4.0–10.5)
nRBC: 0 % (ref 0.0–0.2)

## 2019-03-26 LAB — CMP (CANCER CENTER ONLY)
ALT: 15 U/L (ref 0–44)
AST: 21 U/L (ref 15–41)
Albumin: 4.2 g/dL (ref 3.5–5.0)
Alkaline Phosphatase: 48 U/L (ref 38–126)
Anion gap: 10 (ref 5–15)
BUN: 16 mg/dL (ref 8–23)
CO2: 27 mmol/L (ref 22–32)
Calcium: 8.7 mg/dL — ABNORMAL LOW (ref 8.9–10.3)
Chloride: 104 mmol/L (ref 98–111)
Creatinine: 0.81 mg/dL (ref 0.61–1.24)
GFR, Est AFR Am: >60 mL/min (ref >60)
GFR, Estimated: >60 mL/min (ref >60)
Glucose, Bld: 111 mg/dL — ABNORMAL HIGH (ref 70–99)
Potassium: 3.6 mmol/L (ref 3.5–5.1)
Sodium: 141 mmol/L (ref 135–145)
Total Bilirubin: 1.1 mg/dL (ref 0.3–1.2)
Total Protein: 6.7 g/dL (ref 6.5–8.1)

## 2019-03-27 ENCOUNTER — Other Ambulatory Visit: Payer: Medicare Other

## 2019-03-27 LAB — PROSTATE-SPECIFIC AG, SERUM (LABCORP): Prostate Specific Ag, Serum: 10.6 ng/mL — ABNORMAL HIGH (ref 0.0–4.0)

## 2019-03-29 ENCOUNTER — Encounter: Payer: Self-pay | Admitting: Oncology

## 2019-03-29 ENCOUNTER — Inpatient Hospital Stay (HOSPITAL_BASED_OUTPATIENT_CLINIC_OR_DEPARTMENT_OTHER): Payer: Medicare Other | Admitting: Oncology

## 2019-03-29 ENCOUNTER — Other Ambulatory Visit: Payer: Self-pay

## 2019-03-29 ENCOUNTER — Inpatient Hospital Stay: Payer: Medicare Other

## 2019-03-29 ENCOUNTER — Telehealth: Payer: Self-pay | Admitting: Oncology

## 2019-03-29 VITALS — BP 121/80 | HR 66 | Temp 98.2°F | Resp 16 | Ht 70.0 in | Wt 174.4 lb

## 2019-03-29 DIAGNOSIS — E876 Hypokalemia: Secondary | ICD-10-CM | POA: Diagnosis not present

## 2019-03-29 DIAGNOSIS — C61 Malignant neoplasm of prostate: Secondary | ICD-10-CM

## 2019-03-29 DIAGNOSIS — Z9079 Acquired absence of other genital organ(s): Secondary | ICD-10-CM | POA: Diagnosis not present

## 2019-03-29 DIAGNOSIS — I10 Essential (primary) hypertension: Secondary | ICD-10-CM | POA: Diagnosis not present

## 2019-03-29 DIAGNOSIS — C7951 Secondary malignant neoplasm of bone: Secondary | ICD-10-CM | POA: Diagnosis not present

## 2019-03-29 NOTE — Progress Notes (Signed)
Hematology and Oncology Follow Up Visit  Ethan [name] [id number] 12-25-1950 69 y.o. 03/29/2019 1:08 PM [name], Ethan [name], MDRankins, Ethan Salinas, MD   Principle Diagnosis: 69 year old man with advanced prostate cancer diagnosed in 2019.  Presented with Gleason score 4+5 = 9, PSA 542 and documented bone disease.  Prior Therapy:   He is status post orchiectomy completed by Dr. Jeffie [name] on September 08, 2017.  Current therapy:  Zytiga 1000 mg daily with prednisone started in July 2019.  Xgeva on 120 mg every 8 weeks.  Interim History: Ethan [name] is here for repeat evaluation.  Since the last visit, he reports no major changes in his health.  He continues to feel well and exercises regularly.  He has increased his cardiovascular exercise as well as resistance training which has helped his overall performance status and activity level.  He is eating well and has tolerated Zytiga without any complications.  Denies any nausea, fatigue or edema.  He denies any recent hospitalization or illnesses.                     Medications: Unchanged on review. Current Outpatient Medications  Medication Sig Dispense Refill  . Calcium Carb-Cholecalciferol (CALTRATE 600+D3 PO) Take 2 tablets by mouth daily.    Marland Kitchen enoxaparin (LOVENOX) 40 MG/0.4ML injection Inject 0.4 mLs (40 mg total) into the skin daily. 14 Syringe 0  . hydrochlorothiazide (MICROZIDE) 12.5 MG capsule TAKE 1 CAPSULE BY MOUTH EVERY DAY 90 capsule 1  . KLOR-CON M20 20 MEQ tablet TAKE 1 TABLET BY MOUTH EVERY DAY 30 tablet 0  . Multiple Vitamin (MULTIVITAMIN) tablet Take 1 tablet by mouth daily.    . predniSONE (DELTASONE) 5 MG tablet TAKE 1 TABLET BY MOUTH EVERY DAY WITH BREAKFAST 90 tablet 3  . tamsulosin (FLOMAX) 0.4 MG CAPS capsule TAKE 1 CAPSULE (0.4 MG TOTAL) BY MOUTH DAILY AFTER SUPPER. 90 capsule 1  . valsartan (DIOVAN) 80 MG tablet Take 80 mg by mouth daily.    Marland Kitchen zolpidem (AMBIEN) 10 MG tablet Take 5 mg by mouth at bedtime  as needed for sleep.     Marland Kitchen ZYTIGA 500 MG tablet TAKE 2 TABLETS (1,000 MG TOTAL) BY MOUTH DAILY. TAKE ON AN EMPTY STOMACH 1 HOUR BEFORE OR 2 HOURS AFTER A MEAL. 60 tablet 0   No current facility-administered medications for this visit.     Allergies: No Known Allergies      Physical Exam:   Blood pressure 121/80, pulse 66, temperature 98.2 F (36.8 C), temperature source Temporal, resp. rate 16, height 5\' 10"  (1.778 m), weight 174 lb 6.4 oz (79.1 kg), SpO2 99 %.       ECOG: 0    General appearance: Alert, awake without any distress. Head: Atraumatic without abnormalities Oropharynx: Without any thrush or ulcers. Eyes: No scleral icterus. Lymph nodes: No lymphadenopathy noted in the cervical, supraclavicular, or axillary nodes Heart:regular rate and rhythm, without any murmurs or gallops.   Lung: Clear to auscultation without any rhonchi, wheezes or dullness to percussion. Abdomin: Soft, nontender without any shifting dullness or ascites. Musculoskeletal: No clubbing or cyanosis. Neurological: No motor or sensory deficits. Skin: No rashes or lesions. .             Lab Results: Lab Results  Component Value Date   WBC 5.4 03/26/2019   HGB 13.7 03/26/2019   HCT 40.0 03/26/2019   MCV 92.8 03/26/2019   PLT 262 03/26/2019     Chemistry  Component Value Date/Time   NA 141 03/26/2019 0802   K 3.6 03/26/2019 0802   CL 104 03/26/2019 0802   CO2 27 03/26/2019 0802   BUN 16 03/26/2019 0802   CREATININE 0.81 03/26/2019 0802      Component Value Date/Time   CALCIUM 8.7 (L) 03/26/2019 0802   ALKPHOS 48 03/26/2019 0802   AST 21 03/26/2019 0802   ALT 15 03/26/2019 0802   BILITOT 1.1 03/26/2019 0802          Results for Ethan [name], [name] (MRN [id number]) as of 03/29/2019 13:07  Ref. Range 01/26/2019 07:58 03/26/2019 08:02  Prostate Specific Ag, Serum Latest Ref Range: 0.0 - 4.0 ng/mL 11.6 (H) 10.6 (H)        Impression and Plan:    69 year old man with:  1.    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      He remains on Zytiga at this time with overall stable disease status.  His PSA continues to slowly decline without any evidence of disease progression.  Risks and benefits of continuing this therapy was reviewed.  Alternative options such as systemic chemotherapy is deferred for now unless he developed castration-resistant disease.  The time being he is a agreeable to continue.   2.  Androgen deprivation: He is status post orchiectomy without any additional androgen deprivation.  Testosterone level remains at a castrate level.  3.  Hypertension: This will be monitored periodically on Zytiga.  Blood pressure remains adequate at this time.  4.    Bone directed therapy: He has tolerated Xgeva without any issues at this time.  Long-term complications including osteonecrosis of the jaw and hypocalcemia were reiterated.  Delton See will be held for the time being.   5.  Prognosis and goals of care: Therapy remains palliative at this time although aggressive measures are warranted given his reasonable performance status.  6.  Hypocalcemia: Related to Xgeva.  His calcium level currently around 8.7 recommended continued calcium supplements.  Delton See will be held today.  7.  Hypokalemia: Potassium remains within normal range at this time without any need for any additional supplements.  8.  Follow-up: In 2 months for repeat evaluation.  30 minutes was spent on this encounter.  Time was dedicated to reviewing laboratory data, discussing disease status update as well as reviewing future plan of care and additional therapy that might be needed in the future.   Zola Button, MD 1/14/20211:08 PM

## 2019-03-29 NOTE — Telephone Encounter (Signed)
Scheduled appt per 1/14 los.  Sent a message to HIM pool to get a calendar mailed out. 

## 2019-03-30 ENCOUNTER — Telehealth: Payer: Self-pay | Admitting: Oncology

## 2019-03-30 NOTE — Telephone Encounter (Signed)
Scheduled appt per 1/15 sch message request - pt aware of appt change

## 2019-04-05 ENCOUNTER — Ambulatory Visit: Payer: Medicare Other | Attending: Internal Medicine

## 2019-04-05 DIAGNOSIS — Z23 Encounter for immunization: Secondary | ICD-10-CM | POA: Insufficient documentation

## 2019-04-05 NOTE — Progress Notes (Signed)
   Covid-19 Vaccination Clinic  Name:  Ethan [name]    MRN: [id number] DOB: 01-03-1951  04/05/2019  [name] was observed post Covid-19 immunization for 15 minutes without incidence. He was provided with Vaccine Information Sheet and instruction to access the V-Safe system.   [name] was instructed to call 911 with any severe reactions post vaccine: Marland Kitchen Difficulty breathing  . Swelling of your face and throat  . A fast heartbeat  . A bad rash all over your body  . Dizziness and weakness    Immunizations Administered    Name Date Dose VIS Date Route   Pfizer COVID-19 Vaccine 04/05/2019  8:51 AM 0.3 mL 02/23/2019 Intramuscular   Manufacturer: [place]   Lot: [id number]   [place]: [id number]

## 2019-04-12 ENCOUNTER — Other Ambulatory Visit: Payer: Self-pay | Admitting: Oncology

## 2019-04-12 DIAGNOSIS — C61 Malignant neoplasm of prostate: Secondary | ICD-10-CM

## 2019-04-14 ENCOUNTER — Other Ambulatory Visit: Payer: Self-pay | Admitting: Oncology

## 2019-04-17 MED FILL — ZYTIGA 500 MG TAB: 500 | 30 days supply | Qty: 60 | Fill #0

## 2019-04-23 ENCOUNTER — Ambulatory Visit: Payer: Medicare Other

## 2019-04-24 ENCOUNTER — Ambulatory Visit: Payer: Medicare Other | Attending: Internal Medicine

## 2019-04-24 DIAGNOSIS — Z23 Encounter for immunization: Secondary | ICD-10-CM | POA: Insufficient documentation

## 2019-04-24 NOTE — Progress Notes (Signed)
   Covid-19 Vaccination Clinic  Name:  Ethan [name]    MRN: [id number] DOB: 08-Feb-1951  04/24/2019  Mr. Buchmann was observed post Covid-19 immunization for 15 minutes without incidence. He was provided with Vaccine Information Sheet and instruction to access the V-Safe system.   Mr. Plucker was instructed to call 911 with any severe reactions post vaccine: Marland Kitchen Difficulty breathing  . Swelling of your face and throat  . A fast heartbeat  . A bad rash all over your body  . Dizziness and weakness    Immunizations Administered    Name Date Dose VIS Date Route   Pfizer COVID-19 Vaccine 04/24/2019 12:35 PM 0.3 mL 02/23/2019 Intramuscular   Manufacturer: [place]   Lot: [id number]   [place]: [id number]

## 2019-04-29 ENCOUNTER — Other Ambulatory Visit: Payer: Self-pay | Admitting: Oncology

## 2019-05-08 ENCOUNTER — Other Ambulatory Visit: Payer: Self-pay | Admitting: Oncology

## 2019-05-11 ENCOUNTER — Other Ambulatory Visit: Payer: Self-pay | Admitting: Oncology

## 2019-05-11 ENCOUNTER — Ambulatory Visit: Payer: Medicare Other

## 2019-05-11 DIAGNOSIS — C61 Malignant neoplasm of prostate: Secondary | ICD-10-CM

## 2019-05-17 MED FILL — ZYTIGA 500 MG TAB: 500 | 30 days supply | Qty: 60 | Fill #0

## 2019-05-25 ENCOUNTER — Other Ambulatory Visit: Payer: Self-pay

## 2019-05-25 DIAGNOSIS — C61 Malignant neoplasm of prostate: Secondary | ICD-10-CM

## 2019-05-28 ENCOUNTER — Other Ambulatory Visit: Payer: Self-pay

## 2019-05-28 ENCOUNTER — Inpatient Hospital Stay: Payer: Medicare Other | Attending: Oncology

## 2019-05-28 DIAGNOSIS — C61 Malignant neoplasm of prostate: Secondary | ICD-10-CM | POA: Insufficient documentation

## 2019-05-28 DIAGNOSIS — C7951 Secondary malignant neoplasm of bone: Secondary | ICD-10-CM | POA: Insufficient documentation

## 2019-05-28 DIAGNOSIS — I10 Essential (primary) hypertension: Secondary | ICD-10-CM | POA: Insufficient documentation

## 2019-05-28 DIAGNOSIS — Z9079 Acquired absence of other genital organ(s): Secondary | ICD-10-CM | POA: Diagnosis not present

## 2019-05-28 DIAGNOSIS — Z79899 Other long term (current) drug therapy: Secondary | ICD-10-CM | POA: Diagnosis not present

## 2019-05-28 DIAGNOSIS — E876 Hypokalemia: Secondary | ICD-10-CM | POA: Insufficient documentation

## 2019-05-28 LAB — CBC WITH DIFFERENTIAL (CANCER CENTER ONLY)
Abs Immature Granulocytes: 0.02 10*3/uL (ref 0.00–0.07)
Basophils Absolute: 0 10*3/uL (ref 0.0–0.1)
Basophils Relative: 1 %
Eosinophils Absolute: 0.3 10*3/uL (ref 0.0–0.5)
Eosinophils Relative: 4 %
HCT: 40.8 % (ref 39.0–52.0)
Hemoglobin: 14 g/dL (ref 13.0–17.0)
Immature Granulocytes: 0 %
Lymphocytes Relative: 34 %
Lymphs Abs: 2.1 10*3/uL (ref 0.7–4.0)
MCH: 32.3 pg (ref 26.0–34.0)
MCHC: 34.3 g/dL (ref 30.0–36.0)
MCV: 94 fL (ref 80.0–100.0)
Monocytes Absolute: 0.6 10*3/uL (ref 0.1–1.0)
Monocytes Relative: 9 %
Neutro Abs: 3.2 10*3/uL (ref 1.7–7.7)
Neutrophils Relative %: 52 %
Platelet Count: 255 10*3/uL (ref 150–400)
RBC: 4.34 MIL/uL (ref 4.22–5.81)
RDW: 11.6 % (ref 11.5–15.5)
WBC Count: 6.1 10*3/uL (ref 4.0–10.5)
nRBC: 0 % (ref 0.0–0.2)

## 2019-05-28 LAB — CMP (CANCER CENTER ONLY)
ALT: 19 U/L (ref 0–44)
AST: 23 U/L (ref 15–41)
Albumin: 4.4 g/dL (ref 3.5–5.0)
Alkaline Phosphatase: 47 U/L (ref 38–126)
Anion gap: 8 (ref 5–15)
BUN: 17 mg/dL (ref 8–23)
CO2: 29 mmol/L (ref 22–32)
Calcium: 9.4 mg/dL (ref 8.9–10.3)
Chloride: 104 mmol/L (ref 98–111)
Creatinine: 0.87 mg/dL (ref 0.61–1.24)
GFR, Est AFR Am: >60 mL/min (ref >60)
GFR, Estimated: >60 mL/min (ref >60)
Glucose, Bld: 113 mg/dL — ABNORMAL HIGH (ref 70–99)
Potassium: 4.3 mmol/L (ref 3.5–5.1)
Sodium: 141 mmol/L (ref 135–145)
Total Bilirubin: 1.1 mg/dL (ref 0.3–1.2)
Total Protein: 6.8 g/dL (ref 6.5–8.1)

## 2019-05-29 ENCOUNTER — Other Ambulatory Visit: Payer: Medicare Other

## 2019-05-29 LAB — PROSTATE-SPECIFIC AG, SERUM (LABCORP): Prostate Specific Ag, Serum: 10.7 ng/mL — ABNORMAL HIGH (ref 0.0–4.0)

## 2019-05-31 ENCOUNTER — Inpatient Hospital Stay (HOSPITAL_BASED_OUTPATIENT_CLINIC_OR_DEPARTMENT_OTHER): Payer: Medicare Other | Admitting: Oncology

## 2019-05-31 ENCOUNTER — Encounter: Payer: Self-pay | Admitting: Oncology

## 2019-05-31 ENCOUNTER — Inpatient Hospital Stay: Payer: Medicare Other

## 2019-05-31 ENCOUNTER — Telehealth: Payer: Self-pay | Admitting: Oncology

## 2019-05-31 ENCOUNTER — Other Ambulatory Visit: Payer: Self-pay

## 2019-05-31 VITALS — BP 126/71 | HR 73 | Temp 97.8°F | Resp 18 | Ht 70.0 in | Wt 174.4 lb

## 2019-05-31 DIAGNOSIS — C61 Malignant neoplasm of prostate: Secondary | ICD-10-CM | POA: Diagnosis not present

## 2019-05-31 DIAGNOSIS — E876 Hypokalemia: Secondary | ICD-10-CM | POA: Diagnosis not present

## 2019-05-31 DIAGNOSIS — I10 Essential (primary) hypertension: Secondary | ICD-10-CM | POA: Diagnosis not present

## 2019-05-31 DIAGNOSIS — Z9079 Acquired absence of other genital organ(s): Secondary | ICD-10-CM | POA: Diagnosis not present

## 2019-05-31 DIAGNOSIS — C7951 Secondary malignant neoplasm of bone: Secondary | ICD-10-CM | POA: Diagnosis not present

## 2019-05-31 DIAGNOSIS — D649 Anemia, unspecified: Secondary | ICD-10-CM

## 2019-05-31 MED ORDER — ABIRATERONE ACETATE 500 MG PO TABS: ORAL_TABLET | ORAL | 0 refills | Status: DC

## 2019-05-31 MED ORDER — DENOSUMAB 120 MG/1.7ML ~~LOC~~ SOLN
SUBCUTANEOUS | Status: AC
  Filled 2019-05-31: qty 1.7

## 2019-05-31 MED ORDER — DENOSUMAB 120 MG/1.7ML ~~LOC~~ SOLN
120.0000 mg | Freq: Once | SUBCUTANEOUS | Status: AC
  Administered 2019-05-31: 120 mg via SUBCUTANEOUS

## 2019-05-31 NOTE — Progress Notes (Signed)
Hematology and Oncology Follow Up Visit  Ethan [name] [id number] May 28, 1950 69 y.o. 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  He was found to have Gleason score 4+5 = 9, PSA 542 and that time.  Prior Therapy:   He is status post orchiectomy completed by Dr. Jeffie [name] on September 08, 2017.  Current therapy:  Zytiga 1000 mg daily with prednisone started in July 2019.  Xgeva on 120 mg every 8 weeks.  Interim History: Ethan [name] is here for a follow-up.  Since the last visit, he reports no major changes in his health.  He continues to tolerate Zytiga without any new complaints.  Denies any nausea, vomiting or abdominal pain.  Denies excessive fatigue tiredness.  Denies any bone pain or pathological fractures.                     Medications: Reviewed without any changes. Current Outpatient Medications  Medication Sig Dispense Refill  . Calcium Carb-Cholecalciferol (CALTRATE 600+D3 PO) Take 2 tablets by mouth daily.    . hydrochlorothiazide (MICROZIDE) 12.5 MG capsule TAKE 1 CAPSULE BY MOUTH EVERY DAY 90 capsule 1  . KLOR-CON M20 20 MEQ tablet TAKE 1 TABLET BY MOUTH EVERY DAY 30 tablet 0  . Multiple Vitamin (MULTIVITAMIN) tablet Take 1 tablet by mouth daily.    . predniSONE (DELTASONE) 5 MG tablet TAKE 1 TABLET BY MOUTH EVERY DAY WITH BREAKFAST 90 tablet 3  . tamsulosin (FLOMAX) 0.4 MG CAPS capsule TAKE 1 CAPSULE (0.4 MG TOTAL) BY MOUTH DAILY AFTER SUPPER. 90 capsule 1  . valsartan (DIOVAN) 80 MG tablet Take 80 mg by mouth daily.    Marland Kitchen zolpidem (AMBIEN) 10 MG tablet Take 5 mg by mouth at bedtime as needed for sleep.     Marland Kitchen ZYTIGA 500 MG tablet TAKE 2 TABLETS (1,000 MG TOTAL) BY MOUTH DAILY. TAKE ON AN EMPTY STOMACH 1 HOUR BEFORE OR 2 HOURS AFTER A MEAL. 60 tablet 0   No current facility-administered medications for this visit.      Allergies: No Known Allergies      Physical Exam:     Blood pressure 126/71, pulse 73, temperature 97.8 F (36.6 C), temperature source Temporal, resp. rate 18, height 5\' 10"  (1.778 m), weight 174 lb 6.4 oz (79.1 kg), SpO2 100 %.      ECOG: 0    General appearance: Comfortable appearing without any discomfort Head: Normocephalic without any trauma Oropharynx: Mucous membranes are moist and pink without any thrush or ulcers. Eyes: Pupils are equal and round reactive to light. Lymph nodes: No cervical, supraclavicular, inguinal or axillary lymphadenopathy.   Heart:regular rate and rhythm.  S1 and S2 without leg edema. Lung: Clear without any rhonchi or wheezes.  No dullness to percussion. Abdomin: Soft, nontender, nondistended with good bowel sounds.  No hepatosplenomegaly. Musculoskeletal: No joint deformity or effusion.  Full range of motion noted. Neurological: No deficits noted on motor, sensory and deep tendon reflex exam. Skin: No petechial rash or dryness.  Appeared moist.   .             Lab Results: Lab Results  Component Value Date   WBC 6.1 05/28/2019   HGB 14.0 05/28/2019   HCT 40.8 05/28/2019   MCV 94.0 05/28/2019   PLT 255 05/28/2019     Chemistry      Component Value Date/Time  NA 141 05/28/2019 0750   K 4.3 05/28/2019 0750   CL 104 05/28/2019 0750   CO2 29 05/28/2019 0750   BUN 17 05/28/2019 0750   CREATININE 0.87 05/28/2019 0750      Component Value Date/Time   CALCIUM 9.4 05/28/2019 0750   ALKPHOS 47 05/28/2019 0750   AST 23 05/28/2019 0750   ALT 19 05/28/2019 0750   BILITOT 1.1 05/28/2019 0750         Results for Ethan [name], Ethan "Ethan [name]" (MRN [id number]) as of 05/31/2019 12:49  Ref. Range 01/26/2019 07:58 03/26/2019 08:02 05/28/2019 07:50  Prostate Specific Ag, Serum Latest Ref Range: 0.0 - 4.0 ng/mL 11.6 (H) 10.6 (H) 10.7 (H)          Impression and Plan:   69 year old man with:  1.    Advanced  prostate cancer with disease to the bone diagnosed in 2019.  He has castration-sensitive disease for the time being.      He has tolerated Zytiga without any major complications.  His PSA remains overall stable in the last 6 months without any dramatic increase or signs of progression of disease.  The natural course of this disease was reviewed the risk of progression to castration hyper resistant disease treatment options at the time were discussed.  These options would include Xofigo as well as systemic chemotherapy were reiterated.  For the time being is agreeable to continue with Zytiga and we will repeat staging work-up before the next visit including CT scan and bone scan.   2.  Androgen deprivation: He does not require any additional androgen deprivation.  He is status post orchiectomy.  3.  Hypertension: His blood pressure is back within normal range.  We will continue to monitor on Zytiga.  4.    Bone directed therapy: Long-term complication associated with Delton See were reviewed today.  Hypocalcemia and osteonecrosis of the jaw were reiterated his calcium is back to normal and will receive Xgeva today.   5.  Prognosis and goals of care: His disease is incurable although aggressive measures are warranted.  Performance status is excellent and I recommended continuing with the current approach.   6.  Hypocalcemia: Improved at this time with calcium supplements and holding Xgeva.  His calcium of 9.4  7.  Hypokalemia: No issues reported on Zytiga and will be monitoring electrolytes on Zytiga.  8.  Follow-up: He will return in 8 weeks for a follow-up.  30 minutes were dedicated to this visit.  The time was spent on reviewing laboratory data, discussing treatment options, complications noted therapy and outlining prognosis overall.   Zola Button, MD 3/18/202112:47 PM

## 2019-05-31 NOTE — Telephone Encounter (Signed)
Scheduled appt per 3/18 los.  Sent a message to HIM pool to get a calendar mailed out. 

## 2019-06-01 ENCOUNTER — Other Ambulatory Visit: Payer: Self-pay | Admitting: Oncology

## 2019-06-13 MED FILL — ZYTIGA 500 MG TAB: 500 | 30 days supply | Qty: 60 | Fill #0

## 2019-06-26 ENCOUNTER — Other Ambulatory Visit: Payer: Self-pay | Admitting: Oncology

## 2019-07-10 ENCOUNTER — Other Ambulatory Visit: Payer: Self-pay | Admitting: Oncology

## 2019-07-10 DIAGNOSIS — C61 Malignant neoplasm of prostate: Secondary | ICD-10-CM

## 2019-07-12 MED FILL — ZYTIGA 500 MG TAB: 500 | 30 days supply | Qty: 60 | Fill #0

## 2019-07-24 ENCOUNTER — Encounter: Payer: Self-pay | Admitting: Oncology

## 2019-07-27 ENCOUNTER — Other Ambulatory Visit: Payer: Self-pay

## 2019-07-27 ENCOUNTER — Inpatient Hospital Stay: Payer: Medicare Other | Attending: Oncology

## 2019-07-27 DIAGNOSIS — Z9079 Acquired absence of other genital organ(s): Secondary | ICD-10-CM | POA: Diagnosis not present

## 2019-07-27 DIAGNOSIS — Z7952 Long term (current) use of systemic steroids: Secondary | ICD-10-CM | POA: Insufficient documentation

## 2019-07-27 DIAGNOSIS — E876 Hypokalemia: Secondary | ICD-10-CM | POA: Diagnosis not present

## 2019-07-27 DIAGNOSIS — I10 Essential (primary) hypertension: Secondary | ICD-10-CM | POA: Diagnosis not present

## 2019-07-27 DIAGNOSIS — Z79899 Other long term (current) drug therapy: Secondary | ICD-10-CM | POA: Insufficient documentation

## 2019-07-27 DIAGNOSIS — C7951 Secondary malignant neoplasm of bone: Secondary | ICD-10-CM | POA: Insufficient documentation

## 2019-07-27 DIAGNOSIS — C61 Malignant neoplasm of prostate: Secondary | ICD-10-CM | POA: Diagnosis not present

## 2019-07-27 LAB — CBC WITH DIFFERENTIAL (CANCER CENTER ONLY)
Abs Immature Granulocytes: 0.02 10*3/uL (ref 0.00–0.07)
Basophils Absolute: 0 10*3/uL (ref 0.0–0.1)
Basophils Relative: 0 %
Eosinophils Absolute: 0.2 10*3/uL (ref 0.0–0.5)
Eosinophils Relative: 2 %
HCT: 40 % (ref 39.0–52.0)
Hemoglobin: 13.6 g/dL (ref 13.0–17.0)
Immature Granulocytes: 0 %
Lymphocytes Relative: 22 %
Lymphs Abs: 1.4 10*3/uL (ref 0.7–4.0)
MCH: 32.1 pg (ref 26.0–34.0)
MCHC: 34 g/dL (ref 30.0–36.0)
MCV: 94.3 fL (ref 80.0–100.0)
Monocytes Absolute: 0.5 10*3/uL (ref 0.1–1.0)
Monocytes Relative: 7 %
Neutro Abs: 4.6 10*3/uL (ref 1.7–7.7)
Neutrophils Relative %: 69 %
Platelet Count: 264 10*3/uL (ref 150–400)
RBC: 4.24 MIL/uL (ref 4.22–5.81)
RDW: 11.8 % (ref 11.5–15.5)
WBC Count: 6.7 10*3/uL (ref 4.0–10.5)
nRBC: 0 % (ref 0.0–0.2)

## 2019-07-27 LAB — CMP (CANCER CENTER ONLY)
ALT: 16 U/L (ref 0–44)
AST: 19 U/L (ref 15–41)
Albumin: 4.3 g/dL (ref 3.5–5.0)
Alkaline Phosphatase: 49 U/L (ref 38–126)
Anion gap: 11 (ref 5–15)
BUN: 19 mg/dL (ref 8–23)
CO2: 26 mmol/L (ref 22–32)
Calcium: 9.2 mg/dL (ref 8.9–10.3)
Chloride: 105 mmol/L (ref 98–111)
Creatinine: 0.84 mg/dL (ref 0.61–1.24)
GFR, Est AFR Am: >60 mL/min (ref >60)
GFR, Estimated: >60 mL/min (ref >60)
Glucose, Bld: 132 mg/dL — ABNORMAL HIGH (ref 70–99)
Potassium: 4.1 mmol/L (ref 3.5–5.1)
Sodium: 142 mmol/L (ref 135–145)
Total Bilirubin: 0.7 mg/dL (ref 0.3–1.2)
Total Protein: 6.8 g/dL (ref 6.5–8.1)

## 2019-07-28 LAB — PROSTATE-SPECIFIC AG, SERUM (LABCORP): Prostate Specific Ag, Serum: 11.3 ng/mL — ABNORMAL HIGH (ref 0.0–4.0)

## 2019-07-31 ENCOUNTER — Other Ambulatory Visit: Payer: Medicare Other

## 2019-08-01 ENCOUNTER — Other Ambulatory Visit: Payer: Self-pay

## 2019-08-01 ENCOUNTER — Encounter (HOSPITAL_COMMUNITY): Payer: Self-pay

## 2019-08-01 ENCOUNTER — Ambulatory Visit (HOSPITAL_COMMUNITY)
Admission: RE | Admit: 2019-08-01 | Discharge: 2019-08-01 | Disposition: A | Discharge status: Home / Self Care | Payer: Medicare Other | Source: Ambulatory Visit | Attending: Oncology | Admitting: Oncology

## 2019-08-01 ENCOUNTER — Encounter (HOSPITAL_COMMUNITY)
Admission: RE | Admit: 2019-08-01 | Discharge: 2019-08-01 | Disposition: A | Discharge status: Home / Self Care | Payer: Medicare Other | Source: Ambulatory Visit | Attending: Oncology | Admitting: Oncology

## 2019-08-01 DIAGNOSIS — C7951 Secondary malignant neoplasm of bone: Secondary | ICD-10-CM | POA: Diagnosis not present

## 2019-08-01 DIAGNOSIS — C779 Secondary and unspecified malignant neoplasm of lymph node, unspecified: Secondary | ICD-10-CM | POA: Diagnosis not present

## 2019-08-01 DIAGNOSIS — C61 Malignant neoplasm of prostate: Secondary | ICD-10-CM | POA: Diagnosis not present

## 2019-08-01 MED ORDER — TECHNETIUM TC 99M MEDRONATE IV KIT
22.0000 | PACK | Freq: Once | INTRAVENOUS | Status: AC
  Administered 2019-08-01: 22 via INTRAVENOUS

## 2019-08-01 MED ORDER — SODIUM CHLORIDE (PF) 0.9 % IJ SOLN
INTRAMUSCULAR | Status: AC
  Filled 2019-08-01: qty 50

## 2019-08-01 MED ORDER — IOHEXOL 300 MG/ML  SOLN
100.0000 mL | Freq: Once | INTRAMUSCULAR | Status: AC | PRN
  Administered 2019-08-01: 100 mL via INTRAVENOUS

## 2019-08-02 ENCOUNTER — Inpatient Hospital Stay: Payer: Medicare Other

## 2019-08-02 ENCOUNTER — Encounter: Payer: Self-pay | Admitting: Oncology

## 2019-08-02 ENCOUNTER — Inpatient Hospital Stay (HOSPITAL_BASED_OUTPATIENT_CLINIC_OR_DEPARTMENT_OTHER): Payer: Medicare Other | Admitting: Oncology

## 2019-08-02 ENCOUNTER — Other Ambulatory Visit: Payer: Self-pay

## 2019-08-02 VITALS — BP 128/64 | HR 75 | Temp 97.9°F | Resp 16 | Ht 70.0 in | Wt 175.2 lb

## 2019-08-02 DIAGNOSIS — C61 Malignant neoplasm of prostate: Secondary | ICD-10-CM | POA: Diagnosis not present

## 2019-08-02 DIAGNOSIS — I10 Essential (primary) hypertension: Secondary | ICD-10-CM | POA: Diagnosis not present

## 2019-08-02 DIAGNOSIS — Z7952 Long term (current) use of systemic steroids: Secondary | ICD-10-CM | POA: Diagnosis not present

## 2019-08-02 DIAGNOSIS — E876 Hypokalemia: Secondary | ICD-10-CM | POA: Diagnosis not present

## 2019-08-02 DIAGNOSIS — C7951 Secondary malignant neoplasm of bone: Secondary | ICD-10-CM | POA: Diagnosis not present

## 2019-08-02 MED ORDER — DENOSUMAB 120 MG/1.7ML ~~LOC~~ SOLN
120.0000 mg | Freq: Once | SUBCUTANEOUS | Status: AC
  Administered 2019-08-02: 120 mg via SUBCUTANEOUS

## 2019-08-02 MED ORDER — DENOSUMAB 120 MG/1.7ML ~~LOC~~ SOLN
SUBCUTANEOUS | Status: AC
  Filled 2019-08-02: qty 1.7

## 2019-08-02 NOTE — Patient Instructions (Signed)
Denosumab injection What is this medicine? DENOSUMAB (den oh sue mab) slows bone breakdown. Prolia is used to treat osteoporosis in women after menopause and in men, and in people who are taking corticosteroids for 6 months or more. Xgeva is used to treat a high calcium level due to cancer and to prevent bone fractures and other bone problems caused by multiple myeloma or cancer bone metastases. Xgeva is also used to treat giant cell tumor of the bone. This medicine may be used for other purposes; ask your health care provider or pharmacist if you have questions. COMMON BRAND NAME(S): Prolia, XGEVA What should I tell my health care provider before I take this medicine? They need to know if you have any of these conditions:  dental disease  having surgery or tooth extraction  infection  kidney disease  low levels of calcium or Vitamin D in the blood  malnutrition  on hemodialysis  skin conditions or sensitivity  thyroid or parathyroid disease  an unusual reaction to denosumab, other medicines, foods, dyes, or preservatives  pregnant or trying to get pregnant  breast-feeding How should I use this medicine? This medicine is for injection under the skin. It is given by a health care professional in a hospital or clinic setting. A special MedGuide will be given to you before each treatment. Be sure to read this information carefully each time. For Prolia, talk to your pediatrician regarding the use of this medicine in children. Special care may be needed. For Xgeva, talk to your pediatrician regarding the use of this medicine in children. While this drug may be prescribed for children as young as 13 years for selected conditions, precautions do apply. Overdosage: If you think you have taken too much of this medicine contact a poison control center or emergency room at once. NOTE: This medicine is only for you. Do not share this medicine with others. What if I miss a dose? It is  important not to miss your dose. Call your doctor or health care professional if you are unable to keep an appointment. What may interact with this medicine? Do not take this medicine with any of the following medications:  other medicines containing denosumab This medicine may also interact with the following medications:  medicines that lower your chance of fighting infection  steroid medicines like prednisone or cortisone This list may not describe all possible interactions. Give your health care provider a list of all the medicines, herbs, non-prescription drugs, or dietary supplements you use. Also tell them if you smoke, drink alcohol, or use illegal drugs. Some items may interact with your medicine. What should I watch for while using this medicine? Visit your doctor or health care professional for regular checks on your progress. Your doctor or health care professional may order blood tests and other tests to see how you are doing. Call your doctor or health care professional for advice if you get a fever, chills or sore throat, or other symptoms of a cold or flu. Do not treat yourself. This drug may decrease your body's ability to fight infection. Try to avoid being around people who are sick. You should make sure you get enough calcium and vitamin D while you are taking this medicine, unless your doctor tells you not to. Discuss the foods you eat and the vitamins you take with your health care professional. See your dentist regularly. Brush and floss your teeth as directed. Before you have any dental work done, tell your dentist you are   receiving this medicine. Do not become pregnant while taking this medicine or for 5 months after stopping it. Talk with your doctor or health care professional about your birth control options while taking this medicine. Women should inform their doctor if they wish to become pregnant or think they might be pregnant. There is a potential for serious side  effects to an unborn child. Talk to your health care professional or pharmacist for more information. What side effects may I notice from receiving this medicine? Side effects that you should report to your doctor or health care professional as soon as possible:  allergic reactions like skin rash, itching or hives, swelling of the face, lips, or tongue  bone pain  breathing problems  dizziness  jaw pain, especially after dental work  redness, blistering, peeling of the skin  signs and symptoms of infection like fever or chills; cough; sore throat; pain or trouble passing urine  signs of low calcium like fast heartbeat, muscle cramps or muscle pain; pain, tingling, numbness in the hands or feet; seizures  unusual bleeding or bruising  unusually weak or tired Side effects that usually do not require medical attention (report to your doctor or health care professional if they continue or are bothersome):  constipation  diarrhea  headache  joint pain  loss of appetite  muscle pain  runny nose  tiredness  upset stomach This list may not describe all possible side effects. Call your doctor for medical advice about side effects. You may report side effects to FDA at 1-800-FDA-1088. Where should I keep my medicine? This medicine is only given in a clinic, doctor's office, or other health care setting and will not be stored at home. NOTE: This sheet is a summary. It may not cover all possible information. If you have questions about this medicine, talk to your doctor, pharmacist, or health care provider.  2020 Elsevier/Gold Standard (2017-07-08 16:10:44)

## 2019-08-02 NOTE — Progress Notes (Signed)
Hematology and Oncology Follow Up Visit  Ethan [name] [id number] 1950-08-08 69 y.o. 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  He was found to have castration-sensitive, Gleason score 4+5 = 9, PSA 542.   Prior Therapy:   He is status post orchiectomy completed by Dr. Jeffie Pollock on September 08, 2017.  Current therapy:  Zytiga 1000 mg daily with prednisone started in July 2019.  Xgeva on 120 mg every 8 weeks.  Interim History: Ethan [name]. Ethan [name] is here for return evaluation.  Since last visit, he reports no major changes in his health.  He continues to be active and exercise regularly.  Continues to walk daily and lifts light weights periodically.  He denies any complications related to Zytiga.  Denies any nausea fatigue or lower extremity edema.   Medications: Unchanged on review. Current Outpatient Medications  Medication Sig Dispense Refill  . Calcium Carb-Cholecalciferol (CALTRATE 600+D3 PO) Take 2 tablets by mouth daily.    . hydrochlorothiazide (MICROZIDE) 12.5 MG capsule TAKE 1 CAPSULE BY MOUTH EVERY DAY 90 capsule 1  . KLOR-CON M20 20 MEQ tablet TAKE 1 TABLET BY MOUTH EVERY DAY 30 tablet 0  . Multiple Vitamin (MULTIVITAMIN) tablet Take 1 tablet by mouth daily.    . predniSONE (DELTASONE) 5 MG tablet TAKE 1 TABLET BY MOUTH EVERY DAY WITH BREAKFAST 90 tablet 3  . tamsulosin (FLOMAX) 0.4 MG CAPS capsule TAKE 1 CAPSULE (0.4 MG TOTAL) BY MOUTH DAILY AFTER SUPPER. 90 capsule 1  . valsartan (DIOVAN) 80 MG tablet Take 80 mg by mouth daily.    Marland Kitchen zolpidem (AMBIEN) 10 MG tablet Take 5 mg by mouth at bedtime as needed for sleep.     Marland Kitchen ZYTIGA 500 MG tablet TAKE 2 TABLETS (1,000 MG TOTAL) BY MOUTH DAILY. TAKE ON AN EMPTY STOMACH 1 HOUR BEFORE OR 2 HOURS AFTER A MEAL. 60 tablet 0   No current facility-administered medications for this visit.      Allergies: No Known Allergies      Physical Exam:     Blood pressure 128/64, pulse 75, temperature 97.9 F (36.6 C), temperature source Temporal, resp. rate 16, height 5\' 10"  (1.778 m), weight 175 lb 3.2 oz (79.5 kg), SpO2 100 %.      ECOG: 0    General appearance: Alert, awake without any distress. Head: Atraumatic without abnormalities Oropharynx: Without any thrush or ulcers. Eyes: No scleral icterus. Lymph nodes: No lymphadenopathy noted in the cervical, supraclavicular, or axillary nodes Heart:regular rate and rhythm, without any murmurs or gallops.   Lung: Clear to auscultation without any rhonchi, wheezes or dullness to percussion. Abdomin: Soft, nontender without any shifting dullness or ascites. Musculoskeletal: No clubbing or cyanosis. Neurological: No motor or sensory deficits. Skin: No rashes or lesions.   .             Lab Results: Lab Results  Component Value Date   WBC 6.7 07/27/2019   HGB 13.6 07/27/2019   HCT 40.0 07/27/2019   MCV 94.3 07/27/2019   PLT 264 07/27/2019     Chemistry      Component Value Date/Time   NA 142 07/27/2019 0759   K 4.1 07/27/2019 0759   CL 105 07/27/2019 0759   CO2 26 07/27/2019 0759   BUN 19 07/27/2019 0759   CREATININE 0.84 07/27/2019 0759      Component Value Date/Time  CALCIUM 9.2 07/27/2019 0759   ALKPHOS 49 07/27/2019 0759   AST 19 07/27/2019 0759   ALT 16 07/27/2019 0759   BILITOT 0.7 07/27/2019 0759          Results for Ethan [name], [name] (MRN [id number]) as of 08/02/2019 10:14  Ref. Range 03/26/2019 08:02 05/28/2019 07:50 07/27/2019 07:59  Prostate Specific Ag, Serum Latest Ref Range: 0.0 - 4.0 ng/mL 10.6 (H) 10.7 (H) 11.3 (H)          Impression and Plan:   69 year old man with:  1.    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        He is currently on Zytiga with PSA remains under reasonable control  and overall stable.  CT scan and bone scan obtained on Aug 01, 2019 were personally reviewed and discussed with the patient.  The imaging study showed overall stable disease and positive response to therapy.  The natural course of this disease and treatment options were reviewed.  Alternative options such as systemic chemotherapy will be deferred if he developed castration-resistant disease.  I have recommended continuing with Zytiga with the same dose and schedule.   2.  Androgen deprivation: He status post orchiectomy and no additional androgen deprivation is needed.  3.  Hypertension: No issues reported with blood pressure at this time.  We will continue to monitor on Zytiga.  4.    Bone directed therapy: He is currently on that Xgeva which I recommended continuing for the time being.  Complication bleeding hypocalcemia and osteonecrosis of the jaw were reviewed.  His calcium is adequate today and will receive it on time.   5.  Prognosis and goals of care: Therapy remains palliative although aggressive measures are warranted given his excellent performance status.  6.  Hypocalcemia: Resolved at this time with calcium back to normal range I recommend continued calcium supplements.  7.  Hypokalemia: Potassium is within normal range at this time I recommended continued monitoring.  8.  Follow-up: In 2 months for repeat evaluation.  30 minutes were spent on this encounter.  Time was dedicated to reviewing his disease status, reviewing imaging studies and answering questions regarding future plan of care.   Zola Button, MD 5/20/202110:27 AM

## 2019-08-03 ENCOUNTER — Telehealth: Payer: Self-pay | Admitting: Oncology

## 2019-08-03 NOTE — Telephone Encounter (Signed)
Scheduled appt per 5/20 los.  Spoke with pt and they are aware of the appt date and time. 

## 2019-08-07 ENCOUNTER — Other Ambulatory Visit: Payer: Self-pay | Admitting: Oncology

## 2019-08-07 DIAGNOSIS — C61 Malignant neoplasm of prostate: Secondary | ICD-10-CM

## 2019-08-15 MED FILL — ZYTIGA 500 MG TAB: 500 | 30 days supply | Qty: 60 | Fill #0

## 2019-08-18 ENCOUNTER — Other Ambulatory Visit: Payer: Self-pay | Admitting: Oncology

## 2019-08-24 ENCOUNTER — Other Ambulatory Visit: Payer: Self-pay | Admitting: Oncology

## 2019-09-01 ENCOUNTER — Encounter: Payer: Self-pay | Admitting: Oncology

## 2019-09-03 ENCOUNTER — Telehealth: Payer: Self-pay | Admitting: Oncology

## 2019-09-03 NOTE — Telephone Encounter (Signed)
R/s appt per 6/21 sch message - unable to reach pt .left message with new appt date and time

## 2019-09-05 ENCOUNTER — Other Ambulatory Visit: Payer: Self-pay | Admitting: Oncology

## 2019-09-05 DIAGNOSIS — C61 Malignant neoplasm of prostate: Secondary | ICD-10-CM

## 2019-09-07 ENCOUNTER — Encounter: Payer: Self-pay | Admitting: Oncology

## 2019-09-07 ENCOUNTER — Other Ambulatory Visit: Payer: Self-pay

## 2019-09-07 MED ORDER — POTASSIUM CHLORIDE CRYS ER 20 MEQ PO TBCR: 20.0000 meq | EXTENDED_RELEASE_TABLET | Freq: Every day | ORAL | 3 refills | Status: DC

## 2019-09-11 MED FILL — ZYTIGA 500 MG TAB: 500 | 30 days supply | Qty: 60 | Fill #0

## 2019-09-28 ENCOUNTER — Encounter: Payer: Self-pay | Admitting: Oncology

## 2019-10-02 ENCOUNTER — Inpatient Hospital Stay: Payer: Medicare Other | Attending: Oncology

## 2019-10-02 ENCOUNTER — Other Ambulatory Visit: Payer: Self-pay

## 2019-10-02 DIAGNOSIS — C61 Malignant neoplasm of prostate: Secondary | ICD-10-CM | POA: Diagnosis not present

## 2019-10-02 DIAGNOSIS — Z79899 Other long term (current) drug therapy: Secondary | ICD-10-CM | POA: Diagnosis not present

## 2019-10-02 DIAGNOSIS — E876 Hypokalemia: Secondary | ICD-10-CM | POA: Diagnosis not present

## 2019-10-02 DIAGNOSIS — C7951 Secondary malignant neoplasm of bone: Secondary | ICD-10-CM | POA: Diagnosis not present

## 2019-10-02 DIAGNOSIS — I10 Essential (primary) hypertension: Secondary | ICD-10-CM | POA: Diagnosis not present

## 2019-10-02 LAB — CBC WITH DIFFERENTIAL (CANCER CENTER ONLY)
Abs Immature Granulocytes: 0.01 10*3/uL (ref 0.00–0.07)
Basophils Absolute: 0 10*3/uL (ref 0.0–0.1)
Basophils Relative: 1 %
Eosinophils Absolute: 0.2 10*3/uL (ref 0.0–0.5)
Eosinophils Relative: 3 %
HCT: 39.8 % (ref 39.0–52.0)
Hemoglobin: 13.8 g/dL (ref 13.0–17.0)
Immature Granulocytes: 0 %
Lymphocytes Relative: 25 %
Lymphs Abs: 1.4 10*3/uL (ref 0.7–4.0)
MCH: 32 pg (ref 26.0–34.0)
MCHC: 34.7 g/dL (ref 30.0–36.0)
MCV: 92.3 fL (ref 80.0–100.0)
Monocytes Absolute: 0.4 10*3/uL (ref 0.1–1.0)
Monocytes Relative: 7 %
Neutro Abs: 3.8 10*3/uL (ref 1.7–7.7)
Neutrophils Relative %: 64 %
Platelet Count: 257 10*3/uL (ref 150–400)
RBC: 4.31 MIL/uL (ref 4.22–5.81)
RDW: 11.6 % (ref 11.5–15.5)
WBC Count: 5.8 10*3/uL (ref 4.0–10.5)
nRBC: 0 % (ref 0.0–0.2)

## 2019-10-02 LAB — CMP (CANCER CENTER ONLY)
ALT: 19 U/L (ref 0–44)
AST: 21 U/L (ref 15–41)
Albumin: 4.3 g/dL (ref 3.5–5.0)
Alkaline Phosphatase: 47 U/L (ref 38–126)
Anion gap: 10 (ref 5–15)
BUN: 19 mg/dL (ref 8–23)
CO2: 26 mmol/L (ref 22–32)
Calcium: 9.4 mg/dL (ref 8.9–10.3)
Chloride: 104 mmol/L (ref 98–111)
Creatinine: 0.85 mg/dL (ref 0.61–1.24)
GFR, Est AFR Am: >60 mL/min (ref >60)
GFR, Estimated: >60 mL/min (ref >60)
Glucose, Bld: 127 mg/dL — ABNORMAL HIGH (ref 70–99)
Potassium: 3.9 mmol/L (ref 3.5–5.1)
Sodium: 140 mmol/L (ref 135–145)
Total Bilirubin: 1 mg/dL (ref 0.3–1.2)
Total Protein: 6.9 g/dL (ref 6.5–8.1)

## 2019-10-03 LAB — PROSTATE-SPECIFIC AG, SERUM (LABCORP): Prostate Specific Ag, Serum: 10.5 ng/mL — ABNORMAL HIGH (ref 0.0–4.0)

## 2019-10-04 ENCOUNTER — Ambulatory Visit: Payer: Medicare Other

## 2019-10-04 ENCOUNTER — Inpatient Hospital Stay (HOSPITAL_BASED_OUTPATIENT_CLINIC_OR_DEPARTMENT_OTHER): Payer: Medicare Other | Admitting: Oncology

## 2019-10-04 ENCOUNTER — Ambulatory Visit: Payer: Medicare Other | Admitting: Oncology

## 2019-10-04 ENCOUNTER — Other Ambulatory Visit: Payer: Self-pay

## 2019-10-04 ENCOUNTER — Encounter: Payer: Self-pay | Admitting: Oncology

## 2019-10-04 ENCOUNTER — Inpatient Hospital Stay: Payer: Medicare Other

## 2019-10-04 VITALS — BP 123/84 | HR 86 | Temp 97.5°F | Resp 18 | Wt 168.4 lb

## 2019-10-04 DIAGNOSIS — C61 Malignant neoplasm of prostate: Secondary | ICD-10-CM | POA: Diagnosis not present

## 2019-10-04 DIAGNOSIS — Z79899 Other long term (current) drug therapy: Secondary | ICD-10-CM | POA: Diagnosis not present

## 2019-10-04 DIAGNOSIS — E876 Hypokalemia: Secondary | ICD-10-CM | POA: Diagnosis not present

## 2019-10-04 DIAGNOSIS — C7951 Secondary malignant neoplasm of bone: Secondary | ICD-10-CM | POA: Diagnosis not present

## 2019-10-04 DIAGNOSIS — I10 Essential (primary) hypertension: Secondary | ICD-10-CM | POA: Diagnosis not present

## 2019-10-04 NOTE — Progress Notes (Signed)
Hematology and Oncology Follow Up Visit  Ethan [name] [id number] August 21, 1950 69 y.o. 10/04/2019 2:32 PM [name], [name], MDRankins, [name], MD   Principle Diagnosis: 69 year old man with prostate cancer diagnosed in 2019.  He was found to have castration-sensitive disease bone involvement and lymphadenopathy, PSA 542 and Gleason score of 9.  Prior Therapy:   He is status post orchiectomy completed by Dr. Jeffie Pollock on September 08, 2017.  Current therapy:  Zytiga 1000 mg daily with prednisone started in July 2019.  Xgeva on 120 mg every 8 weeks.  Interim History: [name] returns today for a repeat evaluation.  Since last visit, he reports no major changes in his health. He continues to be active and exercises regularly. He has lost weight intentionally and has gained muscle because of his workouts. His appetite has been excellent and has been eating well. He did have pain in his teeth and was evaluated by his dentist. He appears to have a crack in his root canal but no procedure is scheduled at this time. He denies any complications related to [facility] currently.  Medications: Reviewed without changes. Current Outpatient Medications  Medication Sig Dispense Refill  . Calcium Carb-Cholecalciferol (CALTRATE 600+D3 PO) Take 2 tablets by mouth daily.    . hydrochlorothiazide (MICROZIDE) 12.5 MG capsule TAKE 1 CAPSULE BY MOUTH EVERY DAY 90 capsule 1  . Multiple Vitamin (MULTIVITAMIN) tablet Take 1 tablet by mouth daily.    . potassium chloride SA (KLOR-CON M20) 20 MEQ tablet Take 1 tablet (20 mEq total) by mouth daily. 30 tablet 3  . predniSONE (DELTASONE) 5 MG tablet TAKE 1 TABLET BY MOUTH EVERY DAY WITH BREAKFAST 90 tablet 3  . tamsulosin (FLOMAX) 0.4 MG CAPS capsule TAKE 1 CAPSULE (0.4 MG TOTAL) BY MOUTH DAILY AFTER SUPPER. 90 capsule 1  . valsartan (DIOVAN) 80 MG tablet Take 80 mg by mouth daily.    Marland Kitchen zolpidem (AMBIEN) 10 MG tablet Take 5 mg by mouth at bedtime as needed for sleep.     Marland Kitchen  ZYTIGA 500 MG tablet TAKE 2 TABLETS (1,000 MG TOTAL) BY MOUTH DAILY. TAKE ON AN EMPTY STOMACH 1 HOUR BEFORE OR 2 HOURS AFTER A MEAL. 60 tablet 0   No current facility-administered medications for this visit.     Allergies: No Known Allergies      Physical Exam:      Blood pressure 123/84, pulse 86, temperature (!) 97.5 F (36.4 C), temperature source Temporal, resp. rate 18, weight 168 lb 6.4 oz (76.4 kg), SpO2 98 %.      ECOG: 0    General appearance: Comfortable appearing without any discomfort Head: Normocephalic without any trauma Oropharynx: Mucous membranes are moist and pink without any thrush or ulcers. Eyes: Pupils are equal and round reactive to light. Lymph nodes: No cervical, supraclavicular, inguinal or axillary lymphadenopathy.   Heart:regular rate and rhythm.  S1 and S2 without leg edema. Lung: Clear without any rhonchi or wheezes.  No dullness to percussion. Abdomin: Soft, nontender, nondistended with good bowel sounds.  No hepatosplenomegaly. Musculoskeletal: No joint deformity or effusion.  Full range of motion noted. Neurological: No deficits noted on motor, sensory and deep tendon reflex exam. Skin: No petechial rash or dryness.  Appeared moist.     .             Lab Results: Lab Results  Component Value Date   WBC 5.8 10/02/2019   HGB 13.8 10/02/2019   HCT 39.8 10/02/2019   MCV 92.3 10/02/2019  PLT 257 10/02/2019     Chemistry      Component Value Date/Time   NA 140 10/02/2019 0752   K 3.9 10/02/2019 0752   CL 104 10/02/2019 0752   CO2 26 10/02/2019 0752   BUN 19 10/02/2019 0752   CREATININE 0.85 10/02/2019 0752      Component Value Date/Time   CALCIUM 9.4 10/02/2019 0752   ALKPHOS 47 10/02/2019 0752   AST 21 10/02/2019 0752   ALT 19 10/02/2019 0752   BILITOT 1.0 10/02/2019 0752         Results for KESTER, STIMPSON "[name]" (MRN [id number]) as of 10/04/2019 14:34  Ref. Range 07/27/2019 07:59 10/02/2019 07:52   Prostate Specific Ag, Serum Latest Ref Range: 0.0 - 4.0 ng/mL 11.3 (H) 10.5 (H)            Impression and Plan:   69 year old man with:  1.    Prostate cancer diagnosed in 2019.  He was found to have advanced castration-sensitive to the bone and lymphadenopathy.  He continues to be on Zytiga with excellent control of his disease with current PSA at 10.5.  The natural course of this disease was reviewed today and treatment options were discussed.  The role of systemic chemotherapy including Taxotere has been deferred unless he developed castration-resistant disease.  Imaging studies obtained in May 2021 showed overall stable bone disease and regression of his lymphadenopathy.  Long-term complication associated with Zytiga were also reviewed including hypertension, edema and hypokalemia were reiterated.  He is agreeable to continue.    2.  Androgen deprivation: No additional treatment needed at this time he status post orchiectomy.  3.  Hypertension: Blood pressure is within normal range at this time.  4.    Bone directed therapy: Long-term complications associated with Xgeva including hypocalcemia and osteonecrosis of the jaw were reviewed. We will hold his treatment for the time being given his potential dental procedure and dental pain.   5.  Prognosis and goals of care: His disease is incurable although aggressive measures are warranted given his excellent performance status.  6.  Hypocalcemia: His calcium remains adequate and will receive Xgeva today.  7.  Hypokalemia: Potassium continues to be within normal range and will monitor on eval.  8.  Follow-up: He will return in 2 months for repeat follow-up.  30 minutes were dedicated to this visit.  The time was spent on updating his disease status, discussing treatment options and reviewing future plan peer options.   Zola Button, MD 7/22/20212:32 PM

## 2019-10-08 ENCOUNTER — Other Ambulatory Visit: Payer: Self-pay | Admitting: Oncology

## 2019-10-08 ENCOUNTER — Telehealth: Payer: Self-pay | Admitting: Oncology

## 2019-10-08 DIAGNOSIS — C61 Malignant neoplasm of prostate: Secondary | ICD-10-CM

## 2019-10-08 NOTE — Telephone Encounter (Signed)
Scheduled per 07/22 los, patient has been called and notified. 

## 2019-10-15 MED FILL — ZYTIGA 500 MG TAB: 500 | 30 days supply | Qty: 60 | Fill #0

## 2019-10-23 ENCOUNTER — Other Ambulatory Visit: Payer: Self-pay | Admitting: Oncology

## 2019-10-30 ENCOUNTER — Telehealth: Payer: Self-pay

## 2019-10-30 ENCOUNTER — Encounter: Payer: Self-pay | Admitting: Oncology

## 2019-10-30 NOTE — Telephone Encounter (Signed)
TC to pt in regard to patients question inquiring if he was  eligible for the third vaccine injection? Let pt know that per Dr Alen Blew he is eligible for the booster when it is avaliavible. Patient verbalized understanding.

## 2019-11-09 ENCOUNTER — Other Ambulatory Visit: Payer: Self-pay | Admitting: Oncology

## 2019-11-09 DIAGNOSIS — C61 Malignant neoplasm of prostate: Secondary | ICD-10-CM

## 2019-11-09 DIAGNOSIS — Z23 Encounter for immunization: Secondary | ICD-10-CM | POA: Diagnosis not present

## 2019-11-12 MED FILL — ZYTIGA 500 MG TAB: 500 | 30 days supply | Qty: 60 | Fill #0

## 2019-11-27 ENCOUNTER — Other Ambulatory Visit: Payer: Medicare Other

## 2019-11-27 ENCOUNTER — Other Ambulatory Visit: Payer: Self-pay | Admitting: Oncology

## 2019-11-27 ENCOUNTER — Other Ambulatory Visit: Payer: Self-pay | Admitting: Critical Care Medicine

## 2019-11-27 DIAGNOSIS — Z20822 Contact with and (suspected) exposure to covid-19: Secondary | ICD-10-CM | POA: Diagnosis not present

## 2019-11-29 LAB — NOVEL CORONAVIRUS, NAA: SARS-CoV-2, NAA: NOT DETECTED

## 2019-11-29 LAB — SARS-COV-2, NAA 2 DAY TAT

## 2019-12-03 DIAGNOSIS — G4700 Insomnia, unspecified: Secondary | ICD-10-CM | POA: Diagnosis not present

## 2019-12-03 DIAGNOSIS — I10 Essential (primary) hypertension: Secondary | ICD-10-CM | POA: Diagnosis not present

## 2019-12-03 DIAGNOSIS — C61 Malignant neoplasm of prostate: Secondary | ICD-10-CM | POA: Diagnosis not present

## 2019-12-05 ENCOUNTER — Other Ambulatory Visit: Payer: Self-pay | Admitting: Oncology

## 2019-12-05 DIAGNOSIS — C61 Malignant neoplasm of prostate: Secondary | ICD-10-CM

## 2019-12-11 ENCOUNTER — Other Ambulatory Visit: Payer: Self-pay

## 2019-12-11 ENCOUNTER — Inpatient Hospital Stay: Payer: Medicare Other | Attending: Oncology

## 2019-12-11 DIAGNOSIS — C7951 Secondary malignant neoplasm of bone: Secondary | ICD-10-CM | POA: Insufficient documentation

## 2019-12-11 DIAGNOSIS — C61 Malignant neoplasm of prostate: Secondary | ICD-10-CM | POA: Insufficient documentation

## 2019-12-11 LAB — CBC WITH DIFFERENTIAL (CANCER CENTER ONLY)
Abs Immature Granulocytes: 0.02 10*3/uL (ref 0.00–0.07)
Basophils Absolute: 0 10*3/uL (ref 0.0–0.1)
Basophils Relative: 0 %
Eosinophils Absolute: 0.4 10*3/uL (ref 0.0–0.5)
Eosinophils Relative: 5 %
HCT: 40 % (ref 39.0–52.0)
Hemoglobin: 13.6 g/dL (ref 13.0–17.0)
Immature Granulocytes: 0 %
Lymphocytes Relative: 18 %
Lymphs Abs: 1.4 10*3/uL (ref 0.7–4.0)
MCH: 31.2 pg (ref 26.0–34.0)
MCHC: 34 g/dL (ref 30.0–36.0)
MCV: 91.7 fL (ref 80.0–100.0)
Monocytes Absolute: 0.7 10*3/uL (ref 0.1–1.0)
Monocytes Relative: 9 %
Neutro Abs: 5.2 10*3/uL (ref 1.7–7.7)
Neutrophils Relative %: 68 %
Platelet Count: 258 10*3/uL (ref 150–400)
RBC: 4.36 MIL/uL (ref 4.22–5.81)
RDW: 11.8 % (ref 11.5–15.5)
WBC Count: 7.6 10*3/uL (ref 4.0–10.5)
nRBC: 0 % (ref 0.0–0.2)

## 2019-12-11 LAB — CMP (CANCER CENTER ONLY)
ALT: 16 U/L (ref 0–44)
AST: 20 U/L (ref 15–41)
Albumin: 4.3 g/dL (ref 3.5–5.0)
Alkaline Phosphatase: 55 U/L (ref 38–126)
Anion gap: 10 (ref 5–15)
BUN: 16 mg/dL (ref 8–23)
CO2: 27 mmol/L (ref 22–32)
Calcium: 9.4 mg/dL (ref 8.9–10.3)
Chloride: 103 mmol/L (ref 98–111)
Creatinine: 0.8 mg/dL (ref 0.61–1.24)
GFR, Est AFR Am: >60 mL/min (ref >60)
GFR, Estimated: >60 mL/min (ref >60)
Glucose, Bld: 114 mg/dL — ABNORMAL HIGH (ref 70–99)
Potassium: 4.3 mmol/L (ref 3.5–5.1)
Sodium: 140 mmol/L (ref 135–145)
Total Bilirubin: 1.2 mg/dL (ref 0.3–1.2)
Total Protein: 6.9 g/dL (ref 6.5–8.1)

## 2019-12-12 LAB — PROSTATE-SPECIFIC AG, SERUM (LABCORP): Prostate Specific Ag, Serum: 10.2 ng/mL — ABNORMAL HIGH (ref 0.0–4.0)

## 2019-12-12 MED FILL — ZYTIGA 500 MG TAB: 500 | 30 days supply | Qty: 60 | Fill #0

## 2019-12-14 ENCOUNTER — Inpatient Hospital Stay: Payer: Medicare Other

## 2019-12-14 ENCOUNTER — Other Ambulatory Visit: Payer: Self-pay

## 2019-12-14 ENCOUNTER — Inpatient Hospital Stay: Payer: Medicare Other | Attending: Oncology | Admitting: Oncology

## 2019-12-14 VITALS — BP 112/75 | HR 82 | Temp 97.4°F | Resp 17 | Ht 70.0 in | Wt 168.9 lb

## 2019-12-14 DIAGNOSIS — Z13220 Encounter for screening for lipoid disorders: Secondary | ICD-10-CM | POA: Diagnosis not present

## 2019-12-14 DIAGNOSIS — Z9079 Acquired absence of other genital organ(s): Secondary | ICD-10-CM | POA: Diagnosis not present

## 2019-12-14 DIAGNOSIS — I10 Essential (primary) hypertension: Secondary | ICD-10-CM | POA: Diagnosis not present

## 2019-12-14 DIAGNOSIS — Z79899 Other long term (current) drug therapy: Secondary | ICD-10-CM | POA: Insufficient documentation

## 2019-12-14 DIAGNOSIS — C61 Malignant neoplasm of prostate: Secondary | ICD-10-CM

## 2019-12-14 DIAGNOSIS — E876 Hypokalemia: Secondary | ICD-10-CM | POA: Insufficient documentation

## 2019-12-14 DIAGNOSIS — Z7952 Long term (current) use of systemic steroids: Secondary | ICD-10-CM | POA: Insufficient documentation

## 2019-12-14 DIAGNOSIS — C7951 Secondary malignant neoplasm of bone: Secondary | ICD-10-CM | POA: Diagnosis not present

## 2019-12-14 MED ORDER — DENOSUMAB 120 MG/1.7ML ~~LOC~~ SOLN
120.0000 mg | Freq: Once | SUBCUTANEOUS | Status: AC
  Administered 2019-12-14: 120 mg via SUBCUTANEOUS

## 2019-12-14 MED ORDER — DENOSUMAB 120 MG/1.7ML ~~LOC~~ SOLN
SUBCUTANEOUS | Status: AC
  Filled 2019-12-14: qty 1.7

## 2019-12-14 NOTE — Patient Instructions (Signed)
Denosumab injection What is this medicine? DENOSUMAB (den oh sue mab) slows bone breakdown. Prolia is used to treat osteoporosis in women after menopause and in men, and in people who are taking corticosteroids for 6 months or more. Xgeva is used to treat a high calcium level due to cancer and to prevent bone fractures and other bone problems caused by multiple myeloma or cancer bone metastases. Xgeva is also used to treat giant cell tumor of the bone. This medicine may be used for other purposes; ask your health care provider or pharmacist if you have questions. COMMON BRAND NAME(S): Prolia, XGEVA What should I tell my health care provider before I take this medicine? They need to know if you have any of these conditions:  dental disease  having surgery or tooth extraction  infection  kidney disease  low levels of calcium or Vitamin D in the blood  malnutrition  on hemodialysis  skin conditions or sensitivity  thyroid or parathyroid disease  an unusual reaction to denosumab, other medicines, foods, dyes, or preservatives  pregnant or trying to get pregnant  breast-feeding How should I use this medicine? This medicine is for injection under the skin. It is given by a health care professional in a hospital or clinic setting. A special MedGuide will be given to you before each treatment. Be sure to read this information carefully each time. For Prolia, talk to your pediatrician regarding the use of this medicine in children. Special care may be needed. For Xgeva, talk to your pediatrician regarding the use of this medicine in children. While this drug may be prescribed for children as young as 13 years for selected conditions, precautions do apply. Overdosage: If you think you have taken too much of this medicine contact a poison control center or emergency room at once. NOTE: This medicine is only for you. Do not share this medicine with others. What if I miss a dose? It is  important not to miss your dose. Call your doctor or health care professional if you are unable to keep an appointment. What may interact with this medicine? Do not take this medicine with any of the following medications:  other medicines containing denosumab This medicine may also interact with the following medications:  medicines that lower your chance of fighting infection  steroid medicines like prednisone or cortisone This list may not describe all possible interactions. Give your health care provider a list of all the medicines, herbs, non-prescription drugs, or dietary supplements you use. Also tell them if you smoke, drink alcohol, or use illegal drugs. Some items may interact with your medicine. What should I watch for while using this medicine? Visit your doctor or health care professional for regular checks on your progress. Your doctor or health care professional may order blood tests and other tests to see how you are doing. Call your doctor or health care professional for advice if you get a fever, chills or sore throat, or other symptoms of a cold or flu. Do not treat yourself. This drug may decrease your body's ability to fight infection. Try to avoid being around people who are sick. You should make sure you get enough calcium and vitamin D while you are taking this medicine, unless your doctor tells you not to. Discuss the foods you eat and the vitamins you take with your health care professional. See your dentist regularly. Brush and floss your teeth as directed. Before you have any dental work done, tell your dentist you are   receiving this medicine. Do not become pregnant while taking this medicine or for 5 months after stopping it. Talk with your doctor or health care professional about your birth control options while taking this medicine. Women should inform their doctor if they wish to become pregnant or think they might be pregnant. There is a potential for serious side  effects to an unborn child. Talk to your health care professional or pharmacist for more information. What side effects may I notice from receiving this medicine? Side effects that you should report to your doctor or health care professional as soon as possible:  allergic reactions like skin rash, itching or hives, swelling of the face, lips, or tongue  bone pain  breathing problems  dizziness  jaw pain, especially after dental work  redness, blistering, peeling of the skin  signs and symptoms of infection like fever or chills; cough; sore throat; pain or trouble passing urine  signs of low calcium like fast heartbeat, muscle cramps or muscle pain; pain, tingling, numbness in the hands or feet; seizures  unusual bleeding or bruising  unusually weak or tired Side effects that usually do not require medical attention (report to your doctor or health care professional if they continue or are bothersome):  constipation  diarrhea  headache  joint pain  loss of appetite  muscle pain  runny nose  tiredness  upset stomach This list may not describe all possible side effects. Call your doctor for medical advice about side effects. You may report side effects to FDA at 1-800-FDA-1088. Where should I keep my medicine? This medicine is only given in a clinic, doctor's office, or other health care setting and will not be stored at home. NOTE: This sheet is a summary. It may not cover all possible information. If you have questions about this medicine, talk to your doctor, pharmacist, or health care provider.  2020 Elsevier/Gold Standard (2017-07-08 16:10:44)

## 2019-12-14 NOTE — Progress Notes (Signed)
Hematology and Oncology Follow Up Visit  Ethan [name] [id number] 1950/09/13 69 y.o. 12/14/2019 2:28 PM Ethan [name], Ethan [name], Ethan [name], Ethan Salinas, Ethan [name]   Principle Diagnosis: 69 year old man with castration-sensitive prostate cancer with disease involvement in the bone and lymphadenopathy diagnosed in 2019.  He presented with PSA 542 and Gleason score of 9.  Prior Therapy:   He is status post orchiectomy completed by Dr. Jeffie Pollock on September 08, 2017.  Current therapy:  Zytiga 1000 mg daily with prednisone started in July 2019.  Xgeva on 120 mg every 8 weeks.  Interim History: Mr. Day is here for a follow-up visit.  Since the last visit, he reports no major changes in his health. He denies any increased bone pain or pathological fractures. He denies any recent hospitalization or illnesses. He continues to tolerate Zytiga without any increased side effects. Denies any edema or urinary frequency.  Medications: Unchanged on review. Current Outpatient Medications  Medication Sig Dispense Refill  . Calcium Carb-Cholecalciferol (CALTRATE 600+D3 PO) Take 2 tablets by mouth daily.    . hydrochlorothiazide (MICROZIDE) 12.5 MG capsule TAKE 1 CAPSULE BY MOUTH EVERY DAY 90 capsule 1  . KLOR-CON M20 20 MEQ tablet TAKE 1 TABLET BY MOUTH EVERY DAY 90 tablet 1  . Multiple Vitamin (MULTIVITAMIN) tablet Take 1 tablet by mouth daily.    . predniSONE (DELTASONE) 5 MG tablet TAKE 1 TABLET BY MOUTH EVERY DAY WITH BREAKFAST 90 tablet 3  . tamsulosin (FLOMAX) 0.4 MG CAPS capsule TAKE 1 CAPSULE (0.4 MG TOTAL) BY MOUTH DAILY AFTER SUPPER. 90 capsule 1  . valsartan (DIOVAN) 80 MG tablet Take 80 mg by mouth daily.    Marland Kitchen zolpidem (AMBIEN) 10 MG tablet Take 5 mg by mouth at bedtime as needed for sleep.     Marland Kitchen ZYTIGA 500 MG tablet TAKE 2 TABLETS (1,000 MG TOTAL) BY MOUTH DAILY. TAKE ON AN EMPTY STOMACH 1 HOUR BEFORE OR 2 HOURS AFTER A MEAL. 60 tablet 0   No current facility-administered medications for this visit.      Allergies: No Known Allergies      Physical Exam:       Blood pressure 112/75, pulse 82, temperature (!) 97.4 F (36.3 C), temperature source Tympanic, resp. rate 17, height 5\' 10"  (1.778 m), weight 168 lb 14.4 oz (76.6 kg), SpO2 100 %.      ECOG: 0   General appearance: Alert, awake without any distress. Head: Atraumatic without abnormalities Oropharynx: Without any thrush or ulcers. Eyes: No scleral icterus. Lymph nodes: No lymphadenopathy noted in the cervical, supraclavicular, or axillary nodes Heart:regular rate and rhythm, without any murmurs or gallops.   Lung: Clear to auscultation without any rhonchi, wheezes or dullness to percussion. Abdomin: Soft, nontender without any shifting dullness or ascites. Musculoskeletal: No clubbing or cyanosis. Neurological: No motor or sensory deficits. Skin: No rashes or lesions.     .             Lab Results: Lab Results  Component Value Date   WBC 7.6 12/11/2019   HGB 13.6 12/11/2019   HCT 40.0 12/11/2019   MCV 91.7 12/11/2019   PLT 258 12/11/2019     Chemistry      Component Value Date/Time   NA 140 12/11/2019 0808   K 4.3 12/11/2019 0808   CL 103 12/11/2019 0808   CO2 27 12/11/2019 0808   BUN 16 12/11/2019 0808   CREATININE 0.80 12/11/2019 0808      Component Value Date/Time   CALCIUM 9.4  12/11/2019 0808   ALKPHOS 55 12/11/2019 0808   AST 20 12/11/2019 0808   ALT 16 12/11/2019 0808   BILITOT 1.2 12/11/2019 0808          Results for Ethan [name], [name] (MRN [id number]) as of 12/14/2019 14:31  Ref. Range 07/27/2019 07:59 10/02/2019 07:52 12/11/2019 08:08  Prostate Specific Ag, Serum Latest Ref Range: 0.0 - 4.0 ng/mL 11.3 (H) 10.5 (H) 10.2 (H)            Impression and Plan:   69 year old man with:  1.   Castration-sensitive advanced prostate cancer with disease to the bone and lymphadenopathy diagnosed in 2019.  He has tolerated Zytiga very well without any major  complications with excellent PSA response at this time.  Laboratory data from December 11, 2019 showed a PSA of 10.2 without any other abnormalities.  Risks and benefits of continuing this treatment long-term were discussed.  Alternative options such as systemic chemotherapy among others were reiterated. He is agreeable to continue at this time.    2.  Androgen deprivation: He status post orchiectomy without any additional androgen deprivation needed.  3.  Hypertension: No issues with blood pressure reported at this time. We will continue to monitor.  4.    Bone directed therapy: Risks and benefits of resuming Delton See were discussed today. He did not have any dental work at this time and should be eligible to receive it. 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   5.  Prognosis and goals of care: Therapy remains palliative although aggressive measures are warranted given his excellent performance status.  6.  Hypocalcemia: normalized at this time with calcium supplements.  7.  Hypokalemia: No issues reported with his potassium and will monitor on Zytiga.  8.  Follow-up: In 2 months for repeat follow-up.  30 minutes were spent on this encounter. The time was dedicated to reviewing his disease status discussing alternative treatment options and future plan of care review.   Ethan Button, Ethan [name] 10/1/20212:28 PM

## 2019-12-15 ENCOUNTER — Encounter: Payer: Self-pay | Admitting: Oncology

## 2019-12-17 DIAGNOSIS — Z23 Encounter for immunization: Secondary | ICD-10-CM | POA: Diagnosis not present

## 2019-12-18 ENCOUNTER — Telehealth: Payer: Self-pay | Admitting: Oncology

## 2019-12-18 NOTE — Telephone Encounter (Signed)
R/s appt per 10/4 sch msg - unable to reach pt . Left message with new appt date and time and call back number.

## 2020-01-07 ENCOUNTER — Other Ambulatory Visit: Payer: Self-pay | Admitting: Oncology

## 2020-01-07 DIAGNOSIS — C61 Malignant neoplasm of prostate: Secondary | ICD-10-CM

## 2020-01-10 MED FILL — ZYTIGA 500 MG TAB: 500 | 30 days supply | Qty: 60 | Fill #0

## 2020-01-31 ENCOUNTER — Other Ambulatory Visit: Payer: Self-pay | Admitting: Oncology

## 2020-01-31 DIAGNOSIS — C61 Malignant neoplasm of prostate: Secondary | ICD-10-CM

## 2020-02-13 ENCOUNTER — Ambulatory Visit: Payer: Medicare Other | Admitting: Oncology

## 2020-02-13 ENCOUNTER — Inpatient Hospital Stay: Payer: Medicare Other | Attending: Oncology

## 2020-02-13 ENCOUNTER — Ambulatory Visit: Payer: Medicare Other

## 2020-02-13 ENCOUNTER — Other Ambulatory Visit: Payer: Self-pay

## 2020-02-13 DIAGNOSIS — C61 Malignant neoplasm of prostate: Secondary | ICD-10-CM | POA: Insufficient documentation

## 2020-02-13 DIAGNOSIS — Z13220 Encounter for screening for lipoid disorders: Secondary | ICD-10-CM

## 2020-02-13 DIAGNOSIS — E876 Hypokalemia: Secondary | ICD-10-CM | POA: Diagnosis not present

## 2020-02-13 DIAGNOSIS — C7951 Secondary malignant neoplasm of bone: Secondary | ICD-10-CM | POA: Diagnosis not present

## 2020-02-13 DIAGNOSIS — Z79899 Other long term (current) drug therapy: Secondary | ICD-10-CM | POA: Insufficient documentation

## 2020-02-13 LAB — CBC WITH DIFFERENTIAL (CANCER CENTER ONLY)
Abs Immature Granulocytes: 0.02 10*3/uL (ref 0.00–0.07)
Basophils Absolute: 0.1 10*3/uL (ref 0.0–0.1)
Basophils Relative: 1 %
Eosinophils Absolute: 0.5 10*3/uL (ref 0.0–0.5)
Eosinophils Relative: 6 %
HCT: 42.3 % (ref 39.0–52.0)
Hemoglobin: 14.4 g/dL (ref 13.0–17.0)
Immature Granulocytes: 0 %
Lymphocytes Relative: 28 %
Lymphs Abs: 2.1 10*3/uL (ref 0.7–4.0)
MCH: 31.6 pg (ref 26.0–34.0)
MCHC: 34 g/dL (ref 30.0–36.0)
MCV: 93 fL (ref 80.0–100.0)
Monocytes Absolute: 0.7 10*3/uL (ref 0.1–1.0)
Monocytes Relative: 9 %
Neutro Abs: 4.2 10*3/uL (ref 1.7–7.7)
Neutrophils Relative %: 56 %
Platelet Count: 286 10*3/uL (ref 150–400)
RBC: 4.55 MIL/uL (ref 4.22–5.81)
RDW: 11.9 % (ref 11.5–15.5)
WBC Count: 7.6 10*3/uL (ref 4.0–10.5)
nRBC: 0 % (ref 0.0–0.2)

## 2020-02-13 LAB — CMP (CANCER CENTER ONLY)
ALT: 15 U/L (ref 0–44)
AST: 20 U/L (ref 15–41)
Albumin: 4.4 g/dL (ref 3.5–5.0)
Alkaline Phosphatase: 55 U/L (ref 38–126)
Anion gap: 8 (ref 5–15)
BUN: 17 mg/dL (ref 8–23)
CO2: 27 mmol/L (ref 22–32)
Calcium: 9.4 mg/dL (ref 8.9–10.3)
Chloride: 105 mmol/L (ref 98–111)
Creatinine: 0.82 mg/dL (ref 0.61–1.24)
GFR, Estimated: >60 mL/min (ref >60)
Glucose, Bld: 102 mg/dL — ABNORMAL HIGH (ref 70–99)
Potassium: 3.7 mmol/L (ref 3.5–5.1)
Sodium: 140 mmol/L (ref 135–145)
Total Bilirubin: 1.1 mg/dL (ref 0.3–1.2)
Total Protein: 7.2 g/dL (ref 6.5–8.1)

## 2020-02-13 LAB — LIPID PANEL
Cholesterol: 157 mg/dL (ref 0–200)
HDL: 53 mg/dL (ref >40)
LDL Cholesterol: 90 mg/dL (ref 0–99)
Total CHOL/HDL Ratio: 3 RATIO
Triglycerides: 71 mg/dL (ref <150)
VLDL: 14 mg/dL (ref 0–40)

## 2020-02-13 MED FILL — ZYTIGA 500 MG TAB: 500 | 30 days supply | Qty: 60 | Fill #0

## 2020-02-14 LAB — PROSTATE-SPECIFIC AG, SERUM (LABCORP): Prostate Specific Ag, Serum: 7.8 ng/mL — ABNORMAL HIGH (ref 0.0–4.0)

## 2020-02-15 ENCOUNTER — Inpatient Hospital Stay: Payer: Medicare Other

## 2020-02-15 ENCOUNTER — Other Ambulatory Visit: Payer: Self-pay | Admitting: Oncology

## 2020-02-15 ENCOUNTER — Other Ambulatory Visit: Payer: Self-pay

## 2020-02-15 ENCOUNTER — Inpatient Hospital Stay (HOSPITAL_BASED_OUTPATIENT_CLINIC_OR_DEPARTMENT_OTHER): Payer: Medicare Other | Admitting: Oncology

## 2020-02-15 VITALS — BP 116/63 | HR 74 | Temp 97.6°F | Resp 18 | Ht 70.0 in | Wt 170.9 lb

## 2020-02-15 DIAGNOSIS — C61 Malignant neoplasm of prostate: Secondary | ICD-10-CM

## 2020-02-15 DIAGNOSIS — E876 Hypokalemia: Secondary | ICD-10-CM | POA: Diagnosis not present

## 2020-02-15 DIAGNOSIS — C7951 Secondary malignant neoplasm of bone: Secondary | ICD-10-CM | POA: Diagnosis not present

## 2020-02-15 DIAGNOSIS — Z79899 Other long term (current) drug therapy: Secondary | ICD-10-CM | POA: Diagnosis not present

## 2020-02-15 MED ORDER — DENOSUMAB 120 MG/1.7ML ~~LOC~~ SOLN
SUBCUTANEOUS | Status: AC
  Filled 2020-02-15: qty 1.7

## 2020-02-15 MED ORDER — DENOSUMAB 120 MG/1.7ML ~~LOC~~ SOLN
120.0000 mg | Freq: Once | SUBCUTANEOUS | Status: AC
  Administered 2020-02-15: 120 mg via SUBCUTANEOUS

## 2020-02-15 NOTE — Patient Instructions (Signed)
Denosumab injection What is this medicine? DENOSUMAB (den oh sue mab) slows bone breakdown. Prolia is used to treat osteoporosis in women after menopause and in men, and in people who are taking corticosteroids for 6 months or more. Xgeva is used to treat a high calcium level due to cancer and to prevent bone fractures and other bone problems caused by multiple myeloma or cancer bone metastases. Xgeva is also used to treat giant cell tumor of the bone. This medicine may be used for other purposes; ask your health care provider or pharmacist if you have questions. COMMON BRAND NAME(S): Prolia, XGEVA What should I tell my health care provider before I take this medicine? They need to know if you have any of these conditions:  dental disease  having surgery or tooth extraction  infection  kidney disease  low levels of calcium or Vitamin D in the blood  malnutrition  on hemodialysis  skin conditions or sensitivity  thyroid or parathyroid disease  an unusual reaction to denosumab, other medicines, foods, dyes, or preservatives  pregnant or trying to get pregnant  breast-feeding How should I use this medicine? This medicine is for injection under the skin. It is given by a health care professional in a hospital or clinic setting. A special MedGuide will be given to you before each treatment. Be sure to read this information carefully each time. For Prolia, talk to your pediatrician regarding the use of this medicine in children. Special care may be needed. For Xgeva, talk to your pediatrician regarding the use of this medicine in children. While this drug may be prescribed for children as young as 13 years for selected conditions, precautions do apply. Overdosage: If you think you have taken too much of this medicine contact a poison control center or emergency room at once. NOTE: This medicine is only for you. Do not share this medicine with others. What if I miss a dose? It is  important not to miss your dose. Call your doctor or health care professional if you are unable to keep an appointment. What may interact with this medicine? Do not take this medicine with any of the following medications:  other medicines containing denosumab This medicine may also interact with the following medications:  medicines that lower your chance of fighting infection  steroid medicines like prednisone or cortisone This list may not describe all possible interactions. Give your health care provider a list of all the medicines, herbs, non-prescription drugs, or dietary supplements you use. Also tell them if you smoke, drink alcohol, or use illegal drugs. Some items may interact with your medicine. What should I watch for while using this medicine? Visit your doctor or health care professional for regular checks on your progress. Your doctor or health care professional may order blood tests and other tests to see how you are doing. Call your doctor or health care professional for advice if you get a fever, chills or sore throat, or other symptoms of a cold or flu. Do not treat yourself. This drug may decrease your body's ability to fight infection. Try to avoid being around people who are sick. You should make sure you get enough calcium and vitamin D while you are taking this medicine, unless your doctor tells you not to. Discuss the foods you eat and the vitamins you take with your health care professional. See your dentist regularly. Brush and floss your teeth as directed. Before you have any dental work done, tell your dentist you are   receiving this medicine. Do not become pregnant while taking this medicine or for 5 months after stopping it. Talk with your doctor or health care professional about your birth control options while taking this medicine. Women should inform their doctor if they wish to become pregnant or think they might be pregnant. There is a potential for serious side  effects to an unborn child. Talk to your health care professional or pharmacist for more information. What side effects may I notice from receiving this medicine? Side effects that you should report to your doctor or health care professional as soon as possible:  allergic reactions like skin rash, itching or hives, swelling of the face, lips, or tongue  bone pain  breathing problems  dizziness  jaw pain, especially after dental work  redness, blistering, peeling of the skin  signs and symptoms of infection like fever or chills; cough; sore throat; pain or trouble passing urine  signs of low calcium like fast heartbeat, muscle cramps or muscle pain; pain, tingling, numbness in the hands or feet; seizures  unusual bleeding or bruising  unusually weak or tired Side effects that usually do not require medical attention (report to your doctor or health care professional if they continue or are bothersome):  constipation  diarrhea  headache  joint pain  loss of appetite  muscle pain  runny nose  tiredness  upset stomach This list may not describe all possible side effects. Call your doctor for medical advice about side effects. You may report side effects to FDA at 1-800-FDA-1088. Where should I keep my medicine? This medicine is only given in a clinic, doctor's office, or other health care setting and will not be stored at home. NOTE: This sheet is a summary. It may not cover all possible information. If you have questions about this medicine, talk to your doctor, pharmacist, or health care provider.  2020 Elsevier/Gold Standard (2017-07-08 16:10:44)

## 2020-02-15 NOTE — Progress Notes (Signed)
Hematology and Oncology Follow Up Visit  Ethan [name] [id number] 17-Dec-1950 69 y.o. 02/15/2020 12:50 PM Ethan [name], Ethan [name], MDRankins, Ethan Salinas, MD   Principle Diagnosis: 69 year old man with advanced prostate cancer diagnosed in 2019 after presenting with PSA 542 and Gleason score of 9.  He has castration-sensitive disease with lymphadenopathy and bone involvement.  Prior Therapy:   He is status post orchiectomy completed by Dr. Jeffie Pollock on September 08, 2017.  Current therapy:  Zytiga 1000 mg daily with prednisone started in July 2019.  Xgeva on 120 mg every 8 weeks.  Interim History: [name] is here for repeat follow-up.  Since last visit, he reports no major changes in his health.  He continues to feel well without any major complaints.  He denies any nausea, vomiting or abdominal pain.  Denies excessive fatigue or tiredness.  He denies any complications related to Zytiga.  He denies any bone pain or pathological fractures.  He continues to enjoy excellent quality of life and performance status.  Medications: Reviewed without changes. Current Outpatient Medications  Medication Sig Dispense Refill  . Calcium Carb-Cholecalciferol (CALTRATE 600+D3 PO) Take 2 tablets by mouth daily.    . hydrochlorothiazide (MICROZIDE) 12.5 MG capsule TAKE 1 CAPSULE BY MOUTH EVERY DAY 90 capsule 1  . KLOR-CON M20 20 MEQ tablet TAKE 1 TABLET BY MOUTH EVERY DAY 90 tablet 1  . Multiple Vitamin (MULTIVITAMIN) tablet Take 1 tablet by mouth daily.    . predniSONE (DELTASONE) 5 MG tablet TAKE 1 TABLET BY MOUTH EVERY DAY WITH BREAKFAST 90 tablet 3  . tamsulosin (FLOMAX) 0.4 MG CAPS capsule TAKE 1 CAPSULE (0.4 MG TOTAL) BY MOUTH DAILY AFTER SUPPER. 90 capsule 1  . valsartan (DIOVAN) 80 MG tablet Take 80 mg by mouth daily.    Marland Kitchen zolpidem (AMBIEN) 10 MG tablet Take 5 mg by mouth at bedtime as needed for sleep.     Marland Kitchen ZYTIGA 500 MG tablet TAKE 2 TABLETS (1,000 MG TOTAL) BY MOUTH DAILY. TAKE ON AN EMPTY STOMACH 1 HOUR  BEFORE OR 2 HOURS AFTER A MEAL. 60 tablet 0   No current facility-administered medications for this visit.     Allergies: No Known Allergies      Physical Exam:       Blood pressure 116/63, pulse 74, temperature 97.6 F (36.4 C), temperature source Tympanic, resp. rate 18, height 5\' 10"  (1.778 m), weight 170 lb 14.4 oz (77.5 kg), SpO2 100 %.       ECOG: 0   General appearance: Comfortable appearing without any discomfort Head: Normocephalic without any trauma Oropharynx: Mucous membranes are moist and pink without any thrush or ulcers. Eyes: Pupils are equal and round reactive to light. Lymph nodes: No cervical, supraclavicular, inguinal or axillary lymphadenopathy.   Heart:regular rate and rhythm.  S1 and S2 without leg edema. Lung: Clear without any rhonchi or wheezes.  No dullness to percussion. Abdomin: Soft, nontender, nondistended with good bowel sounds.  No hepatosplenomegaly. Musculoskeletal: No joint deformity or effusion.  Full range of motion noted. Neurological: No deficits noted on motor, sensory and deep tendon reflex exam. Skin: No petechial rash or dryness.  Appeared moist.       .             Lab Results: Lab Results  Component Value Date   WBC 7.6 02/13/2020   HGB 14.4 02/13/2020   HCT 42.3 02/13/2020   MCV 93.0 02/13/2020   PLT 286 02/13/2020     Chemistry  Component Value Date/Time   NA 140 02/13/2020 0815   K 3.7 02/13/2020 0815   CL 105 02/13/2020 0815   CO2 27 02/13/2020 0815   BUN 17 02/13/2020 0815   CREATININE 0.82 02/13/2020 0815      Component Value Date/Time   CALCIUM 9.4 02/13/2020 0815   ALKPHOS 55 02/13/2020 0815   AST 20 02/13/2020 0815   ALT 15 02/13/2020 0815   BILITOT 1.1 02/13/2020 0815         Results for Ethan [name], [name] (MRN [id number]) as of 02/15/2020 12:14  Ref. Range 12/11/2019 08:08 02/13/2020 08:15  Prostate Specific Ag, Serum Latest Ref Range: 0.0 - 4.0 ng/mL 10.2 (H) 7.8  (H)            Impression and Plan:   69 year old man with:  1.    Advanced prostate cancer with lymphadenopathy and bone disease diagnosed in 2019.  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  He continues to be on Zytiga without any major complaints.  Laboratory data obtained on February 13, 2020 were reviewed which showed a PSA that continues to decline at this time.  The natural course of this disease was updated and future treatment options were reiterated at this time.  I recommended continuing Zytiga given his reasonable response and will update his staging work-up and may have 2094 and certainly sooner if he has rising PSA.  He is agreeable to continue at this time.    2.  Androgen deprivation: No additional androgen deprivation therapy is needed.  He is status post orchiectomy.  3.  Hypertension: His blood pressure close to normal range at this time.  4.    Bone directed therapy: He is currently on Xgeva which I recommended continuing.  Complication associated with extreme including osteonecrosis of the jaw and hypocalcemia were discussed.   5.  Prognosis and goals of care: His disease is incurable although aggressive measures are warranted given his excellent performance status.  6.  Hypocalcemia: Calcium level is within normal range on December 1.  I recommend continued calcium with vitamin D supplement.  7.  Hypokalemia: His potassium is within normal range we will continue to monitor on Zytiga.  8.  Follow-up: He will return in 2 months for a follow-up evaluation.  30 minutes were dedicated to this visit.  The time spent on reviewing disease status, reviewing treatment options and future plan of care discussion.   Zola Button, MD 12/3/202112:50 PM

## 2020-02-26 ENCOUNTER — Encounter: Payer: Self-pay | Admitting: Oncology

## 2020-02-29 ENCOUNTER — Other Ambulatory Visit: Payer: Self-pay

## 2020-02-29 ENCOUNTER — Other Ambulatory Visit: Payer: Self-pay | Admitting: Oncology

## 2020-02-29 DIAGNOSIS — C61 Malignant neoplasm of prostate: Secondary | ICD-10-CM

## 2020-02-29 MED ORDER — ABIRATERONE ACETATE 500 MG PO TABS: ORAL_TABLET | ORAL | 0 refills | Status: DC

## 2020-03-04 MED FILL — ZYTIGA 500 MG TAB: 500 | 30 days supply | Qty: 60 | Fill #0

## 2020-03-24 ENCOUNTER — Telehealth: Payer: Self-pay

## 2020-03-24 NOTE — Telephone Encounter (Signed)
Oral Oncology Patient Advocate Encounter   Was successful in securing patient an [phone] grant from Patient [facility]) to provide copayment coverage for Zytiga.  This will keep the out of pocket expense at $0.     I have spoken with the patient.    The billing information is as follows and has been shared with [employer].   Member ID: [id number] Group ID: [id number] RxBin: [id number] Dates of Eligibility: 03/19/20 through 03/18/21  Fund:  [place] [facility] [place] Phone [phone] Fax [phone] 03/24/2020 10:39 AM

## 2020-04-07 ENCOUNTER — Other Ambulatory Visit: Payer: Self-pay | Admitting: Oncology

## 2020-04-07 DIAGNOSIS — C61 Malignant neoplasm of prostate: Secondary | ICD-10-CM

## 2020-04-10 MED FILL — ZYTIGA 500 MG TAB: 500 | 30 days supply | Qty: 60 | Fill #0

## 2020-04-21 ENCOUNTER — Telehealth: Payer: Self-pay | Admitting: Oncology

## 2020-04-21 NOTE — Telephone Encounter (Signed)
Called patient regarding upcoming 02/08 and 02/10 appointment, patient is notified.

## 2020-04-22 ENCOUNTER — Other Ambulatory Visit: Payer: Self-pay

## 2020-04-22 ENCOUNTER — Inpatient Hospital Stay: Payer: Medicare Other | Attending: Oncology

## 2020-04-22 DIAGNOSIS — C7951 Secondary malignant neoplasm of bone: Secondary | ICD-10-CM | POA: Diagnosis not present

## 2020-04-22 DIAGNOSIS — C61 Malignant neoplasm of prostate: Secondary | ICD-10-CM | POA: Diagnosis not present

## 2020-04-22 DIAGNOSIS — Z79899 Other long term (current) drug therapy: Secondary | ICD-10-CM | POA: Diagnosis not present

## 2020-04-22 DIAGNOSIS — Z9079 Acquired absence of other genital organ(s): Secondary | ICD-10-CM | POA: Diagnosis not present

## 2020-04-22 DIAGNOSIS — I10 Essential (primary) hypertension: Secondary | ICD-10-CM | POA: Insufficient documentation

## 2020-04-22 LAB — CMP (CANCER CENTER ONLY)
ALT: 15 U/L (ref 0–44)
AST: 20 U/L (ref 15–41)
Albumin: 4.5 g/dL (ref 3.5–5.0)
Alkaline Phosphatase: 55 U/L (ref 38–126)
Anion gap: 10 (ref 5–15)
BUN: 21 mg/dL (ref 8–23)
CO2: 24 mmol/L (ref 22–32)
Calcium: 9.4 mg/dL (ref 8.9–10.3)
Chloride: 103 mmol/L (ref 98–111)
Creatinine: 0.91 mg/dL (ref 0.61–1.24)
GFR, Estimated: >60 mL/min (ref >60)
Glucose, Bld: 103 mg/dL — ABNORMAL HIGH (ref 70–99)
Potassium: 4.3 mmol/L (ref 3.5–5.1)
Sodium: 137 mmol/L (ref 135–145)
Total Bilirubin: 1.3 mg/dL — ABNORMAL HIGH (ref 0.3–1.2)
Total Protein: 7.2 g/dL (ref 6.5–8.1)

## 2020-04-22 LAB — CBC WITH DIFFERENTIAL (CANCER CENTER ONLY)
Abs Immature Granulocytes: 0.02 10*3/uL (ref 0.00–0.07)
Basophils Absolute: 0 10*3/uL (ref 0.0–0.1)
Basophils Relative: 0 %
Eosinophils Absolute: 0.3 10*3/uL (ref 0.0–0.5)
Eosinophils Relative: 4 %
HCT: 41.7 % (ref 39.0–52.0)
Hemoglobin: 14.3 g/dL (ref 13.0–17.0)
Immature Granulocytes: 0 %
Lymphocytes Relative: 26 %
Lymphs Abs: 1.9 10*3/uL (ref 0.7–4.0)
MCH: 31.4 pg (ref 26.0–34.0)
MCHC: 34.3 g/dL (ref 30.0–36.0)
MCV: 91.4 fL (ref 80.0–100.0)
Monocytes Absolute: 0.7 10*3/uL (ref 0.1–1.0)
Monocytes Relative: 9 %
Neutro Abs: 4.4 10*3/uL (ref 1.7–7.7)
Neutrophils Relative %: 61 %
Platelet Count: 287 10*3/uL (ref 150–400)
RBC: 4.56 MIL/uL (ref 4.22–5.81)
RDW: 11.5 % (ref 11.5–15.5)
WBC Count: 7.2 10*3/uL (ref 4.0–10.5)
nRBC: 0 % (ref 0.0–0.2)

## 2020-04-23 LAB — PROSTATE-SPECIFIC AG, SERUM (LABCORP): Prostate Specific Ag, Serum: 8.1 ng/mL — ABNORMAL HIGH (ref 0.0–4.0)

## 2020-04-24 ENCOUNTER — Inpatient Hospital Stay: Payer: Medicare Other

## 2020-04-24 ENCOUNTER — Other Ambulatory Visit: Payer: Self-pay

## 2020-04-24 ENCOUNTER — Inpatient Hospital Stay (HOSPITAL_BASED_OUTPATIENT_CLINIC_OR_DEPARTMENT_OTHER): Payer: Medicare Other | Admitting: Oncology

## 2020-04-24 VITALS — BP 116/79 | HR 95 | Temp 97.8°F | Resp 18 | Ht 70.0 in | Wt 168.0 lb

## 2020-04-24 DIAGNOSIS — Z9079 Acquired absence of other genital organ(s): Secondary | ICD-10-CM | POA: Diagnosis not present

## 2020-04-24 DIAGNOSIS — C7951 Secondary malignant neoplasm of bone: Secondary | ICD-10-CM | POA: Diagnosis not present

## 2020-04-24 DIAGNOSIS — C61 Malignant neoplasm of prostate: Secondary | ICD-10-CM | POA: Diagnosis not present

## 2020-04-24 DIAGNOSIS — Z79899 Other long term (current) drug therapy: Secondary | ICD-10-CM | POA: Diagnosis not present

## 2020-04-24 DIAGNOSIS — I10 Essential (primary) hypertension: Secondary | ICD-10-CM | POA: Diagnosis not present

## 2020-04-24 MED ORDER — DENOSUMAB 120 MG/1.7ML ~~LOC~~ SOLN
SUBCUTANEOUS | Status: AC
  Filled 2020-04-24: qty 1.7

## 2020-04-24 MED ORDER — DENOSUMAB 120 MG/1.7ML ~~LOC~~ SOLN
120.0000 mg | Freq: Once | SUBCUTANEOUS | Status: AC
  Administered 2020-04-24: 120 mg via SUBCUTANEOUS

## 2020-04-24 NOTE — Patient Instructions (Signed)
Denosumab injection What is this medicine? DENOSUMAB (den oh sue mab) slows bone breakdown. Prolia is used to treat osteoporosis in women after menopause and in men, and in people who are taking corticosteroids for 6 months or more. Xgeva is used to treat a high calcium level due to cancer and to prevent bone fractures and other bone problems caused by multiple myeloma or cancer bone metastases. Xgeva is also used to treat giant cell tumor of the bone. This medicine may be used for other purposes; ask your health care provider or pharmacist if you have questions. COMMON BRAND NAME(S): Prolia, XGEVA What should I tell my health care provider before I take this medicine? They need to know if you have any of these conditions:  dental disease  having surgery or tooth extraction  infection  kidney disease  low levels of calcium or Vitamin D in the blood  malnutrition  on hemodialysis  skin conditions or sensitivity  thyroid or parathyroid disease  an unusual reaction to denosumab, other medicines, foods, dyes, or preservatives  pregnant or trying to get pregnant  breast-feeding How should I use this medicine? This medicine is for injection under the skin. It is given by a health care professional in a hospital or clinic setting. A special MedGuide will be given to you before each treatment. Be sure to read this information carefully each time. For Prolia, talk to your pediatrician regarding the use of this medicine in children. Special care may be needed. For Xgeva, talk to your pediatrician regarding the use of this medicine in children. While this drug may be prescribed for children as young as 13 years for selected conditions, precautions do apply. Overdosage: If you think you have taken too much of this medicine contact a poison control center or emergency room at once. NOTE: This medicine is only for you. Do not share this medicine with others. What if I miss a dose? It is  important not to miss your dose. Call your doctor or health care professional if you are unable to keep an appointment. What may interact with this medicine? Do not take this medicine with any of the following medications:  other medicines containing denosumab This medicine may also interact with the following medications:  medicines that lower your chance of fighting infection  steroid medicines like prednisone or cortisone This list may not describe all possible interactions. Give your health care provider a list of all the medicines, herbs, non-prescription drugs, or dietary supplements you use. Also tell them if you smoke, drink alcohol, or use illegal drugs. Some items may interact with your medicine. What should I watch for while using this medicine? Visit your doctor or health care professional for regular checks on your progress. Your doctor or health care professional may order blood tests and other tests to see how you are doing. Call your doctor or health care professional for advice if you get a fever, chills or sore throat, or other symptoms of a cold or flu. Do not treat yourself. This drug may decrease your body's ability to fight infection. Try to avoid being around people who are sick. You should make sure you get enough calcium and vitamin D while you are taking this medicine, unless your doctor tells you not to. Discuss the foods you eat and the vitamins you take with your health care professional. See your dentist regularly. Brush and floss your teeth as directed. Before you have any dental work done, tell your dentist you are   receiving this medicine. Do not become pregnant while taking this medicine or for 5 months after stopping it. Talk with your doctor or health care professional about your birth control options while taking this medicine. Women should inform their doctor if they wish to become pregnant or think they might be pregnant. There is a potential for serious side  effects to an unborn child. Talk to your health care professional or pharmacist for more information. What side effects may I notice from receiving this medicine? Side effects that you should report to your doctor or health care professional as soon as possible:  allergic reactions like skin rash, itching or hives, swelling of the face, lips, or tongue  bone pain  breathing problems  dizziness  jaw pain, especially after dental work  redness, blistering, peeling of the skin  signs and symptoms of infection like fever or chills; cough; sore throat; pain or trouble passing urine  signs of low calcium like fast heartbeat, muscle cramps or muscle pain; pain, tingling, numbness in the hands or feet; seizures  unusual bleeding or bruising  unusually weak or tired Side effects that usually do not require medical attention (report to your doctor or health care professional if they continue or are bothersome):  constipation  diarrhea  headache  joint pain  loss of appetite  muscle pain  runny nose  tiredness  upset stomach This list may not describe all possible side effects. Call your doctor for medical advice about side effects. You may report side effects to FDA at 1-800-FDA-1088. Where should I keep my medicine? This medicine is only given in a clinic, doctor's office, or other health care setting and will not be stored at home. NOTE: This sheet is a summary. It may not cover all possible information. If you have questions about this medicine, talk to your doctor, pharmacist, or health care provider.  2021 Elsevier/Gold Standard (2017-07-08 16:10:44)

## 2020-04-24 NOTE — Progress Notes (Signed)
Hematology and Oncology Follow Up Visit  Ethan [name] [id number] Nov 18, 1950 70 y.o. 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  He was found to have PSA 542 and Gleason score of 9.   Prior Therapy:   He is status post orchiectomy completed by Dr. Jeffie Pollock on September 08, 2017.  Current therapy:  Zytiga 1000 mg daily with prednisone started in July 2019.  Xgeva on 120 mg every 8 weeks.  Interim History: Ethan [name] is here for repeat evaluation.  Since the last visit, he reports no major changes in his health. He has tolerated Zytiga without any major complaints. He denies any nausea vomiting or abdominal pain. He denies any excessive fatigue or tiredness. Continues to be active and attends activities of daily living. Denies any bone pain or pathological fractures. Denies any hospitalizations or illnesses.  Medications: Unchanged on review. Current Outpatient Medications  Medication Sig Dispense Refill  . Calcium Carb-Cholecalciferol (CALTRATE 600+D3 PO) Take 2 tablets by mouth daily.    . hydrochlorothiazide (MICROZIDE) 12.5 MG capsule TAKE 1 CAPSULE BY MOUTH EVERY DAY 90 capsule 1  . KLOR-CON M20 20 MEQ tablet TAKE 1 TABLET BY MOUTH EVERY DAY 90 tablet 1  . Multiple Vitamin (MULTIVITAMIN) tablet Take 1 tablet by mouth daily.    . predniSONE (DELTASONE) 5 MG tablet TAKE 1 TABLET BY MOUTH EVERY DAY WITH BREAKFAST 90 tablet 3  . tamsulosin (FLOMAX) 0.4 MG CAPS capsule TAKE 1 CAPSULE (0.4 MG TOTAL) BY MOUTH DAILY AFTER SUPPER. 90 capsule 1  . valsartan (DIOVAN) 80 MG tablet Take 80 mg by mouth daily.    Marland Kitchen zolpidem (AMBIEN) 10 MG tablet Take 5 mg by mouth at bedtime as needed for sleep.     Marland Kitchen ZYTIGA 500 MG tablet TAKE 2 TABLETS (1,000 MG TOTAL) BY MOUTH DAILY. TAKE ON AN EMPTY STOMACH 1 HOUR BEFORE OR 2 HOURS AFTER A MEAL. 60  tablet 0   No current facility-administered medications for this visit.     Allergies: No Known Allergies      Physical Exam:  Blood pressure 116/79, pulse 95, temperature 97.8 F (36.6 C), temperature source Tympanic, resp. rate 18, height 5\' 10"  (1.778 m), weight 168 lb (76.2 kg), SpO2 100 %.    ECOG: 0    General appearance: Alert, awake without any distress. Head: Atraumatic without abnormalities Oropharynx: Without any thrush or ulcers. Eyes: No scleral icterus. Lymph nodes: No lymphadenopathy noted in the cervical, supraclavicular, or axillary nodes Heart:regular rate and rhythm, without any murmurs or gallops.   Lung: Clear to auscultation without any rhonchi, wheezes or dullness to percussion. Abdomin: Soft, nontender without any shifting dullness or ascites. Musculoskeletal: No clubbing or cyanosis. Neurological: No motor or sensory deficits. Skin: No rashes or lesions.        .             Lab Results: Lab Results  Component Value Date   WBC 7.2 04/22/2020   HGB 14.3 04/22/2020   HCT 41.7 04/22/2020   MCV 91.4 04/22/2020   PLT 287 04/22/2020     Chemistry      Component Value Date/Time   NA 137 04/22/2020 0749   K 4.3 04/22/2020 0749   CL 103 04/22/2020 0749   CO2 24 04/22/2020 0749   BUN 21 04/22/2020 0749   CREATININE 0.91 04/22/2020 0749  Component Value Date/Time   CALCIUM 9.4 04/22/2020 0749   ALKPHOS 55 04/22/2020 0749   AST 20 04/22/2020 0749   ALT 15 04/22/2020 0749   BILITOT 1.3 (H) 04/22/2020 0749         Results for Ethan [name], Ethan [name]" (MRN [id number]) as of 04/24/2020 12:31  Ref. Range 04/22/2020 07:49  Prostate Specific Ag, Serum Latest Ref Range: 0.0 - 4.0 ng/mL 8.1 (H)             Impression and Plan:   70 year old man with:  1.    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    The natural course of his disease was updated at  this time and treatment options reviewed.  He is currently on Zytiga with excellent PSA response overall.  His PSA obtained on April 22, 2020 showed a slight rise but overall stable counts over the last 6 months.  Alternative treatment options such as Taxotere chemotherapy will be deferred for the time being.     2.  Androgen deprivation: He has not required any additional androgen deprivation.  He is status post orchiectomy..  3.  Hypertension: His blood pressure is close to normal range at this time.  4.    Bone directed therapy: Risks and benefits of continuing Xgeva long-term were discussed.  Potential complications that include osteonecrosis of the jaw and hypocalcemia were reviewed and he is agreeable to proceed.  His calcium level is adequate at this time.  5.  Prognosis and goals of care: Therapy remains palliative low risk meds are warranted given his excellent performance status.  6.  Hypocalcemia: Corrected at this time with supplements.  This is related to Ethan [name].  7.  Hypokalemia: His potassium level is back within normal range at this time.  8.  Follow-up: In 8 weeks for repeat follow-up.  30 minutes were spent on this encounter.  Time was dedicated to reviewing disease status, discussing treatment options and future plan of care reviewed.   Zola Button, MD 2/10/202212:28 PM

## 2020-05-05 ENCOUNTER — Other Ambulatory Visit: Payer: Self-pay | Admitting: Oncology

## 2020-05-05 DIAGNOSIS — C61 Malignant neoplasm of prostate: Secondary | ICD-10-CM

## 2020-05-06 ENCOUNTER — Other Ambulatory Visit: Payer: Self-pay | Admitting: Oncology

## 2020-05-08 MED FILL — ZYTIGA 500 MG TAB: 500 | 30 days supply | Qty: 60 | Fill #0

## 2020-05-21 ENCOUNTER — Other Ambulatory Visit: Payer: Self-pay | Admitting: Oncology

## 2020-05-22 IMAGING — CT CT SHOULDER*L* W/O CM
1 of 2 series · 3 of 14 positions shown, 4 images · non-contrast
Comparison: Left shoulder and humerus x-rays from same day.

CLINICAL DATA: Left proximal humerus fracture.

EXAM:
CT OF THE UPPER LEFT EXTREMITY WITHOUT CONTRAST
TECHNIQUE: Multidetector CT imaging of the upper left extremity was performed
according to the standard protocol.

[Series 6: ax st · axial · 0.44mm/px · z∈[-232,-20]mm · 3 of 107 slices shown, 4 images]
[im 1/107  soft-tissue]
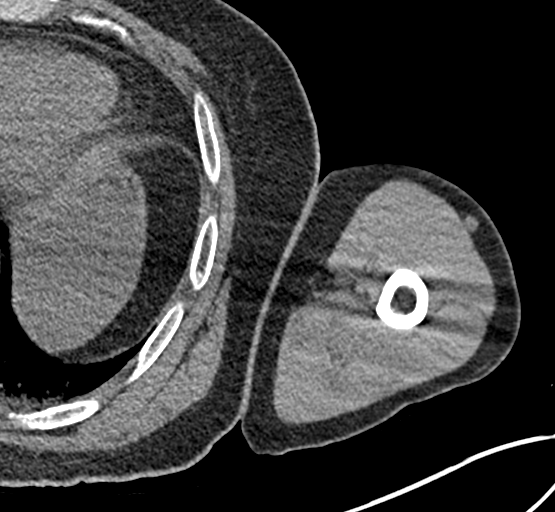
[im 1/107  bone]
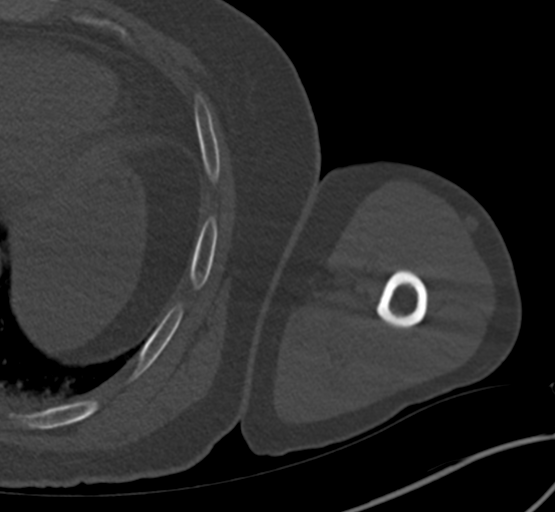
[im 54/107  bone]
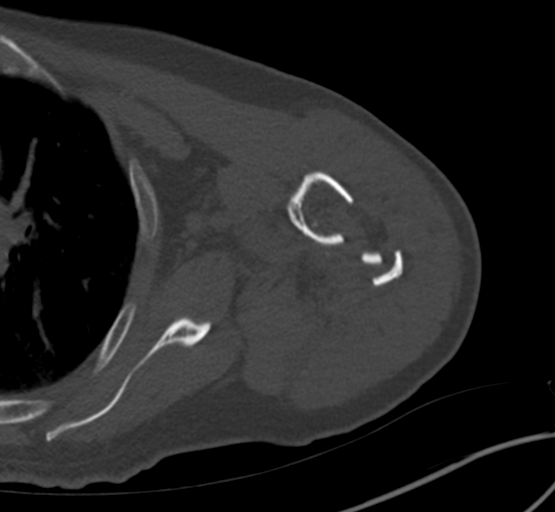
[im 107/107  bone]
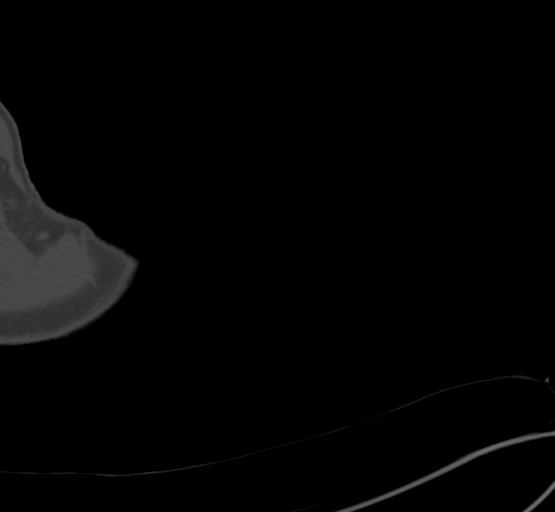

[3 of 14 positions shown; findings below may reference images not displayed]

FINDINGS: Bones/Joint/Cartilage

Comminuted, and angulated fracture of the proximal humerus involving
the proximal surgical neck and diaphysis. The surgical neck fracture
demonstrates 9 mm of impaction, 7 mm anterior displacement, and
posterior angulation. The fracture extends into the inferior aspect
of the greater tuberosity.

The oblique fracture through the proximal diaphysis demonstrates
cm posterior displacement, 1.7 cm of overlapping fragments, and
anterior angulation.

No discrete underlying bony lesion. No dislocation. Mild
acromioclavicular and glenohumeral osteoarthritis. No joint
effusion.

Ligaments

Suboptimally assessed by CT.

Muscles and Tendons

No muscle atrophy. Rotator cuff tendons are not well evaluated by
CT.

Soft tissues

Mild soft tissue swelling and small amount of hematoma surrounding
the fracture. No focal fluid collection or soft tissue mass.
IMPRESSION: 1. Comminuted, mildly impacted, and angulated fracture involving the
proximal humerus surgical neck and diaphysis.

## 2020-05-22 IMAGING — CR DG HUMERUS 2V *L*
2 series · 2 of 2 positions shown · non-contrast
Comparison: Whole-body PET-CT, 09/02/2017

CLINICAL DATA: Fall

EXAM:
LEFT SHOULDER - 2+ VIEW; LEFT HUMERUS - 2+ VIEW

[x humerus ap left]
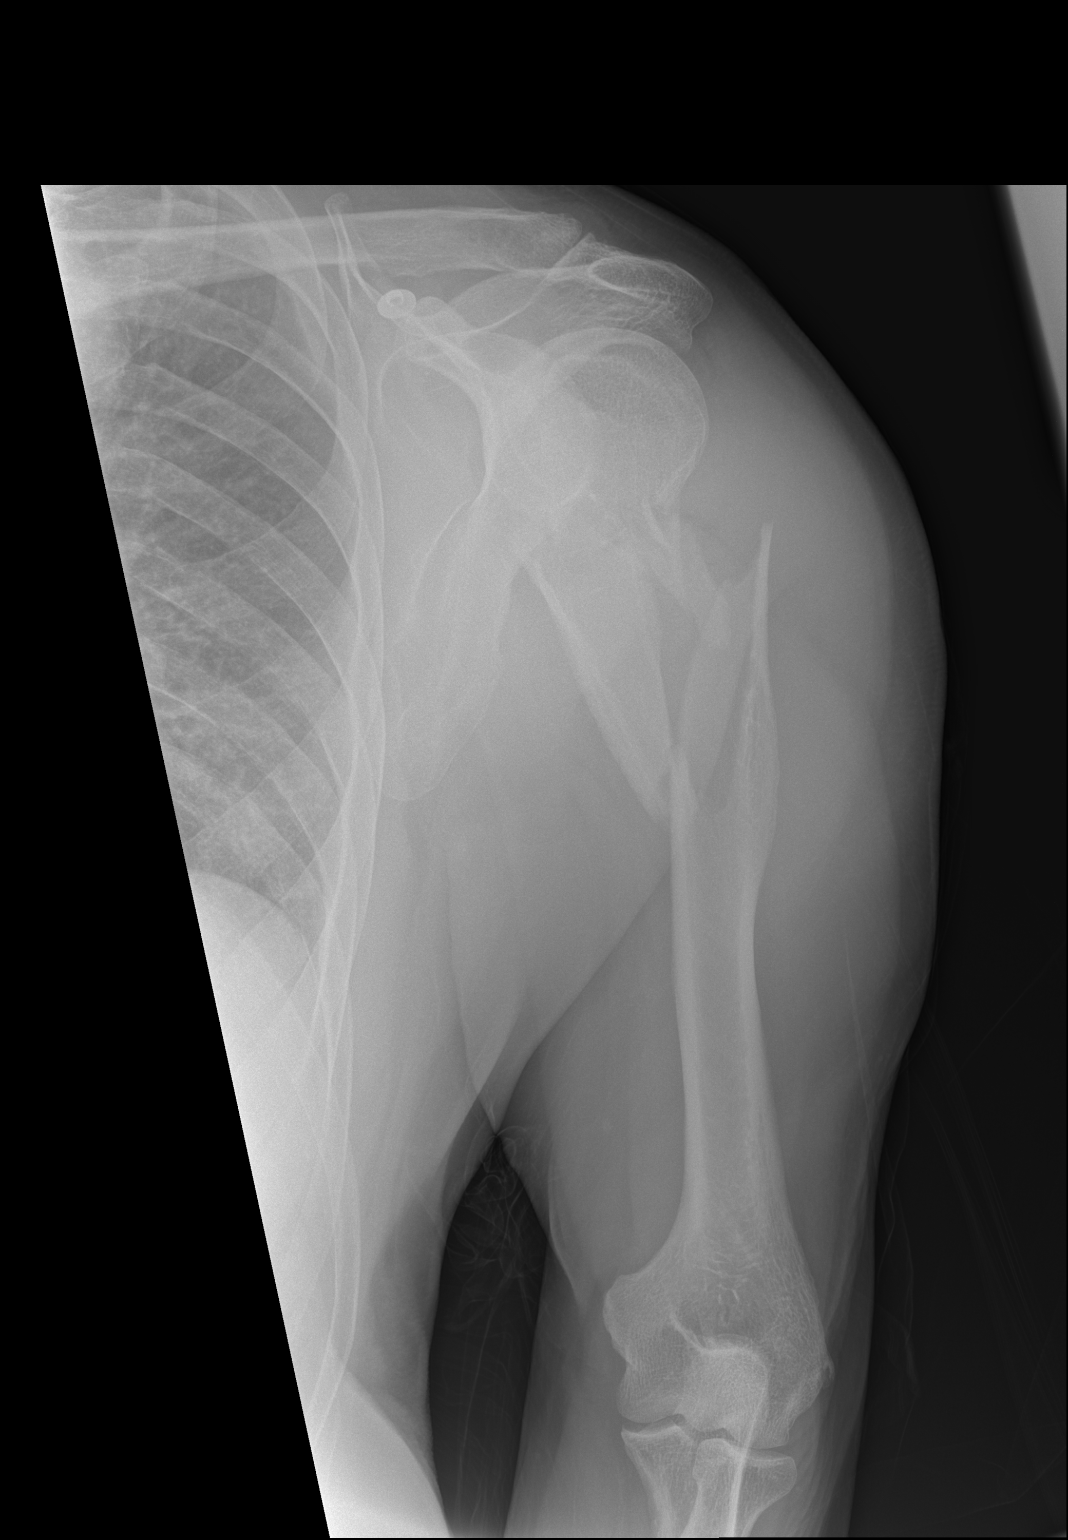

[x humerus lat left]
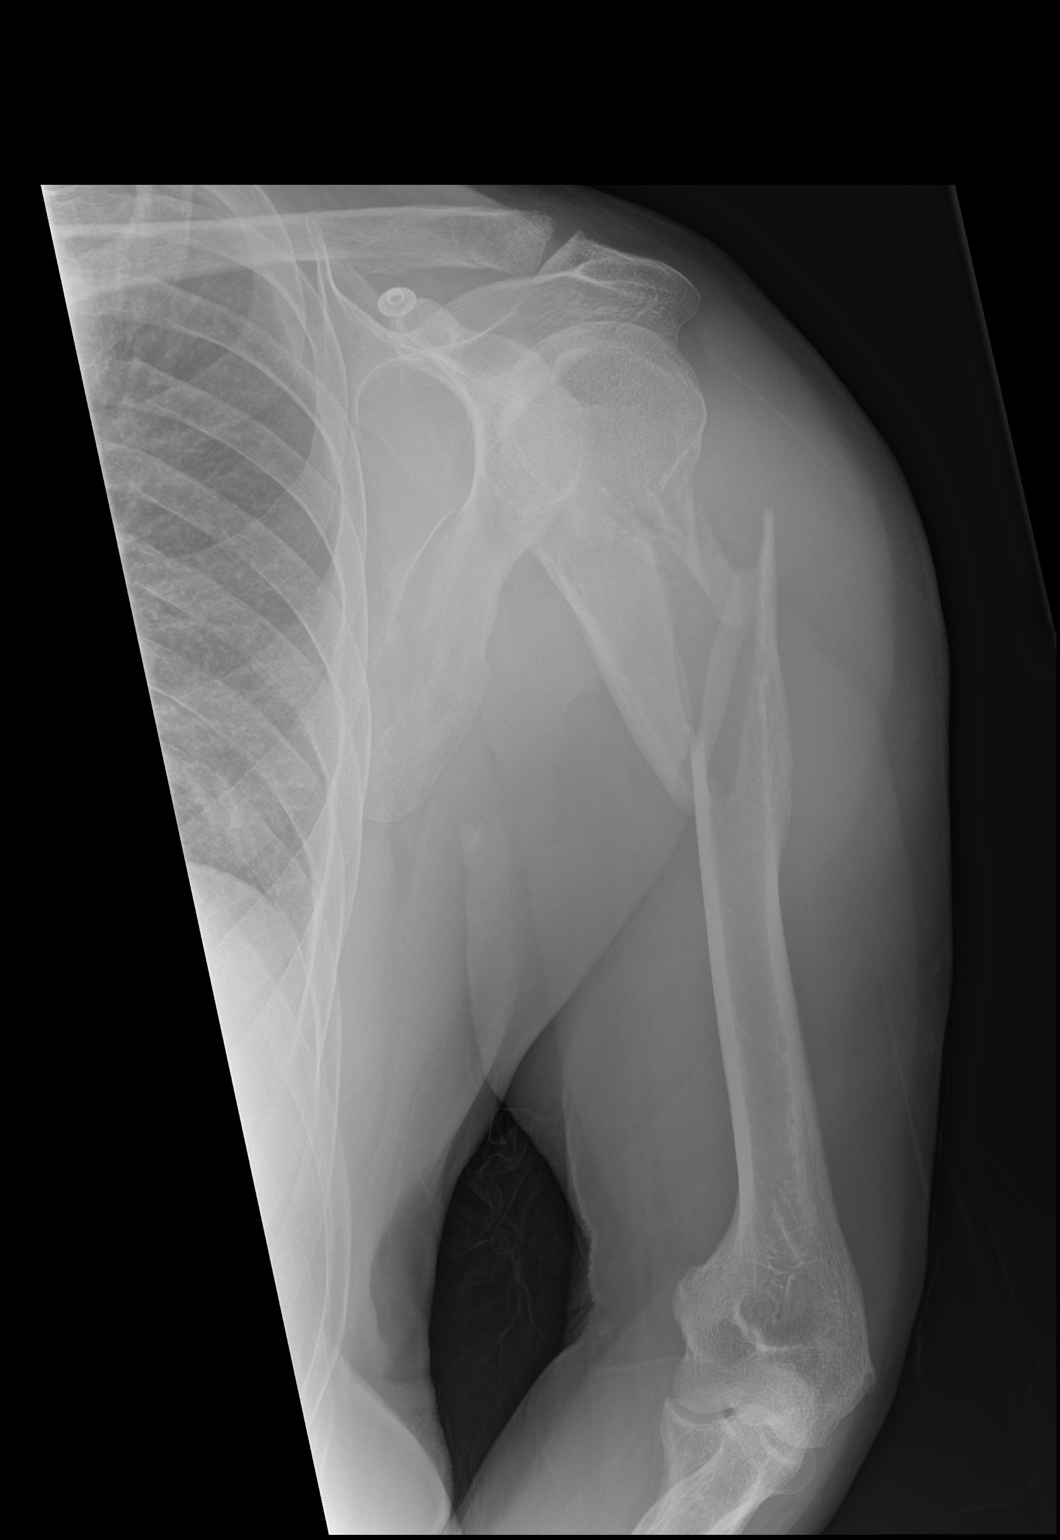

[2 of 2 positions shown; findings below may reference images not displayed]

FINDINGS: There is an angulated and displaced comminuted fracture of the
proximal left humeral diaphysis. The glenohumeral joint remains in
anatomic apposition. There is no evidence of significant arthropathy
or other focal bone abnormality. Soft tissues are unremarkable.
IMPRESSION: There is an angulated and displaced comminuted fracture of the
proximal left humeral diaphysis. The glenohumeral joint remains in
anatomic apposition. Although there is no evidence of metastatic
disease involving the proximal humerus on most recent whole-body
bone scan, underlying metastatic lesion is not strictly excluded in
the setting of known prostate malignancy with osseous metastatic
disease.

## 2020-06-01 ENCOUNTER — Encounter: Payer: Self-pay | Admitting: Oncology

## 2020-06-02 ENCOUNTER — Encounter: Payer: Self-pay | Admitting: Oncology

## 2020-06-02 ENCOUNTER — Other Ambulatory Visit: Payer: Self-pay

## 2020-06-02 MED ORDER — VALSARTAN 80 MG PO TABS: 80.0000 mg | ORAL_TABLET | Freq: Every day | ORAL | 1 refills | Status: DC

## 2020-06-04 ENCOUNTER — Other Ambulatory Visit: Payer: Self-pay | Admitting: Oncology

## 2020-06-04 DIAGNOSIS — C61 Malignant neoplasm of prostate: Secondary | ICD-10-CM

## 2020-06-06 ENCOUNTER — Other Ambulatory Visit (HOSPITAL_COMMUNITY): Payer: Self-pay

## 2020-06-10 MED FILL — ZYTIGA 500 MG TAB: 500 | 30 days supply | Qty: 60 | Fill #0

## 2020-06-21 ENCOUNTER — Encounter: Payer: Self-pay | Admitting: Oncology

## 2020-06-23 ENCOUNTER — Encounter: Payer: Self-pay | Admitting: Oncology

## 2020-06-24 ENCOUNTER — Telehealth: Payer: Self-pay | Admitting: *Deleted

## 2020-06-24 NOTE — Telephone Encounter (Signed)
Phone call to patient, informed him Dr. Alen Blew recommends [facility] as PCP, no referral is needed.  Informed patient there are several offices, he can look them up on the internet and choose which one is most convenient for him.  Patient verbalizes understanding.

## 2020-06-24 NOTE — Telephone Encounter (Signed)
-----   Message from V[place], Generic sent at 06/24/2020  2:52 PM EDT ----- Regarding: Your message may not be read Contact: [phone]    ----- Delivery failure of internet email alert  Tickler type: Message Message Id(WMG): [id number] SMTP Response: 149 Patient: [name],[name] Internet alert email: [email or web address]     ----- Original WMG message to the patient ----- Sent: 06/24/2020  2:50 PM From: Rolene Course, RN To: [name],[name] Message Type: Patient Medical Advice Request Subject: Personal Care Physician  Dr. Alen Blew recommends [facility], no referral is needed.  You can google this, there are several locations for your convenience.  Hope this is helpful.   ----- Message -----      From:Jia Coston      Sent:06/23/2020  4:08 PM EDT        MV:[facility] Alen Blew, MD   Subject:Personal Care Physician   I'm trying to find a new PCP.  I understand [facility] has offices near the [facility].  I would appreciate a referral or recommendation.  Thanks, [name]

## 2020-06-25 ENCOUNTER — Other Ambulatory Visit: Payer: Self-pay

## 2020-06-25 ENCOUNTER — Inpatient Hospital Stay: Payer: Medicare Other | Attending: Oncology

## 2020-06-25 ENCOUNTER — Other Ambulatory Visit: Payer: Self-pay | Admitting: Oncology

## 2020-06-25 ENCOUNTER — Other Ambulatory Visit: Payer: Medicare Other

## 2020-06-25 DIAGNOSIS — E876 Hypokalemia: Secondary | ICD-10-CM | POA: Insufficient documentation

## 2020-06-25 DIAGNOSIS — C61 Malignant neoplasm of prostate: Secondary | ICD-10-CM | POA: Insufficient documentation

## 2020-06-25 DIAGNOSIS — I10 Essential (primary) hypertension: Secondary | ICD-10-CM | POA: Insufficient documentation

## 2020-06-25 DIAGNOSIS — C7951 Secondary malignant neoplasm of bone: Secondary | ICD-10-CM | POA: Diagnosis not present

## 2020-06-25 LAB — CBC WITH DIFFERENTIAL (CANCER CENTER ONLY)
Abs Immature Granulocytes: 0.02 10*3/uL (ref 0.00–0.07)
Basophils Absolute: 0 10*3/uL (ref 0.0–0.1)
Basophils Relative: 1 %
Eosinophils Absolute: 0.3 10*3/uL (ref 0.0–0.5)
Eosinophils Relative: 4 %
HCT: 39.7 % (ref 39.0–52.0)
Hemoglobin: 13.7 g/dL (ref 13.0–17.0)
Immature Granulocytes: 0 %
Lymphocytes Relative: 28 %
Lymphs Abs: 1.8 10*3/uL (ref 0.7–4.0)
MCH: 31.6 pg (ref 26.0–34.0)
MCHC: 34.5 g/dL (ref 30.0–36.0)
MCV: 91.7 fL (ref 80.0–100.0)
Monocytes Absolute: 0.6 10*3/uL (ref 0.1–1.0)
Monocytes Relative: 9 %
Neutro Abs: 3.6 10*3/uL (ref 1.7–7.7)
Neutrophils Relative %: 58 %
Platelet Count: 266 10*3/uL (ref 150–400)
RBC: 4.33 MIL/uL (ref 4.22–5.81)
RDW: 11.9 % (ref 11.5–15.5)
WBC Count: 6.3 10*3/uL (ref 4.0–10.5)
nRBC: 0 % (ref 0.0–0.2)

## 2020-06-25 LAB — CMP (CANCER CENTER ONLY)
ALT: 12 U/L (ref 0–44)
AST: 18 U/L (ref 15–41)
Albumin: 4.4 g/dL (ref 3.5–5.0)
Alkaline Phosphatase: 48 U/L (ref 38–126)
Anion gap: 12 (ref 5–15)
BUN: 23 mg/dL (ref 8–23)
CO2: 23 mmol/L (ref 22–32)
Calcium: 9.2 mg/dL (ref 8.9–10.3)
Chloride: 105 mmol/L (ref 98–111)
Creatinine: 0.8 mg/dL (ref 0.61–1.24)
GFR, Estimated: >60 mL/min (ref >60)
Glucose, Bld: 99 mg/dL (ref 70–99)
Potassium: 3.8 mmol/L (ref 3.5–5.1)
Sodium: 140 mmol/L (ref 135–145)
Total Bilirubin: 1.2 mg/dL (ref 0.3–1.2)
Total Protein: 7 g/dL (ref 6.5–8.1)

## 2020-06-26 LAB — PROSTATE-SPECIFIC AG, SERUM (LABCORP): Prostate Specific Ag, Serum: 8.6 ng/mL — ABNORMAL HIGH (ref 0.0–4.0)

## 2020-06-27 ENCOUNTER — Other Ambulatory Visit: Payer: Self-pay

## 2020-06-27 ENCOUNTER — Inpatient Hospital Stay (HOSPITAL_BASED_OUTPATIENT_CLINIC_OR_DEPARTMENT_OTHER): Payer: Medicare Other | Admitting: Oncology

## 2020-06-27 VITALS — BP 132/78 | HR 86 | Temp 96.5°F | Resp 18 | Ht 70.0 in | Wt 163.6 lb

## 2020-06-27 DIAGNOSIS — I10 Essential (primary) hypertension: Secondary | ICD-10-CM | POA: Diagnosis not present

## 2020-06-27 DIAGNOSIS — C61 Malignant neoplasm of prostate: Secondary | ICD-10-CM | POA: Diagnosis not present

## 2020-06-27 DIAGNOSIS — C7951 Secondary malignant neoplasm of bone: Secondary | ICD-10-CM | POA: Diagnosis not present

## 2020-06-27 DIAGNOSIS — E876 Hypokalemia: Secondary | ICD-10-CM | POA: Diagnosis not present

## 2020-06-27 MED ORDER — DENOSUMAB 120 MG/1.7ML ~~LOC~~ SOLN
120.0000 mg | Freq: Once | SUBCUTANEOUS | Status: AC
Start: 2020-06-27 — End: 2020-06-27
  Administered 2020-06-27: 120 mg via SUBCUTANEOUS

## 2020-06-27 MED ORDER — DENOSUMAB 120 MG/1.7ML ~~LOC~~ SOLN
SUBCUTANEOUS | Status: AC
  Filled 2020-06-27: qty 1.7

## 2020-06-27 NOTE — Progress Notes (Signed)
Hematology and Oncology Follow Up Visit  Ethan [name] [id number] Jan 11, 1951 70 y.o. 06/27/2020 11:42 AM Ethan [name], Ethan [name], Ethan [name], Ethan Salinas, Ethan [name]   Principle Diagnosis: 70 year old man with advanced prostate cancer with lymphadenopathy and bone disease diagnosed in 2019.  He has castration-sensitive disease after presenting with PSA 542 and Gleason score of 9.   Prior Therapy:   He is status post orchiectomy completed by Dr. Jeffie Pollock on September 08, 2017.  Current therapy:  Zytiga 1000 mg daily with prednisone started in July 2019.  Xgeva on 120 mg every 8 weeks.  Interim History: Ethan [name] returns today for a follow-up visit.  Since the last visit, he reports no major changes in his health.  He continues to tolerate Zytiga without any complaints.  He denies any nausea, fatigue or weakness.  He denies any recent hospitalization or illnesses.  He denies any bone pain or pathological fractures.  He continues to exercise regularly including cardiovascular resistant exercises and of lost weight intentionally.  Medications: Updated on review. Current Outpatient Medications  Medication Sig Dispense Refill  . abiraterone acetate (ZYTIGA) 500 MG tablet TAKE 2 TABLETS (1,000 MG TOTAL) BY MOUTH DAILY. TAKE ON AN EMPTY STOMACH 1 HOUR BEFORE OR 2 HOURS AFTER A MEAL. 60 tablet 0  . Calcium Carb-Cholecalciferol (CALTRATE 600+D3 PO) Take 2 tablets by mouth daily.    . hydrochlorothiazide (MICROZIDE) 12.5 MG capsule TAKE 1 CAPSULE BY MOUTH EVERY DAY 90 capsule 1  . KLOR-CON M20 20 MEQ tablet TAKE 1 TABLET BY MOUTH EVERY DAY 90 tablet 1  . Multiple Vitamin (MULTIVITAMIN) tablet Take 1 tablet by mouth daily.    . predniSONE (DELTASONE) 5 MG tablet TAKE 1 TABLET BY MOUTH EVERY DAY WITH BREAKFAST 90 tablet 3  . tamsulosin (FLOMAX) 0.4 MG CAPS capsule TAKE 1 CAPSULE (0.4 MG TOTAL) BY MOUTH DAILY AFTER SUPPER. 90 capsule 1  . valsartan (DIOVAN) 80 MG tablet TAKE 1 TABLET BY MOUTH EVERY DAY 30 tablet 1  .  zolpidem (AMBIEN) 10 MG tablet Take 5 mg by mouth at bedtime as needed for sleep.  (Patient not taking: Reported on 06/27/2020)     No current facility-administered medications for this visit.     Allergies: No Known Allergies      Physical Exam:  Blood pressure 132/78, pulse 86, temperature (!) 96.5 F (35.8 C), temperature source Tympanic, resp. rate 18, height 5\' 10"  (1.778 m), weight 163 lb 9.6 oz (74.2 kg), SpO2 96 %.    ECOG: 0    General appearance: Comfortable appearing without any discomfort Head: Normocephalic without any trauma Oropharynx: Mucous membranes are moist and pink without any thrush or ulcers. Eyes: Pupils are equal and round reactive to light. Lymph nodes: No cervical, supraclavicular, inguinal or axillary lymphadenopathy.   Heart:regular rate and rhythm.  S1 and S2 without leg edema. Lung: Clear without any rhonchi or wheezes.  No dullness to percussion. Abdomin: Soft, nontender, nondistended with good bowel sounds.  No hepatosplenomegaly. Musculoskeletal: No joint deformity or effusion.  Full range of motion noted. Neurological: No deficits noted on motor, sensory and deep tendon reflex exam. Skin: No petechial rash or dryness.  Appeared moist.          .             Lab Results: Lab Results  Component Value Date   WBC 6.3 06/25/2020   HGB 13.7 06/25/2020   HCT 39.7 06/25/2020   MCV 91.7 06/25/2020   PLT 266 06/25/2020  Chemistry      Component Value Date/Time   NA 140 06/25/2020 0802   K 3.8 06/25/2020 0802   CL 105 06/25/2020 0802   CO2 23 06/25/2020 0802   BUN 23 06/25/2020 0802   CREATININE 0.80 06/25/2020 0802      Component Value Date/Time   CALCIUM 9.2 06/25/2020 0802   ALKPHOS 48 06/25/2020 0802   AST 18 06/25/2020 0802   ALT 12 06/25/2020 0802   BILITOT 1.2 06/25/2020 0802          Results for Hise, [name] (MRN [id number]) as of 06/27/2020 11:43  Ref. Range 02/13/2020 08:15 04/22/2020  07:49 06/25/2020 08:02  Prostate Specific Ag, Serum Latest Ref Range: 0.0 - 4.0 ng/mL 7.8 (H) 8.1 (H) 8.6 (H)             Impression and Plan:   70 year old man with:  1.    Advanced prostate cancer with disease to the bone and lymphadenopathy diagnosed in 2019.  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  He continues to be on Zytiga without any major complications.  His PSA remains under reasonable control with minor fluctuations.  Risks and benefits of continuing this treatment were discussed.  Potential complications long-term were reviewed including hypertension and adrenal insufficiency.  Alternative treatment options including Taxotere chemotherapy, Xofigo and potentially lutetium as it has been approved recently.  For the time being he is agreeable to continue.  2.  Androgen deprivation: He is status post orchiectomy and no additional androgen deprivation is needed.  3.  Hypertension: No issues with his blood pressure noted at this time.  We will continue to monitor on Zytiga.  4.    Bone directed therapy: He is currently on Xgeva and agrees to receive it.  Complications occluding hypocalcemia osteonecrosis of the jaw were reiterated.  5.  Prognosis and goals of care: His disease is incurable although aggressive measures are warranted given his excellent performance status.  6.  Hypocalcemia: His calcium is normal at this time continues to be on supplements.  7.  Hypokalemia: We will continue to monitor on Zytiga currently within normal range.  8.  Follow-up: In 2 months for repeat evaluation.  30 minutes were dedicated to this visit.  The time was spent on reviewing laboratory data, disease status update discussing treatment options for the future and answering questions regarding prognosis.   Zola Button, Ethan [name] 4/15/202211:42 AM

## 2020-06-30 ENCOUNTER — Encounter: Payer: Self-pay | Admitting: Oncology

## 2020-06-30 ENCOUNTER — Telehealth: Payer: Self-pay | Admitting: Oncology

## 2020-06-30 NOTE — Telephone Encounter (Signed)
Patient notified & confirmed appointments.

## 2020-07-07 ENCOUNTER — Other Ambulatory Visit: Payer: Self-pay | Admitting: Oncology

## 2020-07-07 ENCOUNTER — Other Ambulatory Visit (HOSPITAL_COMMUNITY): Payer: Self-pay

## 2020-07-07 DIAGNOSIS — C61 Malignant neoplasm of prostate: Secondary | ICD-10-CM

## 2020-07-07 MED ORDER — ABIRATERONE ACETATE 500 MG PO TABS
ORAL_TABLET | ORAL | 0 refills | Status: DC
  Filled 2020-07-07: qty 60, 30d supply, fill #0

## 2020-07-10 ENCOUNTER — Other Ambulatory Visit (HOSPITAL_COMMUNITY): Payer: Self-pay

## 2020-08-04 ENCOUNTER — Other Ambulatory Visit: Payer: Self-pay | Admitting: Oncology

## 2020-08-04 ENCOUNTER — Other Ambulatory Visit (HOSPITAL_COMMUNITY): Payer: Self-pay

## 2020-08-04 DIAGNOSIS — C61 Malignant neoplasm of prostate: Secondary | ICD-10-CM

## 2020-08-04 MED ORDER — ABIRATERONE ACETATE 500 MG PO TABS
ORAL_TABLET | ORAL | 0 refills | Status: DC
  Filled 2020-08-04: qty 60, 30d supply, fill #0

## 2020-08-05 ENCOUNTER — Other Ambulatory Visit (HOSPITAL_COMMUNITY): Payer: Self-pay

## 2020-08-07 ENCOUNTER — Other Ambulatory Visit (HOSPITAL_COMMUNITY): Payer: Self-pay

## 2020-08-07 DIAGNOSIS — L570 Actinic keratosis: Secondary | ICD-10-CM | POA: Diagnosis not present

## 2020-08-07 DIAGNOSIS — D225 Melanocytic nevi of trunk: Secondary | ICD-10-CM | POA: Diagnosis not present

## 2020-08-07 DIAGNOSIS — X32XXXA Exposure to sunlight, initial encounter: Secondary | ICD-10-CM | POA: Diagnosis not present

## 2020-08-07 DIAGNOSIS — L821 Other seborrheic keratosis: Secondary | ICD-10-CM | POA: Diagnosis not present

## 2020-08-09 ENCOUNTER — Other Ambulatory Visit: Payer: Self-pay | Admitting: Oncology

## 2020-08-12 ENCOUNTER — Encounter: Payer: Self-pay | Admitting: Oncology

## 2020-08-18 ENCOUNTER — Other Ambulatory Visit: Payer: Self-pay | Admitting: Oncology

## 2020-08-25 ENCOUNTER — Inpatient Hospital Stay: Payer: Medicare Other | Attending: Oncology

## 2020-08-25 ENCOUNTER — Other Ambulatory Visit: Payer: Self-pay

## 2020-08-25 ENCOUNTER — Encounter: Payer: Self-pay | Admitting: Oncology

## 2020-08-25 DIAGNOSIS — Z9079 Acquired absence of other genital organ(s): Secondary | ICD-10-CM | POA: Insufficient documentation

## 2020-08-25 DIAGNOSIS — Z79899 Other long term (current) drug therapy: Secondary | ICD-10-CM | POA: Insufficient documentation

## 2020-08-25 DIAGNOSIS — C7951 Secondary malignant neoplasm of bone: Secondary | ICD-10-CM | POA: Insufficient documentation

## 2020-08-25 DIAGNOSIS — C61 Malignant neoplasm of prostate: Secondary | ICD-10-CM | POA: Insufficient documentation

## 2020-08-25 DIAGNOSIS — E876 Hypokalemia: Secondary | ICD-10-CM | POA: Insufficient documentation

## 2020-08-25 LAB — CMP (CANCER CENTER ONLY)
ALT: 12 U/L (ref 0–44)
AST: 18 U/L (ref 15–41)
Albumin: 4.1 g/dL (ref 3.5–5.0)
Alkaline Phosphatase: 45 U/L (ref 38–126)
Anion gap: 9 (ref 5–15)
BUN: 22 mg/dL (ref 8–23)
CO2: 23 mmol/L (ref 22–32)
Calcium: 9 mg/dL (ref 8.9–10.3)
Chloride: 107 mmol/L (ref 98–111)
Creatinine: 0.8 mg/dL (ref 0.61–1.24)
GFR, Estimated: >60 mL/min (ref >60)
Glucose, Bld: 104 mg/dL — ABNORMAL HIGH (ref 70–99)
Potassium: 3.6 mmol/L (ref 3.5–5.1)
Sodium: 139 mmol/L (ref 135–145)
Total Bilirubin: 0.9 mg/dL (ref 0.3–1.2)
Total Protein: 6.6 g/dL (ref 6.5–8.1)

## 2020-08-25 LAB — CBC WITH DIFFERENTIAL (CANCER CENTER ONLY)
Abs Immature Granulocytes: 0.02 10*3/uL (ref 0.00–0.07)
Basophils Absolute: 0 10*3/uL (ref 0.0–0.1)
Basophils Relative: 0 %
Eosinophils Absolute: 0.3 10*3/uL (ref 0.0–0.5)
Eosinophils Relative: 5 %
HCT: 37.8 % — ABNORMAL LOW (ref 39.0–52.0)
Hemoglobin: 13 g/dL (ref 13.0–17.0)
Immature Granulocytes: 0 %
Lymphocytes Relative: 29 %
Lymphs Abs: 1.9 10*3/uL (ref 0.7–4.0)
MCH: 31.3 pg (ref 26.0–34.0)
MCHC: 34.4 g/dL (ref 30.0–36.0)
MCV: 91.1 fL (ref 80.0–100.0)
Monocytes Absolute: 0.6 10*3/uL (ref 0.1–1.0)
Monocytes Relative: 8 %
Neutro Abs: 3.8 10*3/uL (ref 1.7–7.7)
Neutrophils Relative %: 58 %
Platelet Count: 255 10*3/uL (ref 150–400)
RBC: 4.15 MIL/uL — ABNORMAL LOW (ref 4.22–5.81)
RDW: 11.8 % (ref 11.5–15.5)
WBC Count: 6.7 10*3/uL (ref 4.0–10.5)
nRBC: 0 % (ref 0.0–0.2)

## 2020-08-25 LAB — BASIC METABOLIC PANEL
BUN: 22 — AB (ref 4–21)
Creatinine: 0.8 (ref 0.6–1.3)

## 2020-08-25 LAB — PSA: PSA: 9

## 2020-08-26 ENCOUNTER — Other Ambulatory Visit: Payer: Medicare Other

## 2020-08-26 LAB — PROSTATE-SPECIFIC AG, SERUM (LABCORP): Prostate Specific Ag, Serum: 9 ng/mL — ABNORMAL HIGH (ref 0.0–4.0)

## 2020-08-28 ENCOUNTER — Other Ambulatory Visit: Payer: Self-pay

## 2020-08-28 ENCOUNTER — Ambulatory Visit: Payer: Medicare Other | Admitting: Oncology

## 2020-08-28 ENCOUNTER — Encounter: Payer: Self-pay | Admitting: Oncology

## 2020-08-28 ENCOUNTER — Ambulatory Visit: Payer: Medicare Other

## 2020-08-28 ENCOUNTER — Inpatient Hospital Stay (HOSPITAL_BASED_OUTPATIENT_CLINIC_OR_DEPARTMENT_OTHER): Payer: Medicare Other | Admitting: Oncology

## 2020-08-28 ENCOUNTER — Inpatient Hospital Stay: Payer: Medicare Other

## 2020-08-28 VITALS — BP 113/78 | HR 71 | Temp 97.6°F | Resp 16 | Ht 70.0 in | Wt 165.1 lb

## 2020-08-28 DIAGNOSIS — E876 Hypokalemia: Secondary | ICD-10-CM | POA: Diagnosis not present

## 2020-08-28 DIAGNOSIS — C61 Malignant neoplasm of prostate: Secondary | ICD-10-CM | POA: Diagnosis not present

## 2020-08-28 DIAGNOSIS — Z79899 Other long term (current) drug therapy: Secondary | ICD-10-CM | POA: Diagnosis not present

## 2020-08-28 DIAGNOSIS — C7951 Secondary malignant neoplasm of bone: Secondary | ICD-10-CM | POA: Diagnosis not present

## 2020-08-28 DIAGNOSIS — Z9079 Acquired absence of other genital organ(s): Secondary | ICD-10-CM | POA: Diagnosis not present

## 2020-08-28 NOTE — Progress Notes (Signed)
Hematology and Oncology Follow Up Visit  Rogue Rafalski [id number] 25-Mar-1950 70 y.o. 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  He was found to have PSA 542 and Gleason score of 9 at the time of diagnosis.  Prior Therapy:   He is status post orchiectomy completed by Dr. Jeffie Pollock on September 08, 2017.  Current therapy:  Zytiga 1000 mg daily with prednisone started in July 2019.  Xgeva on 120 mg every 8 weeks.  Therapy on hold because of dental issues.  Interim History: Mr. Ehly is here for a follow-up visit.  Since the last visit, he reports no major changes in his health.  He denies any recent hospitalization or illnesses.  He continues to tolerate Zytiga without any major complaints.  He denies any nausea, fatigue or edema.  He denies any bone pain or pathological fractures.  He continues to be active and exercise regularly.  Medications: Reviewed without changes. Current Outpatient Medications  Medication Sig Dispense Refill   abiraterone acetate (ZYTIGA) 500 MG tablet TAKE 2 TABLETS (1,000 MG TOTAL) BY MOUTH DAILY. TAKE ON AN EMPTY STOMACH 1 HOUR BEFORE OR 2 HOURS AFTER A MEAL. 60 tablet 0   Calcium Carb-Cholecalciferol (CALTRATE 600+D3 PO) Take 2 tablets by mouth daily.     hydrochlorothiazide (MICROZIDE) 12.5 MG capsule TAKE 1 CAPSULE BY MOUTH EVERY DAY 90 capsule 1   KLOR-CON M20 20 MEQ tablet TAKE 1 TABLET BY MOUTH EVERY DAY 90 tablet 1   Multiple Vitamin (MULTIVITAMIN) tablet Take 1 tablet by mouth daily.     predniSONE (DELTASONE) 5 MG tablet TAKE 1 TABLET BY MOUTH EVERY DAY WITH BREAKFAST 90 tablet 3   tamsulosin (FLOMAX) 0.4 MG CAPS capsule TAKE 1 CAPSULE (0.4 MG TOTAL) BY MOUTH DAILY AFTER SUPPER. 90 capsule 1   valsartan (DIOVAN) 80 MG tablet TAKE 1 TABLET BY MOUTH EVERY DAY 30 tablet 1   zolpidem (AMBIEN) 10  MG tablet Take 5 mg by mouth at bedtime as needed for sleep.  (Patient not taking: Reported on 06/27/2020)     No current facility-administered medications for this visit.     Allergies: No Known Allergies      Physical Exam:   Blood pressure 113/78, pulse 71, temperature 97.6 F (36.4 C), temperature source Tympanic, resp. rate 16, height 5\' 10"  (1.778 m), weight 165 lb 1.6 oz (74.9 kg), SpO2 100 %.    ECOG: 0   General appearance: Alert, awake without any distress. Head: Atraumatic without abnormalities Oropharynx: Without any thrush or ulcers. Eyes: No scleral icterus. Lymph nodes: No lymphadenopathy noted in the cervical, supraclavicular, or axillary nodes Heart:regular rate and rhythm, without any murmurs or gallops.   Lung: Clear to auscultation without any rhonchi, wheezes or dullness to percussion. Abdomin: Soft, nontender without any shifting dullness or ascites. Musculoskeletal: No clubbing or cyanosis. Neurological: No motor or sensory deficits. Skin: No rashes or lesions.          .             Lab Results: Lab Results  Component Value Date   WBC 6.7 08/25/2020   HGB 13.0 08/25/2020   HCT 37.8 (L) 08/25/2020   MCV 91.1 08/25/2020   PLT 255 08/25/2020     Chemistry      Component Value Date/Time   NA 139 08/25/2020 0814   K 3.6 08/25/2020 8101  CL 107 08/25/2020 0814   CO2 23 08/25/2020 0814   BUN 22 08/25/2020 0814   CREATININE 0.80 08/25/2020 0814      Component Value Date/Time   CALCIUM 9.0 08/25/2020 0814   ALKPHOS 45 08/25/2020 0814   AST 18 08/25/2020 0814   ALT 12 08/25/2020 0814   BILITOT 0.9 08/25/2020 0814            Results for KEAUNDRE, THELIN "[name]" (MRN [id number]) as of 08/28/2020 12:24  Ref. Range 04/22/2020 07:49 06/25/2020 08:02 08/25/2020 08:14  Prostate Specific Ag, Serum Latest Ref Range: 0.0 - 4.0 ng/mL 8.1 (H) 8.6 (H) 9.0 (H)             Impression and Plan:   70 year old man with:    1.    Castration-sensitive advanced prostate cancer with disease to the bone and lymphadenopathy diagnosed in 2019.    His disease status was updated at this time and treatment options were reviewed.  Laboratory data obtained on 08/25/2020 were reviewed and continues to show overall slow changes in his PSA.  PSA currently at 9.0 which has not dramatically changed in the last 2 years.  Risks and benefits of continuing Zytiga versus different salvage therapy options were discussed.  His options including systemic chemotherapy.  He is agreeable to continue with this plan.  We will continue to monitor PSA.  2.  Androgen deprivation: No need for any additional androgen deprivation.  He is status post orchiectomy.  Testosterone level will be repeated in the future.   3.  Hypertension:    4.    Bone directed therapy: He is currently on Xgeva currently on hold because of dental issues.  This will be reinstituted in the future.  5.  Prognosis and goals of care: Therapy remains palliative although aggressive measures are warranted given his reasonable performance status.  6.  Hypocalcemia: His calcium level is within normal range currently on appropriate calcium supplements.  7.  Hypokalemia: Related to Zytiga and currently within normal range.  8.  Follow-up: He will return in 2 months for a follow-up visit.   30 minutes were spent on this encounter.  Time was dedicated to reviewing laboratory data, disease status update, treatment choices and complications related to his cancer and cancer therapy.   Zola Button, MD 6/16/202212:22 PM

## 2020-09-01 ENCOUNTER — Other Ambulatory Visit (HOSPITAL_COMMUNITY): Payer: Self-pay

## 2020-09-01 ENCOUNTER — Other Ambulatory Visit: Payer: Self-pay | Admitting: Oncology

## 2020-09-01 DIAGNOSIS — C61 Malignant neoplasm of prostate: Secondary | ICD-10-CM

## 2020-09-01 MED ORDER — ABIRATERONE ACETATE 500 MG PO TABS
ORAL_TABLET | ORAL | 0 refills | Status: DC
  Filled 2020-09-08: qty 60, 30d supply, fill #0

## 2020-09-03 ENCOUNTER — Telehealth: Payer: Self-pay | Admitting: Oncology

## 2020-09-03 ENCOUNTER — Encounter: Payer: Self-pay | Admitting: Oncology

## 2020-09-03 NOTE — Telephone Encounter (Signed)
R/s appt time per 6/22 sch msg. Pt aware.

## 2020-09-08 ENCOUNTER — Other Ambulatory Visit (HOSPITAL_COMMUNITY): Payer: Self-pay

## 2020-09-18 ENCOUNTER — Ambulatory Visit (INDEPENDENT_AMBULATORY_CARE_PROVIDER_SITE_OTHER): Payer: Medicare Other

## 2020-09-18 ENCOUNTER — Ambulatory Visit (INDEPENDENT_AMBULATORY_CARE_PROVIDER_SITE_OTHER): Payer: Medicare Other | Admitting: Internal Medicine

## 2020-09-18 ENCOUNTER — Other Ambulatory Visit: Payer: Self-pay | Admitting: Internal Medicine

## 2020-09-18 ENCOUNTER — Encounter: Payer: Self-pay | Admitting: Internal Medicine

## 2020-09-18 ENCOUNTER — Other Ambulatory Visit: Payer: Self-pay

## 2020-09-18 VITALS — BP 116/70 | HR 66 | Temp 98.1°F | Ht 70.0 in | Wt 166.0 lb

## 2020-09-18 DIAGNOSIS — G8929 Other chronic pain: Secondary | ICD-10-CM | POA: Insufficient documentation

## 2020-09-18 DIAGNOSIS — D539 Nutritional anemia, unspecified: Secondary | ICD-10-CM | POA: Diagnosis not present

## 2020-09-18 DIAGNOSIS — M79672 Pain in left foot: Secondary | ICD-10-CM | POA: Diagnosis not present

## 2020-09-18 DIAGNOSIS — C61 Malignant neoplasm of prostate: Secondary | ICD-10-CM | POA: Diagnosis not present

## 2020-09-18 DIAGNOSIS — M7732 Calcaneal spur, left foot: Secondary | ICD-10-CM | POA: Diagnosis not present

## 2020-09-18 DIAGNOSIS — F5104 Psychophysiologic insomnia: Secondary | ICD-10-CM | POA: Diagnosis not present

## 2020-09-18 DIAGNOSIS — I10 Essential (primary) hypertension: Secondary | ICD-10-CM | POA: Insufficient documentation

## 2020-09-18 LAB — TSH: TSH: 2.1 u[IU]/mL (ref 0.35–5.50)

## 2020-09-18 LAB — FERRITIN: Ferritin: 31.9 ng/mL (ref 22.0–322.0)

## 2020-09-18 LAB — IRON: Iron: 207 ug/dL — ABNORMAL HIGH (ref 42–165)

## 2020-09-18 LAB — FOLATE: Folate: >24.4 ng/mL (ref >5.9)

## 2020-09-18 LAB — VITAMIN B12: Vitamin B-12: 606 pg/mL (ref 211–911)

## 2020-09-18 MED ORDER — ZOLPIDEM TARTRATE 10 MG PO TABS: 5.0000 mg | ORAL_TABLET | Freq: Every evening | ORAL | 1 refills | Status: DC | PRN

## 2020-09-18 NOTE — Progress Notes (Signed)
Subjective:  Patient ID: Ethan [name], male    DOB: Dec 23, 1950  Age: 70 y.o. MRN: [id number]  CC: Anemia and Hypertension  This visit occurred during the SARS-CoV-2 public health emergency.  Safety protocols were in place, including screening questions prior to the visit, additional usage of staff PPE, and extensive cleaning of exam room while observing appropriate contact time as indicated for disinfecting solutions.    HPI [name] presents for f/up and to establish.  He walks a little more than 5 miles a day.  He denies CP, DOE, palpitations, edema, or fatigue.  He complains of a 2-week history of nontraumatic pain over the plantar side of his left heel.  He has been icing it without much improvement in his symptoms.  History Becky has a past medical history of Hypertension, Nodular prostate with lower urinary tract symptoms, Prostate cancer metastatic to multiple sites [facility]) (urologist-  dr Jeffie Pollock), and Wears glasses.   He has a past surgical history that includes Appendectomy (1963); Colonoscopy with propofol (2017); Orchiectomy (Bilateral, 09/08/2017); and ORIF humerus fracture (Left, 05/12/2018).   His family history includes Alcoholism in his father; Congestive Heart Failure in his brother; Dementia in his mother; Heart attack in his paternal grandfather; Kidney failure in his father; Lung cancer in his maternal aunt, maternal grandmother, maternal uncle, and paternal uncle.He reports that he quit smoking about 14 years ago. His smoking use included cigarettes. He has never used smokeless tobacco. He reports previous alcohol use. He reports that he does not use drugs.  Outpatient Medications Prior to Visit  Medication Sig Dispense Refill   abiraterone acetate (ZYTIGA) 500 MG tablet TAKE 2 TABLETS (1,000 MG TOTAL) BY MOUTH DAILY. TAKE ON AN EMPTY STOMACH 1 HOUR BEFORE OR 2 HOURS AFTER A MEAL. 60 tablet 0   Calcium Carb-Cholecalciferol (CALTRATE 600+D3 PO) Take 2 tablets by mouth  daily.     hydrochlorothiazide (MICROZIDE) 12.5 MG capsule TAKE 1 CAPSULE BY MOUTH EVERY DAY 90 capsule 1   KLOR-CON M20 20 MEQ tablet TAKE 1 TABLET BY MOUTH EVERY DAY 90 tablet 1   Multiple Vitamin (MULTIVITAMIN) tablet Take 1 tablet by mouth daily.     predniSONE (DELTASONE) 5 MG tablet TAKE 1 TABLET BY MOUTH EVERY DAY WITH BREAKFAST 90 tablet 3   tamsulosin (FLOMAX) 0.4 MG CAPS capsule TAKE 1 CAPSULE (0.4 MG TOTAL) BY MOUTH DAILY AFTER SUPPER. 90 capsule 1   valsartan (DIOVAN) 80 MG tablet TAKE 1 TABLET BY MOUTH EVERY DAY 30 tablet 1   zolpidem (AMBIEN) 10 MG tablet Take 5 mg by mouth at bedtime as needed for sleep.     No facility-administered medications prior to visit.    ROS Review of Systems  Constitutional: Negative.  Negative for appetite change, diaphoresis, fatigue and unexpected weight change.  HENT: Negative.    Eyes: Negative.   Respiratory:  Negative for cough, chest tightness, shortness of breath and wheezing.   Cardiovascular:  Negative for chest pain, palpitations and leg swelling.  Gastrointestinal:  Negative for abdominal pain, constipation, diarrhea, nausea and vomiting.  Endocrine: Negative.   Musculoskeletal:  Positive for arthralgias. Negative for myalgias.  Skin: Negative.  Negative for color change.  Allergic/Immunologic: Negative.   Neurological: Negative.  Negative for dizziness.  Hematological:  Negative for adenopathy. Does not bruise/bleed easily.  Psychiatric/Behavioral:  Positive for sleep disturbance. Negative for decreased concentration. The patient is not nervous/anxious.    Objective:  BP 116/70 (BP Location: Left Arm, Patient Position: Sitting, Cuff Size: Large)  Pulse 66   Temp 98.1 F (36.7 C) (Oral)   Ht 5\' 10"  (1.778 m)   Wt 166 lb (75.3 kg)   SpO2 99%   BMI 23.82 kg/m   Physical Exam Vitals reviewed.  Constitutional:      Appearance: Normal appearance.  HENT:     Nose: Nose normal.     Mouth/Throat:     Mouth: Mucous membranes  are moist.  Eyes:     General: No scleral icterus.    Conjunctiva/sclera: Conjunctivae normal.  Cardiovascular:     Rate and Rhythm: Normal rate and regular rhythm.     Heart sounds: Normal heart sounds, S1 normal and S2 normal. No murmur heard.   No gallop.     Comments: EKG- NSR, 65 bpm Normal EKG Pulmonary:     Effort: Pulmonary effort is normal.     Breath sounds: No stridor. No wheezing, rhonchi or rales.  Abdominal:     General: Abdomen is flat. Bowel sounds are normal. There is no distension.     Palpations: Abdomen is soft. There is no hepatomegaly, splenomegaly or mass.  Musculoskeletal:     Cervical back: Neck supple.     Right lower leg: No edema.     Left lower leg: No edema.     Right foot: Normal range of motion.     Left foot: Normal range of motion.       Feet:  Feet:     Right foot:     Skin integrity: Skin integrity normal.     Left foot:     Skin integrity: Skin integrity normal.  Lymphadenopathy:     Cervical: No cervical adenopathy.  Neurological:     Mental Status: He is alert.    Lab Results  Component Value Date   WBC 6.7 08/25/2020   HGB 13.0 08/25/2020   HCT 37.8 (L) 08/25/2020   PLT 255 08/25/2020   GLUCOSE 104 (H) 08/25/2020   CHOL 157 02/13/2020   TRIG 71 02/13/2020   HDL 53 02/13/2020   LDLCALC 90 02/13/2020   ALT 12 08/25/2020   AST 18 08/25/2020   NA 139 08/25/2020   K 3.6 08/25/2020   CL 107 08/25/2020   CREATININE 0.80 08/25/2020   BUN 22 08/25/2020   CO2 23 08/25/2020   TSH 2.10 09/18/2020   PSA 9.00 08/25/2020   INR 1.0 05/12/2018    DG Os Calcis Left  Result Date: 09/18/2020 CLINICAL DATA:  Left heel pain for 2 weeks. EXAM: LEFT OS CALCIS - 2+ VIEW COMPARISON:  None. FINDINGS: Cortical margins of the calcaneus are intact. There is a small plantar calcaneal spur. Small fragmented Achilles tendon enthesophyte. Normal subtalar alignment. Regional soft tissues are normal. IMPRESSION: Small plantar calcaneal spur. Small  fragmented Achilles tendon enthesophyte. Electronically Signed   By: Keith Rake M.D.   On: 09/18/2020 23:58     Assessment & Plan:   Lacey was seen today for anemia and hypertension.  Diagnoses and all orders for this visit:  Prostate cancer [facility])- This is being treated.  Deficiency anemia- I will screen him for vit deficiencies. -     Vitamin B12; Future -     Iron; Future -     Folate; Future -     Ferritin; Future -     Vitamin B1; Future -     Vitamin B1 -     Ferritin -     Folate -     Iron -  Vitamin B12  Psychophysiological insomnia -     zolpidem (AMBIEN) 10 MG tablet; Take 0.5 tablets (5 mg total) by mouth at bedtime as needed for sleep.  Primary hypertension- His BP is well controlled. -     TSH; Future -     EKG 12-Lead -     TSH  Chronic heel pain, left -     DG Os Calcis Left; Future -     Ambulatory referral to Podiatry  Heel spur, left -     Ambulatory referral to Podiatry  I have changed Teola Bradley "[name]"'s zolpidem. I am also having him maintain his multivitamin, Calcium Carb-Cholecalciferol (CALTRATE 600+D3 PO), hydrochlorothiazide, Klor-Con M20, valsartan, tamsulosin, predniSONE, and abiraterone acetate.  Meds ordered this encounter  Medications   zolpidem (AMBIEN) 10 MG tablet    Sig: Take 0.5 tablets (5 mg total) by mouth at bedtime as needed for sleep.    Dispense:  30 tablet    Refill:  1     Follow-up: Return in about 6 months (around 03/21/2021).  Scarlette Calico, MD

## 2020-09-18 NOTE — Patient Instructions (Signed)

## 2020-09-19 ENCOUNTER — Encounter: Payer: Self-pay | Admitting: Internal Medicine

## 2020-09-19 DIAGNOSIS — M7732 Calcaneal spur, left foot: Secondary | ICD-10-CM | POA: Insufficient documentation

## 2020-09-21 ENCOUNTER — Encounter: Payer: Self-pay | Admitting: Internal Medicine

## 2020-09-21 LAB — VITAMIN B1: Vitamin B1 (Thiamine): 30 nmol/L (ref 8–30)

## 2020-09-26 ENCOUNTER — Other Ambulatory Visit: Payer: Self-pay | Admitting: Oncology

## 2020-09-27 ENCOUNTER — Other Ambulatory Visit: Payer: Self-pay | Admitting: Oncology

## 2020-09-29 ENCOUNTER — Encounter: Payer: Self-pay | Admitting: Oncology

## 2020-09-30 ENCOUNTER — Other Ambulatory Visit: Payer: Self-pay | Admitting: Oncology

## 2020-09-30 DIAGNOSIS — C61 Malignant neoplasm of prostate: Secondary | ICD-10-CM

## 2020-10-03 ENCOUNTER — Other Ambulatory Visit: Payer: Self-pay | Admitting: Oncology

## 2020-10-03 ENCOUNTER — Other Ambulatory Visit (HOSPITAL_COMMUNITY): Payer: Self-pay

## 2020-10-03 DIAGNOSIS — C61 Malignant neoplasm of prostate: Secondary | ICD-10-CM

## 2020-10-03 MED ORDER — ABIRATERONE ACETATE 500 MG PO TABS
ORAL_TABLET | ORAL | 0 refills | Status: DC
  Filled 2020-10-03: qty 60, 30d supply, fill #0

## 2020-10-06 ENCOUNTER — Ambulatory Visit: Payer: Medicare Other | Admitting: Podiatry

## 2020-10-07 ENCOUNTER — Other Ambulatory Visit (HOSPITAL_COMMUNITY): Payer: Self-pay

## 2020-10-07 ENCOUNTER — Other Ambulatory Visit: Payer: Self-pay

## 2020-10-07 ENCOUNTER — Ambulatory Visit (INDEPENDENT_AMBULATORY_CARE_PROVIDER_SITE_OTHER): Payer: Medicare Other | Admitting: Podiatry

## 2020-10-07 ENCOUNTER — Encounter: Payer: Self-pay | Admitting: Oncology

## 2020-10-07 DIAGNOSIS — M216X9 Other acquired deformities of unspecified foot: Secondary | ICD-10-CM | POA: Diagnosis not present

## 2020-10-07 DIAGNOSIS — M722 Plantar fascial fibromatosis: Secondary | ICD-10-CM | POA: Diagnosis not present

## 2020-10-07 DIAGNOSIS — M7732 Calcaneal spur, left foot: Secondary | ICD-10-CM | POA: Diagnosis not present

## 2020-10-07 MED ORDER — BETAMETHASONE SOD PHOS & ACET 6 (3-3) MG/ML IJ SUSP: 6.0000 mg | Freq: Once | INTRAMUSCULAR | Status: AC

## 2020-10-07 NOTE — Progress Notes (Signed)
  Subjective:  Patient ID: Ethan [name], male    DOB: 1950-07-12,  MRN: [id number]  Chief Complaint  Patient presents with   Heel Spurs    Left foot heel spur. Pt saw PCP where he was diagnosed with spur.    70 y.o. male presents with the above complaint. History confirmed with patient.   Objective:  Physical Exam: warm, good capillary refill, no trophic changes or ulcerative lesions, normal DP and PT pulses, and normal sensory exam. Left Foot: tenderness to palpation medial calcaneal tuber, no pain with calcaneal squeeze, decreased ankle joint ROM, and +Silverskiold test   09/18/20 Xrays: Study Result  Narrative & Impression  CLINICAL DATA:  Left heel pain for 2 weeks.   EXAM: LEFT OS CALCIS - 2+ VIEW   COMPARISON:  None.   FINDINGS: Cortical margins of the calcaneus are intact. There is a small plantar calcaneal spur. Small fragmented Achilles tendon enthesophyte. Normal subtalar alignment. Regional soft tissues are normal.   IMPRESSION: Small plantar calcaneal spur. Small fragmented Achilles tendon enthesophyte.     Electronically Signed   By: Keith Rake M.D.   On: 09/18/2020 23:58     Assessment:   1. Plantar fasciitis   2. Equinus deformity of foot   3. Calcaneal spur of left foot    Plan:  Patient was evaluated and treated and all questions answered.  Plantar Fasciitis -XR reviewed with patient -Educated patient on stretching and icing of the affected limb -Plantar fascial brace dispensed -Injection delivered to the plantar fascia of the left foot.  Procedure: Injection Tendon/Ligament Consent: Verbal consent obtained. Location: Left plantar fascia at the glabrous junction; medial approach. Skin Prep: Alcohol. Injectate: 1 cc 0.5% marcaine plain, 1 cc betamethasone acetate-betamethasone sodium phosphate Disposition: Patient tolerated procedure well. Injection site dressed with a band-aid.  Return in about 1 month (around 11/07/2020) for  Plantar fasciitis.

## 2020-10-08 ENCOUNTER — Telehealth: Payer: Self-pay

## 2020-10-08 ENCOUNTER — Other Ambulatory Visit (HOSPITAL_COMMUNITY): Payer: Self-pay

## 2020-10-08 ENCOUNTER — Encounter: Payer: Self-pay | Admitting: Oncology

## 2020-10-08 NOTE — Telephone Encounter (Signed)
Oral Oncology Patient Advocate Encounter  Was successful in securing patient a $8000 grant from [employer] to provide copayment coverage for Zytiga.  This will keep the out of pocket expense at $0.     [employer] ID: [id number]  I have spoken with the patient.   The billing information is as follows and has been shared with [place]: [id number] PCN: [id number] Member ID: [id number] Group ID: [id number] Dates of Eligibility: 09/07/20 through 09/06/21  Fund:  [facility] [place] Phone [phone] Fax [phone] 10/08/2020 9:02 AM

## 2020-10-23 ENCOUNTER — Encounter: Payer: Self-pay | Admitting: Oncology

## 2020-11-03 ENCOUNTER — Other Ambulatory Visit: Payer: Self-pay | Admitting: Oncology

## 2020-11-03 ENCOUNTER — Other Ambulatory Visit (HOSPITAL_COMMUNITY): Payer: Self-pay

## 2020-11-03 DIAGNOSIS — C61 Malignant neoplasm of prostate: Secondary | ICD-10-CM

## 2020-11-03 MED ORDER — ABIRATERONE ACETATE 500 MG PO TABS
ORAL_TABLET | ORAL | 0 refills | Status: DC
  Filled 2020-11-10: qty 60, 30d supply, fill #0

## 2020-11-04 ENCOUNTER — Other Ambulatory Visit: Payer: Self-pay

## 2020-11-04 ENCOUNTER — Inpatient Hospital Stay: Payer: Medicare Other | Attending: Oncology

## 2020-11-04 DIAGNOSIS — Z79899 Other long term (current) drug therapy: Secondary | ICD-10-CM | POA: Insufficient documentation

## 2020-11-04 DIAGNOSIS — C7951 Secondary malignant neoplasm of bone: Secondary | ICD-10-CM | POA: Diagnosis not present

## 2020-11-04 DIAGNOSIS — C61 Malignant neoplasm of prostate: Secondary | ICD-10-CM | POA: Insufficient documentation

## 2020-11-04 DIAGNOSIS — Z9079 Acquired absence of other genital organ(s): Secondary | ICD-10-CM | POA: Insufficient documentation

## 2020-11-04 LAB — CBC WITH DIFFERENTIAL (CANCER CENTER ONLY)
Abs Immature Granulocytes: 0.01 10*3/uL (ref 0.00–0.07)
Basophils Absolute: 0 10*3/uL (ref 0.0–0.1)
Basophils Relative: 1 %
Eosinophils Absolute: 0.3 10*3/uL (ref 0.0–0.5)
Eosinophils Relative: 5 %
HCT: 41.4 % (ref 39.0–52.0)
Hemoglobin: 14.1 g/dL (ref 13.0–17.0)
Immature Granulocytes: 0 %
Lymphocytes Relative: 29 %
Lymphs Abs: 1.9 10*3/uL (ref 0.7–4.0)
MCH: 31.2 pg (ref 26.0–34.0)
MCHC: 34.1 g/dL (ref 30.0–36.0)
MCV: 91.6 fL (ref 80.0–100.0)
Monocytes Absolute: 0.5 10*3/uL (ref 0.1–1.0)
Monocytes Relative: 8 %
Neutro Abs: 3.8 10*3/uL (ref 1.7–7.7)
Neutrophils Relative %: 57 %
Platelet Count: 284 10*3/uL (ref 150–400)
RBC: 4.52 MIL/uL (ref 4.22–5.81)
RDW: 11.9 % (ref 11.5–15.5)
WBC Count: 6.6 10*3/uL (ref 4.0–10.5)
nRBC: 0 % (ref 0.0–0.2)

## 2020-11-04 LAB — CMP (CANCER CENTER ONLY)
ALT: 15 U/L (ref 0–44)
AST: 19 U/L (ref 15–41)
Albumin: 4.5 g/dL (ref 3.5–5.0)
Alkaline Phosphatase: 51 U/L (ref 38–126)
Anion gap: 12 (ref 5–15)
BUN: 16 mg/dL (ref 8–23)
CO2: 23 mmol/L (ref 22–32)
Calcium: 9.6 mg/dL (ref 8.9–10.3)
Chloride: 105 mmol/L (ref 98–111)
Creatinine: 0.84 mg/dL (ref 0.61–1.24)
GFR, Estimated: >60 mL/min (ref >60)
Glucose, Bld: 108 mg/dL — ABNORMAL HIGH (ref 70–99)
Potassium: 3.7 mmol/L (ref 3.5–5.1)
Sodium: 140 mmol/L (ref 135–145)
Total Bilirubin: 0.9 mg/dL (ref 0.3–1.2)
Total Protein: 7.2 g/dL (ref 6.5–8.1)

## 2020-11-05 LAB — PROSTATE-SPECIFIC AG, SERUM (LABCORP): Prostate Specific Ag, Serum: 7.3 ng/mL — ABNORMAL HIGH (ref 0.0–4.0)

## 2020-11-06 ENCOUNTER — Other Ambulatory Visit: Payer: Self-pay

## 2020-11-06 ENCOUNTER — Inpatient Hospital Stay: Payer: Medicare Other

## 2020-11-06 ENCOUNTER — Encounter: Payer: Self-pay | Admitting: Podiatry

## 2020-11-06 ENCOUNTER — Ambulatory Visit: Payer: Medicare Other

## 2020-11-06 ENCOUNTER — Inpatient Hospital Stay (HOSPITAL_BASED_OUTPATIENT_CLINIC_OR_DEPARTMENT_OTHER): Payer: Medicare Other | Admitting: Oncology

## 2020-11-06 ENCOUNTER — Ambulatory Visit: Payer: Medicare Other | Admitting: Oncology

## 2020-11-06 VITALS — BP 118/65 | HR 88 | Temp 97.6°F | Resp 18 | Wt 165.0 lb

## 2020-11-06 DIAGNOSIS — Z79899 Other long term (current) drug therapy: Secondary | ICD-10-CM | POA: Diagnosis not present

## 2020-11-06 DIAGNOSIS — C61 Malignant neoplasm of prostate: Secondary | ICD-10-CM | POA: Diagnosis not present

## 2020-11-06 DIAGNOSIS — C7951 Secondary malignant neoplasm of bone: Secondary | ICD-10-CM | POA: Diagnosis not present

## 2020-11-06 DIAGNOSIS — Z9079 Acquired absence of other genital organ(s): Secondary | ICD-10-CM | POA: Diagnosis not present

## 2020-11-06 MED ORDER — DENOSUMAB 120 MG/1.7ML ~~LOC~~ SOLN
120.0000 mg | Freq: Once | SUBCUTANEOUS | Status: AC
  Administered 2020-11-06: 120 mg via SUBCUTANEOUS
  Filled 2020-11-06: qty 1.7

## 2020-11-06 NOTE — Progress Notes (Signed)
Hematology and Oncology Follow Up Visit  Ethan [name] [id number] Sep 24, 1950 70 y.o. 11/06/2020 2:26 PM Ethan [name], MDRankins, Ethan Salinas, MD   Principle Diagnosis: 70 year old man with advanced prostate cancer presented with PSA 542 and Gleason score of 9 at the in 2019.  He has castration-sensitive disease.  Prior Therapy:   He is status post orchiectomy completed by Dr. Jeffie [name] on September 08, 2017.  Current therapy:  Zytiga 1000 mg daily with prednisone started in July 2019.  Xgeva on 120 mg every 8 weeks.  Therapy will continue to be withheld because of dental considerations.  Interim History: Ethan [name] is here for repeat evaluation.  Since last visit, he reports feeling well without any major complaints.  He denies any nausea, vomiting or abdominal pain.  He denies any complications related to Zytiga.  He denies any hospitalization or illnesses.  Denies any worsening bone pain or pathological fractures.  He continues to be active exercising regularly.  Medications: Updated on review. Current Outpatient Medications  Medication Sig Dispense Refill   abiraterone acetate (ZYTIGA) 500 MG tablet TAKE 2 TABLETS (1,000 MG TOTAL) BY MOUTH DAILY. TAKE ON AN EMPTY STOMACH 1 HOUR BEFORE OR 2 HOURS AFTER A MEAL. 60 tablet 0   Calcium Carb-Cholecalciferol (CALTRATE 600+D3 PO) Take 2 tablets by mouth daily.     hydrochlorothiazide (MICROZIDE) 12.5 MG capsule TAKE 1 CAPSULE BY MOUTH EVERY DAY 90 capsule 1   KLOR-CON M20 20 MEQ tablet TAKE 1 TABLET BY MOUTH EVERY DAY 90 tablet 1   Multiple Vitamin (MULTIVITAMIN) tablet Take 1 tablet by mouth daily.     predniSONE (DELTASONE) 5 MG tablet TAKE 1 TABLET BY MOUTH EVERY DAY WITH BREAKFAST 90 tablet 3   tamsulosin (FLOMAX) 0.4 MG CAPS capsule TAKE 1 CAPSULE (0.4 MG TOTAL) BY MOUTH DAILY AFTER SUPPER. 90 capsule 1   valsartan (DIOVAN) 80 MG tablet TAKE 1 TABLET BY MOUTH EVERY DAY 30 tablet 1   zolpidem (AMBIEN) 10 MG tablet Take 0.5 tablets (5 mg total)  by mouth at bedtime as needed for sleep. 30 tablet 1   Current Facility-Administered Medications  Medication Dose Route Frequency Provider Last Rate Last Admin   betamethasone acetate-betamethasone sodium phosphate (CELESTONE) injection 6 mg  6 mg Other Once Ethan [name], DPM         Allergies: No Known Allergies      Physical Exam:   Blood pressure 118/65, pulse 88, temperature 97.6 F (36.4 C), temperature source Oral, resp. rate 18, weight 165 lb (74.8 kg), SpO2 98 %.     ECOG: 0   General appearance: Comfortable appearing without any discomfort Head: Normocephalic without any trauma Oropharynx: Mucous membranes are moist and pink without any thrush or ulcers. Eyes: Pupils are equal and round reactive to light. Lymph nodes: No cervical, supraclavicular, inguinal or axillary lymphadenopathy.   Heart:regular rate and rhythm.  S1 and S2 without leg edema. Lung: Clear without any rhonchi or wheezes.  No dullness to percussion. Abdomin: Soft, nontender, nondistended with good bowel sounds.  No hepatosplenomegaly. Musculoskeletal: No joint deformity or effusion.  Full range of motion noted. Neurological: No deficits noted on motor, sensory and deep tendon reflex exam. Skin: No petechial rash or dryness.  Appeared moist.           .             Lab Results: Lab Results  Component Value Date   WBC 6.6 11/04/2020   HGB 14.1 11/04/2020   HCT  41.4 11/04/2020   MCV 91.6 11/04/2020   PLT 284 11/04/2020     Chemistry      Component Value Date/Time   NA 140 11/04/2020 0748   K 3.7 11/04/2020 0748   CL 105 11/04/2020 0748   CO2 23 11/04/2020 0748   BUN 16 11/04/2020 0748   BUN 22 (A) 08/25/2020 0000   CREATININE 0.84 11/04/2020 0748      Component Value Date/Time   CALCIUM 9.6 11/04/2020 0748   ALKPHOS 51 11/04/2020 0748   AST 19 11/04/2020 0748   ALT 15 11/04/2020 0748   BILITOT 0.9 11/04/2020 0748           Results for Ethan [name], [name] (MRN [id number]) as of 11/06/2020 14:28  Ref. Range 08/25/2020 08:14 11/04/2020 07:48  Prostate Specific Ag, Serum Latest Ref Range: 0.0 - 4.0 ng/mL 9.0 (H) 7.3 (H)               Impression and Plan:   70 year old man with:   1.    Advanced prostate cancer with disease to the bone and lymphadenopathy diagnosed in 2019.  He has cstration-sensitive disease at this time.  His disease status was updated at this time and treatment choices were discussed.  He continues to be on Zytiga with a reasonable PSA response at this time.  Risks and benefits of continuing this treatment were discussed at this time.  Complication clinic hypertension, weight gain and adrenal insufficiency were reiterated.  Alternative treatment options including systemic chemotherapy remains as a salvage choice in the future.  2.  Androgen deprivation: He is status post orchiectomy without any additional androgen deprivation needed.   3.  Hypertension: No issues reported with his blood pressure at this time on Zytiga.   4.    Bone directed therapy: Risks and benefits of restarting Xgeva were discussed at this time.  Complications including osteonecrosis of the jaw and hypocalcemia were reiterated.  He underwent dental clearance recently and ready to proceed.  5.  Prognosis and goals of care: His disease is incurable although aggressive measures are warranted given his excellent pulm status.  6.  Hypocalcemia: Resolved at this time with the appropriate supplement.  7.  Hypokalemia: We will continue to monitor with Zytiga.  His potassium is within normal range.  8.  Follow-up: in 2 months for repeat evaluation.   30 minutes were dedicated to this visit.  Time was spent on reviewing laboratory data, discussing treatment choices and addressing complications related to his cancer and cancer therapy.   Zola Button, MD 8/25/20222:26 PM

## 2020-11-10 ENCOUNTER — Other Ambulatory Visit (HOSPITAL_COMMUNITY): Payer: Self-pay

## 2020-11-11 ENCOUNTER — Ambulatory Visit: Payer: Medicare Other | Admitting: Podiatry

## 2020-11-11 ENCOUNTER — Encounter: Payer: Self-pay | Admitting: Oncology

## 2020-11-12 ENCOUNTER — Telehealth: Payer: Self-pay | Admitting: Oncology

## 2020-11-12 NOTE — Telephone Encounter (Signed)
R/s times of appts per patient request. Called and spoke with patient. Confirmed

## 2020-11-17 DIAGNOSIS — Z20822 Contact with and (suspected) exposure to covid-19: Secondary | ICD-10-CM | POA: Diagnosis not present

## 2020-11-18 ENCOUNTER — Other Ambulatory Visit: Payer: Self-pay | Admitting: Oncology

## 2020-11-18 DIAGNOSIS — C61 Malignant neoplasm of prostate: Secondary | ICD-10-CM

## 2020-11-19 ENCOUNTER — Other Ambulatory Visit: Payer: Self-pay | Admitting: Oncology

## 2020-12-01 ENCOUNTER — Encounter: Payer: Self-pay | Admitting: Oncology

## 2020-12-02 ENCOUNTER — Other Ambulatory Visit (HOSPITAL_COMMUNITY): Payer: Self-pay

## 2020-12-02 ENCOUNTER — Other Ambulatory Visit: Payer: Self-pay | Admitting: Oncology

## 2020-12-02 DIAGNOSIS — C61 Malignant neoplasm of prostate: Secondary | ICD-10-CM

## 2020-12-02 MED ORDER — ABIRATERONE ACETATE 500 MG PO TABS
ORAL_TABLET | ORAL | 0 refills | Status: DC
  Filled 2020-12-02: qty 60, 30d supply, fill #0

## 2020-12-04 DIAGNOSIS — Z23 Encounter for immunization: Secondary | ICD-10-CM | POA: Diagnosis not present

## 2020-12-05 ENCOUNTER — Encounter: Payer: Self-pay | Admitting: Podiatry

## 2020-12-08 ENCOUNTER — Encounter: Payer: Self-pay | Admitting: Podiatry

## 2020-12-08 ENCOUNTER — Other Ambulatory Visit (HOSPITAL_COMMUNITY): Payer: Self-pay

## 2020-12-11 ENCOUNTER — Other Ambulatory Visit: Payer: Self-pay | Admitting: Oncology

## 2020-12-11 DIAGNOSIS — C61 Malignant neoplasm of prostate: Secondary | ICD-10-CM

## 2021-01-01 ENCOUNTER — Other Ambulatory Visit (HOSPITAL_COMMUNITY): Payer: Self-pay

## 2021-01-01 ENCOUNTER — Other Ambulatory Visit: Payer: Self-pay | Admitting: Oncology

## 2021-01-01 DIAGNOSIS — C61 Malignant neoplasm of prostate: Secondary | ICD-10-CM

## 2021-01-01 MED ORDER — ABIRATERONE ACETATE 500 MG PO TABS
ORAL_TABLET | ORAL | 0 refills | Status: DC
  Filled 2021-01-01: qty 60, 30d supply, fill #0

## 2021-01-05 ENCOUNTER — Other Ambulatory Visit (HOSPITAL_COMMUNITY): Payer: Self-pay

## 2021-01-06 ENCOUNTER — Other Ambulatory Visit: Payer: Self-pay | Admitting: Oncology

## 2021-01-06 ENCOUNTER — Other Ambulatory Visit: Payer: Medicare Other

## 2021-01-06 ENCOUNTER — Inpatient Hospital Stay: Payer: Medicare Other | Attending: Oncology

## 2021-01-06 ENCOUNTER — Other Ambulatory Visit: Payer: Self-pay

## 2021-01-06 DIAGNOSIS — Z9079 Acquired absence of other genital organ(s): Secondary | ICD-10-CM | POA: Insufficient documentation

## 2021-01-06 DIAGNOSIS — Z7952 Long term (current) use of systemic steroids: Secondary | ICD-10-CM | POA: Diagnosis not present

## 2021-01-06 DIAGNOSIS — Z79899 Other long term (current) drug therapy: Secondary | ICD-10-CM | POA: Insufficient documentation

## 2021-01-06 DIAGNOSIS — E876 Hypokalemia: Secondary | ICD-10-CM | POA: Diagnosis not present

## 2021-01-06 DIAGNOSIS — C7951 Secondary malignant neoplasm of bone: Secondary | ICD-10-CM | POA: Diagnosis not present

## 2021-01-06 DIAGNOSIS — C61 Malignant neoplasm of prostate: Secondary | ICD-10-CM

## 2021-01-06 DIAGNOSIS — I10 Essential (primary) hypertension: Secondary | ICD-10-CM | POA: Diagnosis not present

## 2021-01-06 LAB — CBC WITH DIFFERENTIAL (CANCER CENTER ONLY)
Abs Immature Granulocytes: 0.01 10*3/uL (ref 0.00–0.07)
Basophils Absolute: 0 10*3/uL (ref 0.0–0.1)
Basophils Relative: 1 %
Eosinophils Absolute: 0.2 10*3/uL (ref 0.0–0.5)
Eosinophils Relative: 3 %
HCT: 40.4 % (ref 39.0–52.0)
Hemoglobin: 13.9 g/dL (ref 13.0–17.0)
Immature Granulocytes: 0 %
Lymphocytes Relative: 31 %
Lymphs Abs: 1.9 10*3/uL (ref 0.7–4.0)
MCH: 31.3 pg (ref 26.0–34.0)
MCHC: 34.4 g/dL (ref 30.0–36.0)
MCV: 91 fL (ref 80.0–100.0)
Monocytes Absolute: 0.6 10*3/uL (ref 0.1–1.0)
Monocytes Relative: 10 %
Neutro Abs: 3.4 10*3/uL (ref 1.7–7.7)
Neutrophils Relative %: 55 %
Platelet Count: 308 10*3/uL (ref 150–400)
RBC: 4.44 MIL/uL (ref 4.22–5.81)
RDW: 11.8 % (ref 11.5–15.5)
WBC Count: 6.1 10*3/uL (ref 4.0–10.5)
nRBC: 0 % (ref 0.0–0.2)

## 2021-01-06 LAB — CMP (CANCER CENTER ONLY)
ALT: 14 U/L (ref 0–44)
AST: 18 U/L (ref 15–41)
Albumin: 4.4 g/dL (ref 3.5–5.0)
Alkaline Phosphatase: 53 U/L (ref 38–126)
Anion gap: 9 (ref 5–15)
BUN: 23 mg/dL (ref 8–23)
CO2: 25 mmol/L (ref 22–32)
Calcium: 9.3 mg/dL (ref 8.9–10.3)
Chloride: 105 mmol/L (ref 98–111)
Creatinine: 0.8 mg/dL (ref 0.61–1.24)
GFR, Estimated: >60 mL/min (ref >60)
Glucose, Bld: 107 mg/dL — ABNORMAL HIGH (ref 70–99)
Potassium: 3.9 mmol/L (ref 3.5–5.1)
Sodium: 139 mmol/L (ref 135–145)
Total Bilirubin: 0.9 mg/dL (ref 0.3–1.2)
Total Protein: 7.1 g/dL (ref 6.5–8.1)

## 2021-01-07 LAB — PROSTATE-SPECIFIC AG, SERUM (LABCORP): Prostate Specific Ag, Serum: 7.1 ng/mL — ABNORMAL HIGH (ref 0.0–4.0)

## 2021-01-08 ENCOUNTER — Ambulatory Visit: Payer: Medicare Other

## 2021-01-08 ENCOUNTER — Inpatient Hospital Stay (HOSPITAL_BASED_OUTPATIENT_CLINIC_OR_DEPARTMENT_OTHER): Payer: Medicare Other | Admitting: Oncology

## 2021-01-08 ENCOUNTER — Ambulatory Visit: Payer: Medicare Other | Admitting: Oncology

## 2021-01-08 ENCOUNTER — Other Ambulatory Visit: Payer: Self-pay

## 2021-01-08 ENCOUNTER — Inpatient Hospital Stay: Payer: Medicare Other

## 2021-01-08 VITALS — BP 134/76 | HR 83 | Temp 97.2°F | Resp 17 | Wt 167.2 lb

## 2021-01-08 DIAGNOSIS — C61 Malignant neoplasm of prostate: Secondary | ICD-10-CM | POA: Diagnosis not present

## 2021-01-08 DIAGNOSIS — Z7952 Long term (current) use of systemic steroids: Secondary | ICD-10-CM | POA: Diagnosis not present

## 2021-01-08 DIAGNOSIS — E876 Hypokalemia: Secondary | ICD-10-CM | POA: Diagnosis not present

## 2021-01-08 DIAGNOSIS — C7951 Secondary malignant neoplasm of bone: Secondary | ICD-10-CM | POA: Diagnosis not present

## 2021-01-08 DIAGNOSIS — I10 Essential (primary) hypertension: Secondary | ICD-10-CM | POA: Diagnosis not present

## 2021-01-08 MED ORDER — DENOSUMAB 120 MG/1.7ML ~~LOC~~ SOLN
120.0000 mg | Freq: Once | SUBCUTANEOUS | Status: AC
  Administered 2021-01-08: 120 mg via SUBCUTANEOUS
  Filled 2021-01-08: qty 1.7

## 2021-01-08 NOTE — Patient Instructions (Signed)
Denosumab injection What is this medication? DENOSUMAB (den oh sue mab) slows bone breakdown. Prolia is used to treat osteoporosis in women after menopause and in men, and in people who are taking corticosteroids for 6 months or more. Xgeva is used to treat a high calcium level due to cancer and to prevent bone fractures and other bone problems caused by multiple myeloma or cancer bone metastases. Xgeva is also used to treat giant cell tumor of the bone. This medicine may be used for other purposes; ask your health care provider or pharmacist if you have questions. COMMON BRAND NAME(S): Prolia, XGEVA What should I tell my care team before I take this medication? They need to know if you have any of these conditions: dental disease having surgery or tooth extraction infection kidney disease low levels of calcium or Vitamin D in the blood malnutrition on hemodialysis skin conditions or sensitivity thyroid or parathyroid disease an unusual reaction to denosumab, other medicines, foods, dyes, or preservatives pregnant or trying to get pregnant breast-feeding How should I use this medication? This medicine is for injection under the skin. It is given by a health care professional in a hospital or clinic setting. A special MedGuide will be given to you before each treatment. Be sure to read this information carefully each time. For Prolia, talk to your pediatrician regarding the use of this medicine in children. Special care may be needed. For Xgeva, talk to your pediatrician regarding the use of this medicine in children. While this drug may be prescribed for children as young as 13 years for selected conditions, precautions do apply. Overdosage: If you think you have taken too much of this medicine contact a poison control center or emergency room at once. NOTE: This medicine is only for you. Do not share this medicine with others. What if I miss a dose? It is important not to miss your dose.  Call your doctor or health care professional if you are unable to keep an appointment. What may interact with this medication? Do not take this medicine with any of the following medications: other medicines containing denosumab This medicine may also interact with the following medications: medicines that lower your chance of fighting infection steroid medicines like prednisone or cortisone This list may not describe all possible interactions. Give your health care provider a list of all the medicines, herbs, non-prescription drugs, or dietary supplements you use. Also tell them if you smoke, drink alcohol, or use illegal drugs. Some items may interact with your medicine. What should I watch for while using this medication? Visit your doctor or health care professional for regular checks on your progress. Your doctor or health care professional may order blood tests and other tests to see how you are doing. Call your doctor or health care professional for advice if you get a fever, chills or sore throat, or other symptoms of a cold or flu. Do not treat yourself. This drug may decrease your body's ability to fight infection. Try to avoid being around people who are sick. You should make sure you get enough calcium and vitamin D while you are taking this medicine, unless your doctor tells you not to. Discuss the foods you eat and the vitamins you take with your health care professional. See your dentist regularly. Brush and floss your teeth as directed. Before you have any dental work done, tell your dentist you are receiving this medicine. Do not become pregnant while taking this medicine or for 5 months after   stopping it. Talk with your doctor or health care professional about your birth control options while taking this medicine. Women should inform their doctor if they wish to become pregnant or think they might be pregnant. There is a potential for serious side effects to an unborn child. Talk to  your health care professional or pharmacist for more information. What side effects may I notice from receiving this medication? Side effects that you should report to your doctor or health care professional as soon as possible: allergic reactions like skin rash, itching or hives, swelling of the face, lips, or tongue bone pain breathing problems dizziness jaw pain, especially after dental work redness, blistering, peeling of the skin signs and symptoms of infection like fever or chills; cough; sore throat; pain or trouble passing urine signs of low calcium like fast heartbeat, muscle cramps or muscle pain; pain, tingling, numbness in the hands or feet; seizures unusual bleeding or bruising unusually weak or tired Side effects that usually do not require medical attention (report to your doctor or health care professional if they continue or are bothersome): constipation diarrhea headache joint pain loss of appetite muscle pain runny nose tiredness upset stomach This list may not describe all possible side effects. Call your doctor for medical advice about side effects. You may report side effects to FDA at 1-800-FDA-1088. Where should I keep my medication? This medicine is only given in a clinic, doctor's office, or other health care setting and will not be stored at home. NOTE: This sheet is a summary. It may not cover all possible information. If you have questions about this medicine, talk to your doctor, pharmacist, or health care provider.  2022 Elsevier/Gold Standard (2017-07-08 16:10:44)

## 2021-01-08 NOTE — Progress Notes (Signed)
Hematology and Oncology Follow Up Visit  Ethan [name] [id number] 02-25-1951 70 y.o. 01/08/2021 2:48 PM Ethan [name], MDJones, Ethan Right, MD   Principle Diagnosis: 70 year old man with castration-sensitive advanced prostate cancer diagnosed in 2019 he was found to have  PSA of 542 and Gleason score of 9.  Prior Therapy:   He is status post orchiectomy completed by Dr. Jeffie [name] on September 08, 2017.  Current therapy:  Zytiga 1000 mg daily with prednisone started in July 2019.  Xgeva on 120 mg every 8 weeks.    Interim History: Ethan [name] presents today for a follow-up visit.  Since the last visit, he reports feeling well without any complaints.  He denies any nausea, vomiting or abdominal discomfort.  He denies any recent hospitalizations or illnesses.  He denies any bone pain or pathological fractures.  He denies any urinary complaints.  Medications: Reviewed without changes. Current Outpatient Medications  Medication Sig Dispense Refill   abiraterone acetate (ZYTIGA) 500 MG tablet TAKE 2 TABLETS (1,000 MG TOTAL) BY MOUTH DAILY. TAKE ON AN EMPTY STOMACH 1 HOUR BEFORE OR 2 HOURS AFTER A MEAL. 60 tablet 0   Calcium Carb-Cholecalciferol (CALTRATE 600+D3 PO) Take 2 tablets by mouth daily.     hydrochlorothiazide (MICROZIDE) 12.5 MG capsule TAKE 1 CAPSULE BY MOUTH EVERY DAY 90 capsule 1   KLOR-CON M20 20 MEQ tablet TAKE 1 TABLET BY MOUTH EVERY DAY 90 tablet 1   Multiple Vitamin (MULTIVITAMIN) tablet Take 1 tablet by mouth daily.     predniSONE (DELTASONE) 5 MG tablet TAKE 1 TABLET BY MOUTH EVERY DAY WITH BREAKFAST 90 tablet 3   tamsulosin (FLOMAX) 0.4 MG CAPS capsule TAKE 1 CAPSULE (0.4 MG TOTAL) BY MOUTH DAILY AFTER SUPPER. 90 capsule 1   valsartan (DIOVAN) 80 MG tablet TAKE 1 TABLET BY MOUTH EVERY DAY 30 tablet 1   zolpidem (AMBIEN) 10 MG tablet Take 0.5 tablets (5 mg total) by mouth at bedtime as needed for sleep. 30 tablet 1   Current Facility-Administered Medications  Medication Dose  Route Frequency Provider Last Rate Last Admin   betamethasone acetate-betamethasone sodium phosphate (CELESTONE) injection 6 mg  6 mg Other Once Ethan [name], DPM       Facility-Administered Medications Ordered in Other Visits  Medication Dose Route Frequency Provider Last Rate Last Admin   denosumab (XGEVA) injection 120 mg  120 mg Subcutaneous Once Ethan Quizhpi N, MD         Allergies: No Known Allergies      Physical Exam:   Blood pressure 134/76, pulse 83, temperature (!) 97.2 F (36.2 C), temperature source Oral, resp. rate 17, weight 167 lb 4 oz (75.9 kg), SpO2 100 %.     ECOG: 0    General appearance: Alert, awake without any distress. Head: Atraumatic without abnormalities Oropharynx: Without any thrush or ulcers. Eyes: No scleral icterus. Lymph nodes: No lymphadenopathy noted in the cervical, supraclavicular, or axillary nodes Heart:regular rate and rhythm, without any murmurs or gallops.   Lung: Clear to auscultation without any rhonchi, wheezes or dullness to percussion. Abdomin: Soft, nontender without any shifting dullness or ascites. Musculoskeletal: No clubbing or cyanosis. Neurological: No motor or sensory deficits. Skin: No rashes or lesions.           .             Lab Results: Lab Results  Component Value Date   WBC 6.1 01/06/2021   HGB 13.9 01/06/2021   HCT 40.4 01/06/2021   MCV  91.0 01/06/2021   PLT 308 01/06/2021     Chemistry      Component Value Date/Time   NA 139 01/06/2021 0809   K 3.9 01/06/2021 0809   CL 105 01/06/2021 0809   CO2 25 01/06/2021 0809   BUN 23 01/06/2021 0809   BUN 22 (A) 08/25/2020 0000   CREATININE 0.80 01/06/2021 0809      Component Value Date/Time   CALCIUM 9.3 01/06/2021 0809   ALKPHOS 53 01/06/2021 0809   AST 18 01/06/2021 0809   ALT 14 01/06/2021 0809   BILITOT 0.9 01/06/2021 0809           Results for [name], [name] (MRN [id number]) as of 01/08/2021 14:36   Ref. Range 11/04/2020 07:48 01/06/2021 08:09  Prostate Specific Ag, Serum Latest Ref Range: 0.0 - 4.0 ng/mL 7.3 (H) 7.1 (H)                Impression and Plan:   70 year old man with:   1.    Castration-sensitive advanced prostate cancer with disease to the bone and lymphadenopathy diagnosed in 2019.     He continues to tolerate Zytiga reasonably well with reasonable PSA response that continues to decline slowly.  Risks and benefits of continuing this treatment were discussed at this time.  Complications that include hypertension, edema among others were reviewed.  Salvage therapy options including systemic chemotherapy will be deferred to a later date.   2.  Androgen deprivation: He is status post orchiectomy without any additional androgen deprivation needed.   3.  Hypertension: His blood pressure continues to be close to normal range.   4.    Bone directed therapy: He is currently on Xgeva without any complications.  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  He is agreeable to proceed.  5.  Prognosis and goals of care: Therapy remains palliative although aggressive measures are warranted given his reasonable performance status.  6.  Hypocalcemia: Calcium is adequate on the current replacement and will proceed with Xgeva.  7.  Hypokalemia: No issues reported at this time with potassium and normal range  8.  Follow-up: He will return in 2 months for a follow-up visit.   30 minutes were spent on this encounter.  The time was dedicated to reviewing laboratory data, disease status update, treatment choices and addressing complication related to cancer and cancer therapy.   Ethan Button, MD 10/27/20222:48 PM

## 2021-01-12 ENCOUNTER — Encounter: Payer: Self-pay | Admitting: Oncology

## 2021-01-29 ENCOUNTER — Other Ambulatory Visit: Payer: Self-pay | Admitting: Oncology

## 2021-01-29 ENCOUNTER — Other Ambulatory Visit (HOSPITAL_COMMUNITY): Payer: Self-pay

## 2021-01-29 ENCOUNTER — Other Ambulatory Visit: Payer: Self-pay

## 2021-01-29 DIAGNOSIS — C61 Malignant neoplasm of prostate: Secondary | ICD-10-CM

## 2021-01-29 MED ORDER — ABIRATERONE ACETATE 500 MG PO TABS
ORAL_TABLET | ORAL | 0 refills | Status: DC
  Filled 2021-01-29: qty 60, fill #0

## 2021-01-29 MED ORDER — ABIRATERONE ACETATE 250 MG PO TABS
1000.0000 mg | ORAL_TABLET | Freq: Every day | ORAL | 0 refills | Status: DC
  Filled 2021-01-29: qty 120, 30d supply, fill #0

## 2021-02-03 ENCOUNTER — Other Ambulatory Visit (HOSPITAL_COMMUNITY): Payer: Self-pay

## 2021-02-04 ENCOUNTER — Other Ambulatory Visit: Payer: Self-pay | Admitting: Oncology

## 2021-02-04 DIAGNOSIS — C61 Malignant neoplasm of prostate: Secondary | ICD-10-CM

## 2021-03-01 ENCOUNTER — Other Ambulatory Visit: Payer: Self-pay | Admitting: Oncology

## 2021-03-01 DIAGNOSIS — C61 Malignant neoplasm of prostate: Secondary | ICD-10-CM

## 2021-03-02 ENCOUNTER — Encounter: Payer: Self-pay | Admitting: Oncology

## 2021-03-02 ENCOUNTER — Other Ambulatory Visit (HOSPITAL_COMMUNITY): Payer: Self-pay

## 2021-03-02 ENCOUNTER — Other Ambulatory Visit: Payer: Self-pay | Admitting: Oncology

## 2021-03-02 MED ORDER — ABIRATERONE ACETATE 250 MG PO TABS
1000.0000 mg | ORAL_TABLET | Freq: Every day | ORAL | 0 refills | Status: DC
  Filled 2021-03-02: qty 120, 30d supply, fill #0

## 2021-03-03 ENCOUNTER — Inpatient Hospital Stay: Payer: Medicare Other | Attending: Oncology

## 2021-03-03 ENCOUNTER — Other Ambulatory Visit: Payer: Self-pay

## 2021-03-03 DIAGNOSIS — Z9079 Acquired absence of other genital organ(s): Secondary | ICD-10-CM | POA: Insufficient documentation

## 2021-03-03 DIAGNOSIS — Z7952 Long term (current) use of systemic steroids: Secondary | ICD-10-CM | POA: Diagnosis not present

## 2021-03-03 DIAGNOSIS — C7951 Secondary malignant neoplasm of bone: Secondary | ICD-10-CM | POA: Insufficient documentation

## 2021-03-03 DIAGNOSIS — Z79899 Other long term (current) drug therapy: Secondary | ICD-10-CM | POA: Insufficient documentation

## 2021-03-03 DIAGNOSIS — I10 Essential (primary) hypertension: Secondary | ICD-10-CM | POA: Diagnosis not present

## 2021-03-03 DIAGNOSIS — C61 Malignant neoplasm of prostate: Secondary | ICD-10-CM | POA: Insufficient documentation

## 2021-03-03 DIAGNOSIS — E876 Hypokalemia: Secondary | ICD-10-CM | POA: Diagnosis not present

## 2021-03-03 LAB — CMP (CANCER CENTER ONLY)
ALT: 14 U/L (ref 0–44)
AST: 18 U/L (ref 15–41)
Albumin: 4.5 g/dL (ref 3.5–5.0)
Alkaline Phosphatase: 48 U/L (ref 38–126)
Anion gap: 9 (ref 5–15)
BUN: 17 mg/dL (ref 8–23)
CO2: 26 mmol/L (ref 22–32)
Calcium: 9.5 mg/dL (ref 8.9–10.3)
Chloride: 103 mmol/L (ref 98–111)
Creatinine: 0.85 mg/dL (ref 0.61–1.24)
GFR, Estimated: >60 mL/min (ref >60)
Glucose, Bld: 110 mg/dL — ABNORMAL HIGH (ref 70–99)
Potassium: 3.7 mmol/L (ref 3.5–5.1)
Sodium: 138 mmol/L (ref 135–145)
Total Bilirubin: 1.1 mg/dL (ref 0.3–1.2)
Total Protein: 6.8 g/dL (ref 6.5–8.1)

## 2021-03-03 LAB — CBC WITH DIFFERENTIAL (CANCER CENTER ONLY)
Abs Immature Granulocytes: 0.03 10*3/uL (ref 0.00–0.07)
Basophils Absolute: 0.1 10*3/uL (ref 0.0–0.1)
Basophils Relative: 1 %
Eosinophils Absolute: 0.2 10*3/uL (ref 0.0–0.5)
Eosinophils Relative: 3 %
HCT: 40.4 % (ref 39.0–52.0)
Hemoglobin: 14 g/dL (ref 13.0–17.0)
Immature Granulocytes: 1 %
Lymphocytes Relative: 30 %
Lymphs Abs: 1.8 10*3/uL (ref 0.7–4.0)
MCH: 31.2 pg (ref 26.0–34.0)
MCHC: 34.7 g/dL (ref 30.0–36.0)
MCV: 90 fL (ref 80.0–100.0)
Monocytes Absolute: 0.6 10*3/uL (ref 0.1–1.0)
Monocytes Relative: 10 %
Neutro Abs: 3.4 10*3/uL (ref 1.7–7.7)
Neutrophils Relative %: 55 %
Platelet Count: 252 10*3/uL (ref 150–400)
RBC: 4.49 MIL/uL (ref 4.22–5.81)
RDW: 11.9 % (ref 11.5–15.5)
WBC Count: 6 10*3/uL (ref 4.0–10.5)
nRBC: 0 % (ref 0.0–0.2)

## 2021-03-04 ENCOUNTER — Inpatient Hospital Stay (HOSPITAL_BASED_OUTPATIENT_CLINIC_OR_DEPARTMENT_OTHER): Payer: Medicare Other | Admitting: Oncology

## 2021-03-04 ENCOUNTER — Inpatient Hospital Stay: Payer: Medicare Other

## 2021-03-04 VITALS — BP 134/88 | HR 87 | Temp 97.9°F | Resp 18 | Ht 70.0 in | Wt 173.7 lb

## 2021-03-04 DIAGNOSIS — E876 Hypokalemia: Secondary | ICD-10-CM | POA: Diagnosis not present

## 2021-03-04 DIAGNOSIS — C61 Malignant neoplasm of prostate: Secondary | ICD-10-CM | POA: Diagnosis not present

## 2021-03-04 DIAGNOSIS — C7951 Secondary malignant neoplasm of bone: Secondary | ICD-10-CM | POA: Diagnosis not present

## 2021-03-04 DIAGNOSIS — Z9079 Acquired absence of other genital organ(s): Secondary | ICD-10-CM | POA: Diagnosis not present

## 2021-03-04 DIAGNOSIS — I10 Essential (primary) hypertension: Secondary | ICD-10-CM | POA: Diagnosis not present

## 2021-03-04 LAB — PROSTATE-SPECIFIC AG, SERUM (LABCORP): Prostate Specific Ag, Serum: 6.2 ng/mL — ABNORMAL HIGH (ref 0.0–4.0)

## 2021-03-04 MED ORDER — DENOSUMAB 120 MG/1.7ML ~~LOC~~ SOLN
120.0000 mg | Freq: Once | SUBCUTANEOUS | Status: AC
  Administered 2021-03-04: 15:00:00 120 mg via SUBCUTANEOUS
  Filled 2021-03-04: qty 1.7

## 2021-03-04 NOTE — Patient Instructions (Signed)
Denosumab injection What is this medication? DENOSUMAB (den oh sue mab) slows bone breakdown. Prolia is used to treat osteoporosis in women after menopause and in men, and in people who are taking corticosteroids for 6 months or more. Delton See is used to treat a high calcium level due to cancer and to prevent bone fractures and other bone problems caused by multiple myeloma or cancer bone metastases. Delton See is also used to treat giant cell tumor of the bone. This medicine may be used for other purposes; ask your health care provider or pharmacist if you have questions. COMMON BRAND NAME(S): Prolia, XGEVA What should I tell my care team before I take this medication? They need to know if you have any of these conditions: dental disease having surgery or tooth extraction infection kidney disease low levels of calcium or Vitamin D in the blood malnutrition on hemodialysis skin conditions or sensitivity thyroid or parathyroid disease an unusual reaction to denosumab, other medicines, foods, dyes, or preservatives pregnant or trying to get pregnant breast-feeding How should I use this medication? This medicine is for injection under the skin. It is given by a health care professional in a hospital or clinic setting. A special MedGuide will be given to you before each treatment. Be sure to read this information carefully each time. For Prolia, talk to your pediatrician regarding the use of this medicine in children. Special care may be needed. For Delton See, talk to your pediatrician regarding the use of this medicine in children. While this drug may be prescribed for children as young as 13 years for selected conditions, precautions do apply. Overdosage: If you think you have taken too much of this medicine contact a poison control center or emergency room at once. NOTE: This medicine is only for you. Do not share this medicine with others. What if I miss a dose? It is important not to miss your dose.  Call your doctor or health care professional if you are unable to keep an appointment. What may interact with this medication? Do not take this medicine with any of the following medications: other medicines containing denosumab This medicine may also interact with the following medications: medicines that lower your chance of fighting infection steroid medicines like prednisone or cortisone This list may not describe all possible interactions. Give your health care provider a list of all the medicines, herbs, non-prescription drugs, or dietary supplements you use. Also tell them if you smoke, drink alcohol, or use illegal drugs. Some items may interact with your medicine. What should I watch for while using this medication? Visit your doctor or health care professional for regular checks on your progress. Your doctor or health care professional may order blood tests and other tests to see how you are doing. Call your doctor or health care professional for advice if you get a fever, chills or sore throat, or other symptoms of a cold or flu. Do not treat yourself. This drug may decrease your body's ability to fight infection. Try to avoid being around people who are sick. You should make sure you get enough calcium and vitamin D while you are taking this medicine, unless your doctor tells you not to. Discuss the foods you eat and the vitamins you take with your health care professional. See your dentist regularly. Brush and floss your teeth as directed. Before you have any dental work done, tell your dentist you are receiving this medicine. Do not become pregnant while taking this medicine or for 5 months after  stopping it. Talk with your doctor or health care professional about your birth control options while taking this medicine. Women should inform their doctor if they wish to become pregnant or think they might be pregnant. There is a potential for serious side effects to an unborn child. Talk to  your health care professional or pharmacist for more information. What side effects may I notice from receiving this medication? Side effects that you should report to your doctor or health care professional as soon as possible: allergic reactions like skin rash, itching or hives, swelling of the face, lips, or tongue bone pain breathing problems dizziness jaw pain, especially after dental work redness, blistering, peeling of the skin signs and symptoms of infection like fever or chills; cough; sore throat; pain or trouble passing urine signs of low calcium like fast heartbeat, muscle cramps or muscle pain; pain, tingling, numbness in the hands or feet; seizures unusual bleeding or bruising unusually weak or tired Side effects that usually do not require medical attention (report to your doctor or health care professional if they continue or are bothersome): constipation diarrhea headache joint pain loss of appetite muscle pain runny nose tiredness upset stomach This list may not describe all possible side effects. Call your doctor for medical advice about side effects. You may report side effects to FDA at 1-800-FDA-1088. Where should I keep my medication? This medicine is only given in a clinic, doctor's office, or other health care setting and will not be stored at home. NOTE: This sheet is a summary. It may not cover all possible information. If you have questions about this medicine, talk to your doctor, pharmacist, or health care provider.  2022 Elsevier/Gold Standard (2017-07-08 00:00:00)

## 2021-03-04 NOTE — Progress Notes (Signed)
Hematology and Oncology Follow Up Visit  Ethan [name] [id number] [birth year] 70 y.o. 03/04/2021 2:33 PM [name], MDJones, [name], MD   Principle Diagnosis: 70 year old man with advanced prostate cancer diagnosed in 2019 with a PSA of 542 and a Gleason score of 9.  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   Prior Therapy:   He is status post orchiectomy completed by Dr. Jeffie Pollock on September 08, 2017.  Current therapy:  Zytiga 1000 mg daily with prednisone started in July 2019.  Xgeva on 120 mg every 8 weeks.    Interim History: [name] returns today for repeat evaluation.  Since the last visit, he reports no major changes in his health.  He continues to tolerate Zytiga reasonably well without any complaints.  He denies any nausea, vomiting or abdominal pain.  He denies any worsening edema or fatigue.  His performance status and quality of life remains unchanged.  Medications: Reviewed without changes. Current Outpatient Medications  Medication Sig Dispense Refill   abiraterone acetate (ZYTIGA) 250 MG tablet Take 4 tablets (1,000 mg total) by mouth daily. Take on an empty stomach 1 hour before or 2 hours after a meal 120 tablet 0   Calcium Carb-Cholecalciferol (CALTRATE 600+D3 PO) Take 2 tablets by mouth daily.     hydrochlorothiazide (MICROZIDE) 12.5 MG capsule TAKE 1 CAPSULE BY MOUTH EVERY DAY 90 capsule 1   KLOR-CON M20 20 MEQ tablet TAKE 1 TABLET BY MOUTH EVERY DAY 90 tablet 1   Multiple Vitamin (MULTIVITAMIN) tablet Take 1 tablet by mouth daily.     predniSONE (DELTASONE) 5 MG tablet TAKE 1 TABLET BY MOUTH EVERY DAY WITH BREAKFAST 90 tablet 3   tamsulosin (FLOMAX) 0.4 MG CAPS capsule TAKE 1 CAPSULE (0.4 MG TOTAL) BY MOUTH DAILY AFTER SUPPER. 90 capsule 1   valsartan (DIOVAN) 80 MG tablet TAKE 1 TABLET BY MOUTH EVERY DAY 30 tablet 1   zolpidem (AMBIEN) 10 MG tablet Take 0.5 tablets (5 mg total) by mouth at bedtime as needed for sleep. 30 tablet 1   Current  Facility-Administered Medications  Medication Dose Route Frequency Provider Last Rate Last Admin   betamethasone acetate-betamethasone sodium phosphate (CELESTONE) injection 6 mg  6 mg Other Once Evelina Bucy, DPM         Allergies: No Known Allergies      Physical Exam:    Blood pressure 134/88, pulse 87, temperature 97.9 F (36.6 C), temperature source Temporal, resp. rate 18, height 5\' 10"  (1.778 m), weight 173 lb 11.2 oz (78.8 kg), SpO2 100 %.     ECOG: 0    General appearance: Comfortable appearing without any discomfort Head: Normocephalic without any trauma Oropharynx: Mucous membranes are moist and pink without any thrush or ulcers. Eyes: Pupils are equal and round reactive to light. Lymph nodes: No cervical, supraclavicular, inguinal or axillary lymphadenopathy.   Heart:regular rate and rhythm.  S1 and S2 without leg edema. Lung: Clear without any rhonchi or wheezes.  No dullness to percussion. Abdomin: Soft, nontender, nondistended with good bowel sounds.  No hepatosplenomegaly. Musculoskeletal: No joint deformity or effusion.  Full range of motion noted. Neurological: No deficits noted on motor, sensory and deep tendon reflex exam. Skin: No petechial rash or dryness.  Appeared moist.             .             Lab Results: Lab Results  Component Value Date   WBC 6.0 03/03/2021   HGB  14.0 03/03/2021   HCT 40.4 03/03/2021   MCV 90.0 03/03/2021   PLT 252 03/03/2021     Chemistry      Component Value Date/Time   NA 138 03/03/2021 0820   K 3.7 03/03/2021 0820   CL 103 03/03/2021 0820   CO2 26 03/03/2021 0820   BUN 17 03/03/2021 0820   BUN 22 (A) 08/25/2020 0000   CREATININE 0.85 03/03/2021 0820      Component Value Date/Time   CALCIUM 9.5 03/03/2021 0820   ALKPHOS 48 03/03/2021 0820   AST 18 03/03/2021 0820   ALT 14 03/03/2021 0820   BILITOT 1.1 03/03/2021 0820      Latest Reference Range & Units 11/04/20 07:48 01/06/21  08:09 03/03/21 08:20  Prostate Specific Ag, Serum 0.0 - 4.0 ng/mL 7.3 (H) 7.1 (H) 6.2 (H)  (H): Data is abnormally high    Impression and Plan:   70 year old man with:   1.    Advanced prostate cancer with lymphadenopathy diagnosed in 2019.  He has castration-sensitive disease.   His PSA continues to decline slowly without any major complaints related to Zytiga.  Risks and benefits were discussed at this time.  Complications that include nausea, fatigue, weight gain, edema among others were reviewed.  Alternative treatment options including systemic chemotherapy were reiterated.  At this time I recommended continuing the same dose and schedule given his reasonable response.  The plan is to update staging scan with PSMA PET prior next visit.   2.  Androgen deprivation: No additional treatment is needed.  He is status post orchiectomy.   3.  Hypertension: Blood pressure is mildly elevated but overall close to normal range between visits.   4.    Bone directed therapy: Complication associated with Delton See were reviewed including osteonecrosis of the jaw and hypocalcemia.  He is not experiencing any at this time.  We will proceed with injection today.  His calcium is adequate.  5.  Prognosis and goals of care: Therapy remains palliative although aggressive measures are warranted given his excellent performance status.  6.  Hypokalemia: His potassium continues to be within normal range.  No additional supplements are needed.  This will be monitored on Zytiga.   7.  Follow-up: In 2 months for repeat follow-up..   30 minutes were dedicated to this visit.  The time was spent on reviewing laboratory data, disease status update and outlining future plan of care discussion.  Zola Button, MD 12/21/20222:33 PM

## 2021-03-05 ENCOUNTER — Other Ambulatory Visit (HOSPITAL_COMMUNITY): Payer: Self-pay

## 2021-03-23 ENCOUNTER — Encounter: Payer: Self-pay | Admitting: Oncology

## 2021-03-25 ENCOUNTER — Other Ambulatory Visit: Payer: Self-pay | Admitting: Oncology

## 2021-03-25 DIAGNOSIS — C61 Malignant neoplasm of prostate: Secondary | ICD-10-CM

## 2021-03-31 ENCOUNTER — Other Ambulatory Visit: Payer: Self-pay | Admitting: Oncology

## 2021-03-31 ENCOUNTER — Other Ambulatory Visit (HOSPITAL_COMMUNITY): Payer: Self-pay

## 2021-03-31 MED ORDER — ABIRATERONE ACETATE 250 MG PO TABS
1000.0000 mg | ORAL_TABLET | Freq: Every day | ORAL | 0 refills | Status: DC
  Filled 2021-03-31: qty 120, 30d supply, fill #0

## 2021-04-02 DIAGNOSIS — Z20822 Contact with and (suspected) exposure to covid-19: Secondary | ICD-10-CM | POA: Diagnosis not present

## 2021-04-02 DIAGNOSIS — Z1152 Encounter for screening for COVID-19: Secondary | ICD-10-CM | POA: Diagnosis not present

## 2021-04-05 DIAGNOSIS — Z20828 Contact with and (suspected) exposure to other viral communicable diseases: Secondary | ICD-10-CM | POA: Diagnosis not present

## 2021-04-06 ENCOUNTER — Other Ambulatory Visit (HOSPITAL_COMMUNITY): Payer: Self-pay

## 2021-04-16 ENCOUNTER — Other Ambulatory Visit: Payer: Self-pay

## 2021-04-16 ENCOUNTER — Encounter (HOSPITAL_COMMUNITY)
Admission: RE | Admit: 2021-04-16 | Discharge: 2021-04-16 | Disposition: A | Discharge status: Home / Self Care | Payer: Medicare Other | Source: Ambulatory Visit | Attending: Oncology | Admitting: Oncology

## 2021-04-16 DIAGNOSIS — C61 Malignant neoplasm of prostate: Secondary | ICD-10-CM | POA: Diagnosis not present

## 2021-04-16 DIAGNOSIS — C7951 Secondary malignant neoplasm of bone: Secondary | ICD-10-CM | POA: Diagnosis not present

## 2021-04-16 MED ORDER — PIFLIFOLASTAT F 18 (PYLARIFY) INJECTION
9.0000 | Freq: Once | INTRAVENOUS | Status: AC
  Administered 2021-04-16: 9 via INTRAVENOUS

## 2021-04-27 ENCOUNTER — Other Ambulatory Visit: Payer: Self-pay | Admitting: Oncology

## 2021-04-27 DIAGNOSIS — C61 Malignant neoplasm of prostate: Secondary | ICD-10-CM

## 2021-04-28 ENCOUNTER — Other Ambulatory Visit: Payer: Self-pay | Admitting: Oncology

## 2021-04-28 ENCOUNTER — Other Ambulatory Visit (HOSPITAL_COMMUNITY): Payer: Self-pay

## 2021-04-28 MED ORDER — ABIRATERONE ACETATE 250 MG PO TABS
1000.0000 mg | ORAL_TABLET | Freq: Every day | ORAL | 0 refills | Status: DC
  Filled 2021-05-04: qty 120, 30d supply, fill #0

## 2021-05-04 ENCOUNTER — Encounter: Payer: Self-pay | Admitting: Oncology

## 2021-05-04 ENCOUNTER — Other Ambulatory Visit (HOSPITAL_COMMUNITY): Payer: Self-pay

## 2021-05-05 ENCOUNTER — Other Ambulatory Visit (HOSPITAL_COMMUNITY): Payer: Self-pay

## 2021-05-05 ENCOUNTER — Other Ambulatory Visit: Payer: Self-pay

## 2021-05-05 ENCOUNTER — Inpatient Hospital Stay: Payer: Medicare Other | Attending: Oncology

## 2021-05-05 DIAGNOSIS — Z9079 Acquired absence of other genital organ(s): Secondary | ICD-10-CM | POA: Diagnosis not present

## 2021-05-05 DIAGNOSIS — C61 Malignant neoplasm of prostate: Secondary | ICD-10-CM | POA: Insufficient documentation

## 2021-05-05 DIAGNOSIS — I10 Essential (primary) hypertension: Secondary | ICD-10-CM | POA: Insufficient documentation

## 2021-05-05 DIAGNOSIS — C7951 Secondary malignant neoplasm of bone: Secondary | ICD-10-CM | POA: Insufficient documentation

## 2021-05-05 DIAGNOSIS — E876 Hypokalemia: Secondary | ICD-10-CM | POA: Insufficient documentation

## 2021-05-05 DIAGNOSIS — Z7952 Long term (current) use of systemic steroids: Secondary | ICD-10-CM | POA: Insufficient documentation

## 2021-05-05 DIAGNOSIS — Z79899 Other long term (current) drug therapy: Secondary | ICD-10-CM | POA: Insufficient documentation

## 2021-05-05 LAB — CMP (CANCER CENTER ONLY)
ALT: 14 U/L (ref 0–44)
AST: 18 U/L (ref 15–41)
Albumin: 4.3 g/dL (ref 3.5–5.0)
Alkaline Phosphatase: 47 U/L (ref 38–126)
Anion gap: 5 (ref 5–15)
BUN: 19 mg/dL (ref 8–23)
CO2: 29 mmol/L (ref 22–32)
Calcium: 9.7 mg/dL (ref 8.9–10.3)
Chloride: 105 mmol/L (ref 98–111)
Creatinine: 0.77 mg/dL (ref 0.61–1.24)
GFR, Estimated: >60 mL/min (ref >60)
Glucose, Bld: 115 mg/dL — ABNORMAL HIGH (ref 70–99)
Potassium: 4.1 mmol/L (ref 3.5–5.1)
Sodium: 139 mmol/L (ref 135–145)
Total Bilirubin: 1 mg/dL (ref 0.3–1.2)
Total Protein: 6.7 g/dL (ref 6.5–8.1)

## 2021-05-05 LAB — CBC WITH DIFFERENTIAL (CANCER CENTER ONLY)
Abs Immature Granulocytes: 0.02 10*3/uL (ref 0.00–0.07)
Basophils Absolute: 0 10*3/uL (ref 0.0–0.1)
Basophils Relative: 1 %
Eosinophils Absolute: 0.2 10*3/uL (ref 0.0–0.5)
Eosinophils Relative: 4 %
HCT: 39 % (ref 39.0–52.0)
Hemoglobin: 13.4 g/dL (ref 13.0–17.0)
Immature Granulocytes: 0 %
Lymphocytes Relative: 30 %
Lymphs Abs: 1.8 10*3/uL (ref 0.7–4.0)
MCH: 31.2 pg (ref 26.0–34.0)
MCHC: 34.4 g/dL (ref 30.0–36.0)
MCV: 90.7 fL (ref 80.0–100.0)
Monocytes Absolute: 0.5 10*3/uL (ref 0.1–1.0)
Monocytes Relative: 9 %
Neutro Abs: 3.4 10*3/uL (ref 1.7–7.7)
Neutrophils Relative %: 56 %
Platelet Count: 255 10*3/uL (ref 150–400)
RBC: 4.3 MIL/uL (ref 4.22–5.81)
RDW: 11.9 % (ref 11.5–15.5)
WBC Count: 6 10*3/uL (ref 4.0–10.5)
nRBC: 0 % (ref 0.0–0.2)

## 2021-05-06 ENCOUNTER — Other Ambulatory Visit (HOSPITAL_COMMUNITY): Payer: Self-pay

## 2021-05-06 LAB — PROSTATE-SPECIFIC AG, SERUM (LABCORP): Prostate Specific Ag, Serum: 5.8 ng/mL — ABNORMAL HIGH (ref 0.0–4.0)

## 2021-05-07 ENCOUNTER — Other Ambulatory Visit: Payer: Self-pay

## 2021-05-07 ENCOUNTER — Inpatient Hospital Stay: Payer: Medicare Other

## 2021-05-07 ENCOUNTER — Inpatient Hospital Stay (HOSPITAL_BASED_OUTPATIENT_CLINIC_OR_DEPARTMENT_OTHER): Payer: Medicare Other | Admitting: Oncology

## 2021-05-07 ENCOUNTER — Other Ambulatory Visit (HOSPITAL_COMMUNITY): Payer: Self-pay

## 2021-05-07 VITALS — BP 105/78 | HR 66 | Temp 97.0°F | Resp 18 | Wt 176.0 lb

## 2021-05-07 DIAGNOSIS — Z9079 Acquired absence of other genital organ(s): Secondary | ICD-10-CM | POA: Diagnosis not present

## 2021-05-07 DIAGNOSIS — C61 Malignant neoplasm of prostate: Secondary | ICD-10-CM

## 2021-05-07 DIAGNOSIS — Z13220 Encounter for screening for lipoid disorders: Secondary | ICD-10-CM

## 2021-05-07 DIAGNOSIS — I10 Essential (primary) hypertension: Secondary | ICD-10-CM | POA: Diagnosis not present

## 2021-05-07 DIAGNOSIS — C7951 Secondary malignant neoplasm of bone: Secondary | ICD-10-CM | POA: Diagnosis not present

## 2021-05-07 DIAGNOSIS — E876 Hypokalemia: Secondary | ICD-10-CM | POA: Diagnosis not present

## 2021-05-07 MED ORDER — DENOSUMAB 120 MG/1.7ML ~~LOC~~ SOLN
120.0000 mg | Freq: Once | SUBCUTANEOUS | Status: AC
  Administered 2021-05-07: 120 mg via SUBCUTANEOUS
  Filled 2021-05-07: qty 1.7

## 2021-05-07 NOTE — Progress Notes (Signed)
Hematology and Oncology Follow Up Visit  Ethan [name] [id number] 08-23-1950 71 y.o. 05/07/2021 2:27 PM [name], MDJones, [name], MD   Principle Diagnosis: 71 year old man with castration-sensitive advanced prostate cancer presented with a PSA of 542 and a Gleason score of 9 in 2019.   Prior Therapy:   He is status post orchiectomy completed by Dr. Jeffie Pollock on September 08, 2017.  Current therapy:  Zytiga 1000 mg daily with prednisone started in July 2019.  Xgeva on 120 mg every 8 weeks.    Interim History: Mr. Hinchman is here for a follow-up visit.  Since last visit, he reports no major changes in his health.  He continues to tolerate Zytiga without any complaints.  He denies any nausea vomiting or abdominal pain.  He denies any weight loss or appetite changes.  He denies any excessive fatigue or edema.  His performance status quality of life remains unchanged.  Medications: Updated on review. Current Outpatient Medications  Medication Sig Dispense Refill   abiraterone acetate (ZYTIGA) 250 MG tablet Take 4 tablets (1,000 mg total) by mouth daily. Take on an empty stomach 1 hour before or 2 hours after a meal 120 tablet 0   Calcium Carb-Cholecalciferol (CALTRATE 600+D3 PO) Take 2 tablets by mouth daily.     hydrochlorothiazide (MICROZIDE) 12.5 MG capsule TAKE 1 CAPSULE BY MOUTH EVERY DAY 90 capsule 1   KLOR-CON M20 20 MEQ tablet TAKE 1 TABLET BY MOUTH EVERY DAY 90 tablet 1   Multiple Vitamin (MULTIVITAMIN) tablet Take 1 tablet by mouth daily.     predniSONE (DELTASONE) 5 MG tablet TAKE 1 TABLET BY MOUTH EVERY DAY WITH BREAKFAST 90 tablet 3   tamsulosin (FLOMAX) 0.4 MG CAPS capsule TAKE 1 CAPSULE (0.4 MG TOTAL) BY MOUTH DAILY AFTER SUPPER. 90 capsule 1   valsartan (DIOVAN) 80 MG tablet TAKE 1 TABLET BY MOUTH EVERY DAY 30 tablet 1   zolpidem (AMBIEN) 10 MG tablet Take 0.5 tablets (5 mg total) by mouth at bedtime as needed for sleep. 30 tablet 1   Current Facility-Administered  Medications  Medication Dose Route Frequency Provider Last Rate Last Admin   betamethasone acetate-betamethasone sodium phosphate (CELESTONE) injection 6 mg  6 mg Other Once Evelina Bucy, DPM         Allergies: No Known Allergies      Physical Exam:    Blood pressure 105/78, pulse 66, temperature (!) 97 F (36.1 C), resp. rate 18, weight 176 lb (79.8 kg), SpO2 99 %.      ECOG: 0   General appearance: Alert, awake without any distress. Head: Atraumatic without abnormalities Oropharynx: Without any thrush or ulcers. Eyes: No scleral icterus. Lymph nodes: No lymphadenopathy noted in the cervical, supraclavicular, or axillary nodes Heart:regular rate and rhythm, without any murmurs or gallops.   Lung: Clear to auscultation without any rhonchi, wheezes or dullness to percussion. Abdomin: Soft, nontender without any shifting dullness or ascites. Musculoskeletal: No clubbing or cyanosis. Neurological: No motor or sensory deficits. Skin: No rashes or lesions.             .             Lab Results: Lab Results  Component Value Date   WBC 6.0 05/05/2021   HGB 13.4 05/05/2021   HCT 39.0 05/05/2021   MCV 90.7 05/05/2021   PLT 255 05/05/2021     Chemistry      Component Value Date/Time   NA 139 05/05/2021 0749   K 4.1  05/05/2021 0749   CL 105 05/05/2021 0749   CO2 29 05/05/2021 0749   BUN 19 05/05/2021 0749   BUN 22 (A) 08/25/2020 0000   CREATININE 0.77 05/05/2021 0749      Component Value Date/Time   CALCIUM 9.7 05/05/2021 0749   ALKPHOS 47 05/05/2021 0749   AST 18 05/05/2021 0749   ALT 14 05/05/2021 0749   BILITOT 1.0 05/05/2021 0749       Latest Reference Range & Units 11/04/20 07:48 01/06/21 08:09 03/03/21 08:20 05/05/21 07:49  Prostate Specific Ag, Serum 0.0 - 4.0 ng/mL 7.3 (H) 7.1 (H) 6.2 (H) 5.8 (H)    IMPRESSION: 1. Focal lesion just LEFT of midline within the LEFT lobe of the prostate gland concerning for local prostate  cancer recurrence. 2. Intensely radiotracer avid small external and common iliac lymph nodes consistent with local nodal metastasis in the pelvis. 3. Progression skeletal metastasis with several new lesions in the calvarium, spine, pelvis and proximal femur.    Impression and Plan:   71 year old man with:   1.   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  Marland Kitchen   His disease status was updated at this time and treatment wishes were discussed.  His PSA continues to decline although his PSMA PET scan did show areas of lymphadenopathy and bone disease which could be chronic in nature.  Risks and benefits of continuing Zytiga at this time versus switching to Taxotere chemotherapy were discussed.  It is possible that this disease is existed prior to his current treatment and comparing it to conventional imaging probably would be misleading.  For the time being, I recommended continuing Zytiga and we will update his staging scans in 6 months.   2.  Androgen deprivation: No additional androgen deprivation therapy needed at this time.  He is status post orchiectomy.   3.  Hypertension: His blood pressure is within normal range at this time.  Continue to monitor.   4.    Bone directed therapy: He will continue to be on Xgeva which will be repeated today.  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  His calcium is within normal range we will continue to monitor on this therapy.  5.  Prognosis and goals of care: His disease is incurable but aggressive measures are warranted given his excellent performance status.  6.  Hypokalemia: His potassium is within normal range at this time.   7.  Follow-up: In 2 months for repeat follow-up..   30 minutes were dedicated to this visit.  The time was spent on reviewing laboratory data, disease status update and outlining future plan of care discussion.  Zola Button, MD 2/23/20232:27 PM

## 2021-05-13 ENCOUNTER — Other Ambulatory Visit: Payer: Self-pay | Admitting: Oncology

## 2021-05-14 ENCOUNTER — Other Ambulatory Visit: Payer: Self-pay | Admitting: Oncology

## 2021-05-20 ENCOUNTER — Other Ambulatory Visit: Payer: Self-pay | Admitting: Oncology

## 2021-05-20 DIAGNOSIS — C61 Malignant neoplasm of prostate: Secondary | ICD-10-CM

## 2021-05-26 ENCOUNTER — Other Ambulatory Visit (HOSPITAL_COMMUNITY): Payer: Self-pay

## 2021-05-26 ENCOUNTER — Other Ambulatory Visit: Payer: Self-pay | Admitting: Oncology

## 2021-05-26 MED ORDER — ABIRATERONE ACETATE 250 MG PO TABS
1000.0000 mg | ORAL_TABLET | Freq: Every day | ORAL | 0 refills | Status: DC
  Filled 2021-06-04: qty 120, 30d supply, fill #0

## 2021-05-28 ENCOUNTER — Other Ambulatory Visit (HOSPITAL_COMMUNITY): Payer: Self-pay

## 2021-05-30 DIAGNOSIS — Z20822 Contact with and (suspected) exposure to covid-19: Secondary | ICD-10-CM | POA: Diagnosis not present

## 2021-06-04 ENCOUNTER — Other Ambulatory Visit (HOSPITAL_COMMUNITY): Payer: Self-pay

## 2021-06-06 DIAGNOSIS — Z20822 Contact with and (suspected) exposure to covid-19: Secondary | ICD-10-CM | POA: Diagnosis not present

## 2021-06-16 ENCOUNTER — Other Ambulatory Visit: Payer: Self-pay | Admitting: Oncology

## 2021-06-16 DIAGNOSIS — C61 Malignant neoplasm of prostate: Secondary | ICD-10-CM

## 2021-06-22 DIAGNOSIS — Z20822 Contact with and (suspected) exposure to covid-19: Secondary | ICD-10-CM | POA: Diagnosis not present

## 2021-06-26 ENCOUNTER — Other Ambulatory Visit (HOSPITAL_COMMUNITY): Payer: Self-pay

## 2021-06-26 ENCOUNTER — Other Ambulatory Visit: Payer: Self-pay | Admitting: Oncology

## 2021-06-26 MED ORDER — ABIRATERONE ACETATE 250 MG PO TABS
1000.0000 mg | ORAL_TABLET | Freq: Every day | ORAL | 0 refills | Status: DC
  Filled 2021-07-03: qty 120, 30d supply, fill #0

## 2021-06-30 ENCOUNTER — Other Ambulatory Visit: Payer: Self-pay

## 2021-06-30 ENCOUNTER — Inpatient Hospital Stay: Payer: Medicare Other | Attending: Oncology

## 2021-06-30 DIAGNOSIS — C7951 Secondary malignant neoplasm of bone: Secondary | ICD-10-CM | POA: Insufficient documentation

## 2021-06-30 DIAGNOSIS — Z9079 Acquired absence of other genital organ(s): Secondary | ICD-10-CM | POA: Diagnosis not present

## 2021-06-30 DIAGNOSIS — Z79899 Other long term (current) drug therapy: Secondary | ICD-10-CM | POA: Diagnosis not present

## 2021-06-30 DIAGNOSIS — C61 Malignant neoplasm of prostate: Secondary | ICD-10-CM | POA: Insufficient documentation

## 2021-06-30 DIAGNOSIS — I10 Essential (primary) hypertension: Secondary | ICD-10-CM | POA: Diagnosis not present

## 2021-06-30 DIAGNOSIS — Z13220 Encounter for screening for lipoid disorders: Secondary | ICD-10-CM

## 2021-06-30 DIAGNOSIS — Z7952 Long term (current) use of systemic steroids: Secondary | ICD-10-CM | POA: Diagnosis not present

## 2021-06-30 LAB — CMP (CANCER CENTER ONLY)
ALT: 14 U/L (ref 0–44)
AST: 17 U/L (ref 15–41)
Albumin: 4.4 g/dL (ref 3.5–5.0)
Alkaline Phosphatase: 44 U/L (ref 38–126)
Anion gap: 7 (ref 5–15)
BUN: 29 mg/dL — ABNORMAL HIGH (ref 8–23)
CO2: 29 mmol/L (ref 22–32)
Calcium: 9.8 mg/dL (ref 8.9–10.3)
Chloride: 103 mmol/L (ref 98–111)
Creatinine: 0.86 mg/dL (ref 0.61–1.24)
GFR, Estimated: >60 mL/min (ref >60)
Glucose, Bld: 116 mg/dL — ABNORMAL HIGH (ref 70–99)
Potassium: 3.5 mmol/L (ref 3.5–5.1)
Sodium: 139 mmol/L (ref 135–145)
Total Bilirubin: 0.9 mg/dL (ref 0.3–1.2)
Total Protein: 6.9 g/dL (ref 6.5–8.1)

## 2021-06-30 LAB — CBC WITH DIFFERENTIAL (CANCER CENTER ONLY)
Abs Immature Granulocytes: 0.02 10*3/uL (ref 0.00–0.07)
Basophils Absolute: 0 10*3/uL (ref 0.0–0.1)
Basophils Relative: 1 %
Eosinophils Absolute: 0.2 10*3/uL (ref 0.0–0.5)
Eosinophils Relative: 4 %
HCT: 40.2 % (ref 39.0–52.0)
Hemoglobin: 13.8 g/dL (ref 13.0–17.0)
Immature Granulocytes: 0 %
Lymphocytes Relative: 31 %
Lymphs Abs: 1.9 10*3/uL (ref 0.7–4.0)
MCH: 31.2 pg (ref 26.0–34.0)
MCHC: 34.3 g/dL (ref 30.0–36.0)
MCV: 91 fL (ref 80.0–100.0)
Monocytes Absolute: 0.6 10*3/uL (ref 0.1–1.0)
Monocytes Relative: 9 %
Neutro Abs: 3.4 10*3/uL (ref 1.7–7.7)
Neutrophils Relative %: 55 %
Platelet Count: 274 10*3/uL (ref 150–400)
RBC: 4.42 MIL/uL (ref 4.22–5.81)
RDW: 12.1 % (ref 11.5–15.5)
WBC Count: 6.1 10*3/uL (ref 4.0–10.5)
nRBC: 0 % (ref 0.0–0.2)

## 2021-06-30 LAB — LIPID PANEL
Cholesterol: 152 mg/dL (ref 0–200)
HDL: 45 mg/dL (ref >40)
LDL Cholesterol: 89 mg/dL (ref 0–99)
Total CHOL/HDL Ratio: 3.4 RATIO
Triglycerides: 88 mg/dL (ref <150)
VLDL: 18 mg/dL (ref 0–40)

## 2021-07-01 LAB — PROSTATE-SPECIFIC AG, SERUM (LABCORP): Prostate Specific Ag, Serum: 6.1 ng/mL — ABNORMAL HIGH (ref 0.0–4.0)

## 2021-07-01 NOTE — Progress Notes (Deleted)
Per Dr. Alen Blew, ok to use labs from 4/18 for pt xgeva injection on 4/20. Calcium 9.8 mg/dL

## 2021-07-02 ENCOUNTER — Other Ambulatory Visit: Payer: Self-pay

## 2021-07-02 ENCOUNTER — Inpatient Hospital Stay: Payer: Medicare Other

## 2021-07-02 ENCOUNTER — Inpatient Hospital Stay (HOSPITAL_BASED_OUTPATIENT_CLINIC_OR_DEPARTMENT_OTHER): Payer: Medicare Other | Admitting: Oncology

## 2021-07-02 VITALS — BP 127/70 | HR 85 | Temp 98.0°F | Resp 16 | Ht 70.0 in | Wt 181.1 lb

## 2021-07-02 DIAGNOSIS — I10 Essential (primary) hypertension: Secondary | ICD-10-CM | POA: Diagnosis not present

## 2021-07-02 DIAGNOSIS — Z7952 Long term (current) use of systemic steroids: Secondary | ICD-10-CM | POA: Diagnosis not present

## 2021-07-02 DIAGNOSIS — Z79899 Other long term (current) drug therapy: Secondary | ICD-10-CM | POA: Diagnosis not present

## 2021-07-02 DIAGNOSIS — C61 Malignant neoplasm of prostate: Secondary | ICD-10-CM

## 2021-07-02 DIAGNOSIS — Z9079 Acquired absence of other genital organ(s): Secondary | ICD-10-CM | POA: Diagnosis not present

## 2021-07-02 DIAGNOSIS — C7951 Secondary malignant neoplasm of bone: Secondary | ICD-10-CM | POA: Diagnosis not present

## 2021-07-02 NOTE — Progress Notes (Signed)
Hematology and Oncology Follow Up Visit ? ?Avish Torry ?[id number] ?05/15/1950 71 y.o. ?07/02/2021 2:19 PM ?[name], MDJones, [name], MD  ? ?Principle Diagnosis: 71 year old man with advanced prostate cancer with disease to the bone and lymphadenopathy diagnosed in 2019.  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  ? ? ?Prior Therapy:  ? ?He is status post orchiectomy completed by Dr. Jeffie Pollock on September 08, 2017. ? ?Current therapy: ? ?Zytiga 1000 mg daily with prednisone started in July 2019. ? ?Xgeva on 120 mg every 8 weeks.   ? ?Interim History: Mr. Altadonna is here for a follow-up visit.  Since last visit, he reports no major changes in his health.  He continues to tolerate current therapy with Zytiga without any complaints.  He denies any nausea, vomiting or abdominal pain.  He denies any excessive fatigue or tiredness.  He has reported some hamstring tenderness on the left side in the morning.  This improves with activity as the day progresses.  He denies any bone pain or pathological fractures.  He denies any hip discomfort.  He continues to exercise regularly. ? ?Medications: Reviewed without changes. ?Current Outpatient Medications  ?Medication Sig Dispense Refill  ? abiraterone acetate (ZYTIGA) 250 MG tablet Take 4 tablets (1,000 mg total) by mouth daily. Take on an empty stomach 1 hour before or 2 hours after a meal 120 tablet 0  ? Calcium Carb-Cholecalciferol (CALTRATE 600+D3 PO) Take 2 tablets by mouth daily.    ? hydrochlorothiazide (MICROZIDE) 12.5 MG capsule TAKE 1 CAPSULE BY MOUTH EVERY DAY 90 capsule 1  ? KLOR-CON M20 20 MEQ tablet TAKE 1 TABLET BY MOUTH EVERY DAY 90 tablet 1  ? Multiple Vitamin (MULTIVITAMIN) tablet Take 1 tablet by mouth daily.    ? predniSONE (DELTASONE) 5 MG tablet TAKE 1 TABLET BY MOUTH EVERY DAY WITH BREAKFAST 90 tablet 3  ? tamsulosin (FLOMAX) 0.4 MG CAPS capsule TAKE 1 CAPSULE (0.4 MG TOTAL) BY MOUTH DAILY AFTER SUPPER. 90  capsule 1  ? valsartan (DIOVAN) 80 MG tablet TAKE 1 TABLET BY MOUTH EVERY DAY 30 tablet 1  ? zolpidem (AMBIEN) 10 MG tablet Take 0.5 tablets (5 mg total) by mouth at bedtime as needed for sleep. 30 tablet 1  ? ?Current Facility-Administered Medications  ?Medication Dose Route Frequency Provider Last Rate Last Admin  ? betamethasone acetate-betamethasone sodium phosphate (CELESTONE) injection 6 mg  6 mg Other Once Evelina Bucy, DPM      ? ? ? ?Allergies: No Known Allergies ? ? ? ? ? ?Physical Exam: ? ? ? ? ? ? ?Blood pressure 127/70, pulse 85, temperature 98 ?F (36.7 ?C), temperature source Temporal, resp. rate 16, height '5\' 10"'$  (1.778 m), weight 181 lb 1.6 oz (82.1 kg), SpO2 99 %. ? ? ? ?ECOG: 0 ? ? ? ?General appearance: Comfortable appearing without any discomfort ?Head: Normocephalic without any trauma ?Oropharynx: Mucous membranes are moist and pink without any thrush or ulcers. ?Eyes: Pupils are equal and round reactive to light. ?Lymph nodes: No cervical, supraclavicular, inguinal or axillary lymphadenopathy.   ?Heart:regular rate and rhythm.  S1 and S2 without leg edema. ?Lung: Clear without any rhonchi or wheezes.  No dullness to percussion. ?Abdomin: Soft, nontender, nondistended with good bowel sounds.  No hepatosplenomegaly. ?Musculoskeletal: No joint deformity or effusion.  Full range of motion noted. ?Neurological: No deficits noted on motor, sensory and deep tendon reflex exam. ?Skin: No petechial rash or dryness.  Appeared moist.  ? ? ? ? ? ? ? ? ? ? ? ? ?.  ? ? ? ? ? ? ? ? ? ? ? ?Lab Results: ?Lab Results  ?Component Value Date  ? WBC 6.1 06/30/2021  ? HGB 13.8 06/30/2021  ? HCT 40.2 06/30/2021  ? MCV 91.0 06/30/2021  ? PLT 274 06/30/2021  ? ?  Chemistry   ?   ?Component Value Date/Time  ? NA 139 06/30/2021 0748  ? K 3.5 06/30/2021 0748  ? CL 103 06/30/2021 0748  ? CO2 29 06/30/2021 0748  ? BUN 29 (H) 06/30/2021 0748  ? BUN 22 (A) 08/25/2020 0000  ? CREATININE 0.86 06/30/2021 0748  ?     ?Component Value Date/Time  ? CALCIUM 9.8 06/30/2021 0748  ? ALKPHOS 44 06/30/2021 0748  ? AST 17 06/30/2021 0748  ? ALT 14 06/30/2021 0748  ? BILITOT 0.9 06/30/2021 0748  ?  ? ? ? Latest Reference Range & Units 01/06/21 08:09 03/03/21 08:20 05/05/21 07:49 06/30/21 07:48  ?Prostate Specific Ag, Serum 0.0 - 4.0 ng/mL 7.1 (H) 6.2 (H) 5.8 (H) 6.1 (H)  ? ? ?  ?Impression and Plan: ? ? 71 year old man with: ?  ?1.  Advanced prostate cancer with disease to the bone and lymphadenopathy diagnosed in 2019.  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 ? ? ?He continues to be on Zytiga without any major complications.  His PSA did show slight increase but overall stable over the last 2 years.  Alternative treatment options including Taxotere chemotherapy and possible PARP inhibitor could be considered if he has castration-resistant disease.  At this time, I have recommended continued treatment with Zytiga and monitor his PSA closely.  We will consider repeating PSMA PET scan if his PSA continues to rise. ? ? ?2.  Androgen deprivation: He is status post orchiectomy without any additional treatment needed. ?  ?3.  Hypertension: Blood pressure remains within normal range and will continue to monitor on Zytiga. ? ?  ?4.    Bone directed therapy: He is currently on Xgeva which she will receive every 2 months.  Risks and benefits were reiterated at this time.  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  He is expecting dental procedure in the near future and I will withhold Xgeva for the time being. ? ?5.  Prognosis and goals of care: Therapy remains palliative though aggressive measures are warranted given his excellent performance status. ? ?6.  Hypokalemia: His potassium is within normal range and we will replace as needed. ? ? ?7.  Follow-up: He will return in 2 months for follow-up visit. ?  ?30 minutes were spent on this encounter.  The time was dedicated to reviewing laboratory data,  disease status update and outlining future plan of care discussion. ? ?Zola Button, MD ?4/20/20232:19 PM ?

## 2021-07-03 ENCOUNTER — Other Ambulatory Visit (HOSPITAL_COMMUNITY): Payer: Self-pay

## 2021-07-12 ENCOUNTER — Other Ambulatory Visit: Payer: Self-pay | Admitting: Oncology

## 2021-07-12 DIAGNOSIS — C61 Malignant neoplasm of prostate: Secondary | ICD-10-CM

## 2021-07-13 ENCOUNTER — Encounter: Payer: Self-pay | Admitting: Oncology

## 2021-07-13 DIAGNOSIS — Z20822 Contact with and (suspected) exposure to covid-19: Secondary | ICD-10-CM | POA: Diagnosis not present

## 2021-07-13 NOTE — Patient Instructions (Addendum)
? ? ? ? ? ?  Have an xray and see Dr Georgina Snell downstairs.  ?

## 2021-07-13 NOTE — Progress Notes (Signed)
? ? ?Subjective:  ? ? Patient ID: Kaliel Bolds, male    DOB: October 05, 1950, 71 y.o.   MRN: [id number] ? ?This visit occurred during the SARS-CoV-2 public health emergency.  Safety protocols were in place, including screening questions prior to the visit, additional usage of staff PPE, and extensive cleaning of exam room while observing appropriate contact time as indicated for disinfecting solutions. ? ? ? ?HPI ?Rafiq is here for  ?Chief Complaint  ?Patient presents with  ? sciatica pain  ?  Left sided sciatica pain that extends down into left leg; numbness in left foot at times  ? ?He has metastatic prostate cancer with mets in pelvic lymph nodes and bone ( calvarium, spine, pelvis, proximal femur).  ? ? ?For the past couple of months he has had pain from his left buttock region down to his posterior knee.  He thought this was a pulled muscle, but it has not been getting better.  He has been stretching.  He does walk daily.  The pain typically starts around 1:00 in the morning-it wakes him up.  If he sleeps in the fetal position it will go away.  He gets up in the morning and there is pain.  He goes walking and it improves and by the afternoon and evening he has no pain. ? ?The past couple of weeks he has started to get some numbness in his left foot-primarily at the bottom of the foot.  He denies any lower back pain.  He has not had sciatica in the past. ? ? ? ? ? ?Medications and allergies reviewed with patient and updated if appropriate. ? ?Current Outpatient Medications on File Prior to Visit  ?Medication Sig Dispense Refill  ? abiraterone acetate (ZYTIGA) 250 MG tablet Take 4 tablets (1,000 mg total) by mouth daily. Take on an empty stomach 1 hour before or 2 hours after a meal 120 tablet 0  ? Calcium Carb-Cholecalciferol (CALTRATE 600+D3 PO) Take 2 tablets by mouth daily.    ? denosumab (XGEVA) 120 MG/1.7ML SOLN injection 1.7 ml    ? hydrochlorothiazide (MICROZIDE) 12.5 MG capsule TAKE 1 CAPSULE BY MOUTH  EVERY DAY 90 capsule 1  ? KLOR-CON M20 20 MEQ tablet TAKE 1 TABLET BY MOUTH EVERY DAY 90 tablet 1  ? Multiple Vitamin (MULTIVITAMIN) tablet Take 1 tablet by mouth daily.    ? predniSONE (DELTASONE) 5 MG tablet TAKE 1 TABLET BY MOUTH EVERY DAY WITH BREAKFAST 90 tablet 3  ? tamsulosin (FLOMAX) 0.4 MG CAPS capsule TAKE 1 CAPSULE (0.4 MG TOTAL) BY MOUTH DAILY AFTER SUPPER. 90 capsule 1  ? valsartan (DIOVAN) 80 MG tablet TAKE 1 TABLET BY MOUTH EVERY DAY 30 tablet 1  ? ?Current Facility-Administered Medications on File Prior to Visit  ?Medication Dose Route Frequency Provider Last Rate Last Admin  ? betamethasone acetate-betamethasone sodium phosphate (CELESTONE) injection 6 mg  6 mg Other Once Evelina Bucy, DPM      ? ? ?Review of Systems ? ?   ?Objective:  ? ?Vitals:  ? 07/14/21 1054  ?BP: 120/82  ?Pulse: 75  ?Temp: 98.2 ?F (36.8 ?C)  ?SpO2: 97%  ? ?BP Readings from Last 3 Encounters:  ?07/14/21 120/82  ?07/14/21 120/82  ?07/02/21 127/70  ? ?Wt Readings from Last 3 Encounters:  ?07/14/21 186 lb 6.4 oz (84.6 kg)  ?07/14/21 186 lb 6.4 oz (84.6 kg)  ?07/02/21 181 lb 1.6 oz (82.1 kg)  ? ?Body mass index is 26.75 kg/m?. ? ?  ?Physical Exam ?  Constitutional:   ?   General: He is not in acute distress. ?   Appearance: Normal appearance.  ?HENT:  ?   Head: Normocephalic.  ?Musculoskeletal:  ?   Comments: No tenderness with palpation of left hamstring, lumbar spine, left buttock region.  Increased pain with raising foot to the other leg and bending down to tie his shoe  ?Skin: ?   General: Skin is warm and dry.  ?Neurological:  ?   Mental Status: He is alert.  ?   Sensory: No sensory deficit.  ?   Motor: No weakness.  ?   Gait: Gait normal.  ?   Comments: Negative left leg straight leg raise  ? ?   ? ? ? ? ? ?Assessment & Plan:  ? ?Left lumbar radiculopathy- ?Subacute-started couple months ago ?Has left buttock to posterior knee pain and tingling in his toes ?I think the symptoms are more likely radiculopathy than a pulled  hamstring ??  Related to metastatic disease in spine versus arthritis, versus piriformis ?X-ray lumbar spine ?We will have him see sports med downstairs today so they can help clarify diagnosis to find the most appropriate treatment ? 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 ?Hypertension: ?Well-controlled ?Continue Diovan 80 mg daily, HCTZ 12.5 mg daily ? ? ? ? ?

## 2021-07-14 ENCOUNTER — Ambulatory Visit (INDEPENDENT_AMBULATORY_CARE_PROVIDER_SITE_OTHER): Payer: Medicare Other | Admitting: Family Medicine

## 2021-07-14 ENCOUNTER — Encounter: Payer: Self-pay | Admitting: Family Medicine

## 2021-07-14 ENCOUNTER — Encounter: Payer: Self-pay | Admitting: Internal Medicine

## 2021-07-14 ENCOUNTER — Ambulatory Visit (INDEPENDENT_AMBULATORY_CARE_PROVIDER_SITE_OTHER): Payer: Medicare Other | Admitting: Internal Medicine

## 2021-07-14 ENCOUNTER — Ambulatory Visit (INDEPENDENT_AMBULATORY_CARE_PROVIDER_SITE_OTHER): Payer: Medicare Other

## 2021-07-14 VITALS — BP 120/82 | HR 75 | Temp 98.2°F | Ht 70.0 in | Wt 186.4 lb

## 2021-07-14 VITALS — BP 120/82 | HR 75 | Ht 70.0 in | Wt 186.4 lb

## 2021-07-14 DIAGNOSIS — M79605 Pain in left leg: Secondary | ICD-10-CM

## 2021-07-14 DIAGNOSIS — I10 Essential (primary) hypertension: Secondary | ICD-10-CM | POA: Diagnosis not present

## 2021-07-14 DIAGNOSIS — M5416 Radiculopathy, lumbar region: Secondary | ICD-10-CM

## 2021-07-14 DIAGNOSIS — F5104 Psychophysiologic insomnia: Secondary | ICD-10-CM | POA: Diagnosis not present

## 2021-07-14 DIAGNOSIS — M545 Low back pain, unspecified: Secondary | ICD-10-CM | POA: Diagnosis not present

## 2021-07-14 MED ORDER — ZOLPIDEM TARTRATE 10 MG PO TABS: 5.0000 mg | ORAL_TABLET | Freq: Every evening | ORAL | 1 refills | Status: DC | PRN

## 2021-07-14 MED ORDER — GABAPENTIN 300 MG PO CAPS: 300.0000 mg | ORAL_CAPSULE | Freq: Two times a day (BID) | ORAL | 3 refills | Status: DC | PRN

## 2021-07-14 NOTE — Progress Notes (Signed)
? ? ?Subjective:   ? ?CC: L leg pain ? ?I, Wendy Poet, LAT, ATC, am serving as scribe for Dr. Lynne Leader. ? ?HPI: Pt is a 71 y/o male presenting w/ c/o L leg pain/lumbar radiculopathy x 2 months w/ no known MOI.  He locates his pain to his L glute down to the L post knee.  Of note, pt is currently being treated for metastatic prostate CA. ? ?Low back pain: no ?Radiating pain: yes from L buttock down post thigh to knee ?L LE paresthesias: yes, numbness in L plantar foot (newer symptom over last few days) ?Aggravating factors: wakes him up in the middle of the night at 1am; transitioning from supine to stand; trying to put his socks on ?Treatments tried:  stretching ? ?Diagnostic testing: L-spine XR- 07/14/21 ? ?Pertinent review of Systems: No fevers or chills ? ?Relevant historical information: Metastatic prostate cancer ? ? ?Objective:   ? ?Vitals:  ? 07/14/21 1129  ?BP: 120/82  ?Pulse: 75  ?SpO2: 97%  ? ?General: Well Developed, well nourished, and in no acute distress.  ? ?MSK: L-spine normal. ?Nontender midline. ?Normal lumbar motion. ?Negative slump test and straight leg raise test. ?Lower extremity strength is intact ?Reflexes are intact. ? ?Left hip and thigh normal. ?Nontender.  No palpable defects or tenderness into the posterior thigh. ?Normal hip and thigh motion and strength. ?Some pain with resisted hip extension present. ?Negative H test. ? ?Lab and Radiology Results ? ?X-ray images L-spine and left femur obtained today personally and independently interpreted ? ?L-spine: DDD L5-S1 and L4-L5.  No acute fractures are visible. ? ?Left femur: No obvious bony erosions within the femur.  No fractures are visible. ? ?Await formal radiology review ? ?  ?EXAM: ?NUCLEAR MEDICINE PET SKULL BASE TO THIGH ?  ?TECHNIQUE: ?8.7 mCi F18 Piflufolastat (Pylarify) was injected intravenously. ?Full-ring PET imaging was performed from the skull base to thigh ?after the radiotracer. CT data was obtained and used for  attenuation ?correction and anatomic localization. ?  ?COMPARISON:  Bone scan 08/01/2019, CT 08/01/2019 ?  ?FINDINGS: ?NECK ?  ?No radiotracer activity in neck lymph nodes. ?  ?Incidental CT finding: None ?  ?CHEST ?  ?No radiotracer accumulation within mediastinal or hilar lymph nodes. ?No suspicious pulmonary nodules on the CT scan. ?  ?Incidental CT finding: None ?  ?ABDOMEN/PELVIS ?  ?Prostate: Focal lesion just LEFT of midline in the mid prostate ?gland with SUV max equal 29.1 (image 220) ?  ?Lymph nodes: Intense radiotracer avid small external and common ?iliac lymph nodes. ?  ?For example LEFT operator node measuring 6 mm (image 206/CT series ?4) with SUV max equal 33.2. ?  ?RIGHT common iliac node measuring only 5 mm (image 190/CT series 4) ?with SUV max equal 31.8. ?  ?No periaortic retroperitoneal radiotracer avid nodes. ?  ?Highest metastatic nodes at the aortic bifurcation SUV max equal ?17.3 on image 190. ?  ?Liver: No evidence of liver metastasis ?  ?Incidental CT finding: None ?  ?SKELETON ?  ?Focal lesion in the vertex of the skull with SUV max equal 13.3. ?Small sclerotic lesion on CT portion exam. (Image 6) this calvarial ?lesion is not appreciated on most recent bone scan (08/01/2019) ?  ?Lesion in the spinous process of the T7 vertebral body with SUV max ?equal 19.5 is new from comparison bone scan. ?  ?Intense activity in at the T5 vertebral body associated with ?sclerosis of and SUV max equal 113. This is site of  metastatic ?disease on comparison bone scan. T ?  ?There are approximately 6 additional small radiotracer avid ?metastatic sites within the lumbar and thoracic spine. ?  ?Broad lesion in the LEFT ischium with associated sclerosis similar ?prior. New lesion in the proximal RIGHT femur SUV max equal 6.8. ?  ?IMPRESSION: ?1. Focal lesion just LEFT of midline within the LEFT lobe of the ?prostate gland concerning for local prostate cancer recurrence. ?2. Intensely radiotracer avid small  external and common iliac lymph ?nodes consistent with local nodal metastasis in the pelvis. ?3. Progression skeletal metastasis with several new lesions in the ?calvarium, spine, pelvis and proximal femur. ?  ?  ?Electronically Signed ?  By: Suzy Bouchard M.D. ?  On: 04/18/2021 09:06 ? ?Impression and Recommendations:   ? ?Assessment and Plan: ?71 y.o. male with left leg pain with possible radicular symptoms.  Etiology is somewhat unclear.  His symptoms are most consistent with either hamstring muscle dysfunction or lumbar radiculopathy.  He has components that are consistent with both.  Plan to treat for both with hamstring strengthening exercises taught in clinic today.  We will proceed with [place] L protocol.  Also use gabapentin especially at bedtime as that should be helpful for his pain especially around sleep thought to be potentially lumbar radiculopathy.  Backup plan could include muscle relaxers at bedtime or even a burst of prednisone. ? ?However given his metastatic prostate cancer history advanced imaging is probably warranted earlier than otherwise would be indicated.  Consider lumbar MRI based on x-ray results over read with radiology.. ? ?Check in 1 month. ? ?PDMP not reviewed this encounter. ?Orders Placed This Encounter  ?Procedures  ? DG Lumbar Spine Complete  ?  Standing Status:   Future  ?  Number of Occurrences:   1  ?  Standing Expiration Date:   08/14/2021  ?  Order Specific Question:   Reason for Exam (SYMPTOM  OR DIAGNOSIS REQUIRED)  ?  Answer:   L leg pain  ?  Order Specific Question:   Preferred imaging location?  ?  Answer:   [facility]  ? DG FEMUR MIN 2 VIEWS LEFT  ?  Standing Status:   Future  ?  Number of Occurrences:   1  ?  Standing Expiration Date:   07/15/2022  ?  Order Specific Question:   Reason for Exam (SYMPTOM  OR DIAGNOSIS REQUIRED)  ?  Answer:   eval left left pain in th setting of prostate cancer  ?  Order Specific Question:   Preferred imaging location?  ?   Answer:   [facility]  ? ?Meds ordered this encounter  ?Medications  ? gabapentin (NEURONTIN) 300 MG capsule  ?  Sig: Take 1 capsule (300 mg total) by mouth 3 times/day as needed-between meals & bedtime (nerve pain).  ?  Dispense:  30 capsule  ?  Refill:  3  ? ? ?Discussed warning signs or symptoms. Please see discharge instructions. Patient expresses understanding. ? ? ?The above documentation has been reviewed and is accurate and complete Lynne Leader, M.D. ? ?

## 2021-07-14 NOTE — Patient Instructions (Addendum)
Nice to meet you today. ? ?Please get an Xray today before you leave. ? ?The [place] L Protocol for Hamstring Strains  Lyman Bishop PT ? ?Follow-up: 1 month.  ? ?Anticipate MRI soon. I will see based on the Xray. I will also double check with the radiologist the best way to do the MRI.  ? ? ? ?

## 2021-07-15 ENCOUNTER — Encounter: Payer: Self-pay | Admitting: Family Medicine

## 2021-07-15 NOTE — Progress Notes (Signed)
Lumbar spine x-ray shows arthritis changes but no fractures or new findings.

## 2021-07-15 NOTE — Progress Notes (Signed)
On the femur x-ray of the femur itself looks okay but we can see a osseous lesion at the pubic ramus.  There is still this on a CT scan previously and probably represents a metastasis to the bone.  No new or acute findings on the femur x-ray.

## 2021-07-22 ENCOUNTER — Other Ambulatory Visit: Payer: Self-pay | Admitting: Oncology

## 2021-07-25 ENCOUNTER — Encounter: Payer: Self-pay | Admitting: Family Medicine

## 2021-07-28 ENCOUNTER — Other Ambulatory Visit: Payer: Self-pay | Admitting: Oncology

## 2021-07-28 ENCOUNTER — Other Ambulatory Visit (HOSPITAL_COMMUNITY): Payer: Self-pay

## 2021-07-28 ENCOUNTER — Encounter: Payer: Self-pay | Admitting: Oncology

## 2021-07-28 MED ORDER — ABIRATERONE ACETATE 250 MG PO TABS
1000.0000 mg | ORAL_TABLET | Freq: Every day | ORAL | 0 refills | Status: DC
Start: 2021-07-28 — End: 2021-08-20
  Filled 2021-07-28: qty 120, 30d supply, fill #0

## 2021-07-29 ENCOUNTER — Encounter: Payer: Self-pay | Admitting: Oncology

## 2021-08-03 ENCOUNTER — Other Ambulatory Visit (HOSPITAL_COMMUNITY): Payer: Self-pay

## 2021-08-05 DIAGNOSIS — Z20828 Contact with and (suspected) exposure to other viral communicable diseases: Secondary | ICD-10-CM | POA: Diagnosis not present

## 2021-08-08 ENCOUNTER — Other Ambulatory Visit: Payer: Self-pay | Admitting: Oncology

## 2021-08-08 ENCOUNTER — Encounter: Payer: Self-pay | Admitting: Oncology

## 2021-08-08 DIAGNOSIS — C61 Malignant neoplasm of prostate: Secondary | ICD-10-CM

## 2021-08-11 ENCOUNTER — Encounter: Payer: Self-pay | Admitting: Oncology

## 2021-08-11 DIAGNOSIS — Z20828 Contact with and (suspected) exposure to other viral communicable diseases: Secondary | ICD-10-CM | POA: Diagnosis not present

## 2021-08-12 ENCOUNTER — Inpatient Hospital Stay: Payer: Medicare Other | Attending: Oncology

## 2021-08-12 ENCOUNTER — Encounter: Payer: Self-pay | Admitting: Oncology

## 2021-08-12 ENCOUNTER — Other Ambulatory Visit: Payer: Self-pay

## 2021-08-12 DIAGNOSIS — Z7951 Long term (current) use of inhaled steroids: Secondary | ICD-10-CM | POA: Insufficient documentation

## 2021-08-12 DIAGNOSIS — Z79899 Other long term (current) drug therapy: Secondary | ICD-10-CM | POA: Diagnosis not present

## 2021-08-12 DIAGNOSIS — I10 Essential (primary) hypertension: Secondary | ICD-10-CM | POA: Diagnosis not present

## 2021-08-12 DIAGNOSIS — C61 Malignant neoplasm of prostate: Secondary | ICD-10-CM | POA: Diagnosis not present

## 2021-08-12 DIAGNOSIS — E876 Hypokalemia: Secondary | ICD-10-CM | POA: Diagnosis not present

## 2021-08-12 DIAGNOSIS — C7951 Secondary malignant neoplasm of bone: Secondary | ICD-10-CM | POA: Diagnosis present

## 2021-08-12 LAB — CMP (CANCER CENTER ONLY)
ALT: 15 U/L (ref 0–44)
AST: 20 U/L (ref 15–41)
Albumin: 4.5 g/dL (ref 3.5–5.0)
Alkaline Phosphatase: 47 U/L (ref 38–126)
Anion gap: 5 (ref 5–15)
BUN: 22 mg/dL (ref 8–23)
CO2: 28 mmol/L (ref 22–32)
Calcium: 10 mg/dL (ref 8.9–10.3)
Chloride: 105 mmol/L (ref 98–111)
Creatinine: 0.77 mg/dL (ref 0.61–1.24)
GFR, Estimated: >60 mL/min (ref >60)
Glucose, Bld: 116 mg/dL — ABNORMAL HIGH (ref 70–99)
Potassium: 4.2 mmol/L (ref 3.5–5.1)
Sodium: 138 mmol/L (ref 135–145)
Total Bilirubin: 0.8 mg/dL (ref 0.3–1.2)
Total Protein: 6.8 g/dL (ref 6.5–8.1)

## 2021-08-12 LAB — CBC WITH DIFFERENTIAL (CANCER CENTER ONLY)
Abs Immature Granulocytes: 0.01 10*3/uL (ref 0.00–0.07)
Basophils Absolute: 0 10*3/uL (ref 0.0–0.1)
Basophils Relative: 0 %
Eosinophils Absolute: 0.1 10*3/uL (ref 0.0–0.5)
Eosinophils Relative: 1 %
HCT: 39.2 % (ref 39.0–52.0)
Hemoglobin: 13.7 g/dL (ref 13.0–17.0)
Immature Granulocytes: 0 %
Lymphocytes Relative: 15 %
Lymphs Abs: 1.2 10*3/uL (ref 0.7–4.0)
MCH: 31.4 pg (ref 26.0–34.0)
MCHC: 34.9 g/dL (ref 30.0–36.0)
MCV: 89.9 fL (ref 80.0–100.0)
Monocytes Absolute: 0.5 10*3/uL (ref 0.1–1.0)
Monocytes Relative: 6 %
Neutro Abs: 6.4 10*3/uL (ref 1.7–7.7)
Neutrophils Relative %: 78 %
Platelet Count: 272 10*3/uL (ref 150–400)
RBC: 4.36 MIL/uL (ref 4.22–5.81)
RDW: 11.8 % (ref 11.5–15.5)
WBC Count: 8.1 10*3/uL (ref 4.0–10.5)
nRBC: 0 % (ref 0.0–0.2)

## 2021-08-13 ENCOUNTER — Encounter: Payer: Self-pay | Admitting: Oncology

## 2021-08-13 LAB — PROSTATE-SPECIFIC AG, SERUM (LABCORP): Prostate Specific Ag, Serum: 5.1 ng/mL — ABNORMAL HIGH (ref 0.0–4.0)

## 2021-08-14 ENCOUNTER — Encounter: Payer: Self-pay | Admitting: Family Medicine

## 2021-08-14 ENCOUNTER — Ambulatory Visit (INDEPENDENT_AMBULATORY_CARE_PROVIDER_SITE_OTHER): Payer: Medicare Other | Admitting: Family Medicine

## 2021-08-14 VITALS — BP 118/82 | HR 83 | Ht 70.0 in | Wt 180.6 lb

## 2021-08-14 DIAGNOSIS — M5416 Radiculopathy, lumbar region: Secondary | ICD-10-CM | POA: Diagnosis not present

## 2021-08-14 DIAGNOSIS — C61 Malignant neoplasm of prostate: Secondary | ICD-10-CM | POA: Diagnosis not present

## 2021-08-14 DIAGNOSIS — M79605 Pain in left leg: Secondary | ICD-10-CM

## 2021-08-14 NOTE — Progress Notes (Signed)
I, Peterson Lombard, LAT, ATC acting as a scribe for Lynne Leader, MD.  Ethan [name] is a 71 y.o. male who presents to [place] at [facility] today for f/u L leg pain thought to be either hamstring muscle dysfunction or lumbar radiculopathy. Pt was last seen by Dr. Georgina Snell on 07/14/21 and was taught [place] L protocol and prescribed Gabapentin. Pt has a hx of metastatic prostate cancer. Today, pt reports L leg pain has continued and will have worsening pain during the night when trying to sleep. Pt has continued to go for a walk in the mornings. Pt locates pain to the L buttock w/ radiating pain in the posterior thigh and entire lower leg.   Dx imaging: 07/14/21 L-spine & L femur XR  Pertinent review of systems: No fevers or chills  Relevant historical information: Metastatic prostate cancer.   Exam:  BP 118/82   Pulse 83   Ht '5\' 10"'$  (1.778 m)   Wt 180 lb 9.6 oz (81.9 kg)   SpO2 96%   BMI 25.91 kg/m  General: Well Developed, well nourished, and in no acute distress.   MSK: Normal gait.  Normal lumbar motion.    Lab and Radiology Results  DG Lumbar Spine Complete  Result Date: 07/14/2021 CLINICAL DATA:  Leg pain. EXAM: LUMBAR SPINE - COMPLETE 4+ VIEW COMPARISON:  CT abdomen and pelvis 08/01/2019. FINDINGS: There is no acute fracture. Alignment is anatomic. Trace anterior wedge deformity of L1 and T12 appears unchanged. There is moderate disc space narrowing at L4-L5 and L5-S1 with endplate osteophyte formation similar to prior study. There are facet degenerative changes at L5-S1, also similar to prior study. There are atherosclerotic calcifications of the aorta. IMPRESSION: 1. No acute bony abnormality. 2. Stable degenerative changes of the lower lumbar spine. Electronically Signed   By: Ronney Asters M.D.   On: 07/14/2021 22:03   DG FEMUR MIN 2 VIEWS LEFT  Result Date: 07/14/2021 CLINICAL DATA:  Left leg pain.  Prostate cancer. EXAM: LEFT FEMUR 2 VIEWS COMPARISON:   PET-CT 04/16/2021. FINDINGS: There is no evidence for acute fracture or dislocation. Joint spaces are well maintained. Sclerotic osseous lesion in the left inferior pubic ramus appears similar to prior CT. No other focal osseous lesions are identified. Soft tissues are within normal limits. IMPRESSION: 1. Left femur appears within normal limits. 2. Stable sclerotic osseous lesion in the left inferior pubic ramus. Electronically Signed   By: Ronney Asters M.D.   On: 07/14/2021 22:05    I, Lynne Leader, personally (independently) visualized and performed the interpretation of the images attached in this note.  Lab Results  Component Value Date   PSA1 5.1 (H) 08/12/2021   PSA1 6.1 (H) 06/30/2021   PSA1 5.8 (H) 05/05/2021   PSA 9.00 08/25/2020         Assessment and Plan: 71 y.o. male with left proximal buttocks pain with pain radiating to the posterior thigh.  This is concerning for insertional hamstring tendinopathy or lumbar sacral radiculopathy.  Based on his PET scan from February he does have what appears to be a prostate cancer metastasis in the ischial tuberosity area and that could be the source of his pain.  He has failed to improve with conservative management.  Given his prostate cancer history and significant pain and failure to improve we will proceed to MRI of the lumbar spine and hip to evaluate source of pain more accurately.  I did discuss with radiology and we do not  think he needs contrast with the MRI which reduced the amount of time he needs to spend in the MRI scanner.  Recheck after MRI.    PDMP not reviewed this encounter. Orders Placed This Encounter  Procedures   MR HIP LEFT WO CONTRAST    Spoke with radiology.  We do not think IV contrast is necessary.    Standing Status:   Future    Standing Expiration Date:   08/15/2022    Order Specific Question:   What is the patient's sedation requirement?    Answer:   No Sedation    Order Specific Question:   Does the  patient have a pacemaker or implanted devices?    Answer:   No    Order Specific Question:   Preferred imaging location?    Answer:   Product/process development scientist (table limit-350lbs)   MR Lumbar Spine Wo Contrast    Spoke with radiology.  We do not think contrast is necessary.    Standing Status:   Future    Standing Expiration Date:   08/15/2022    Order Specific Question:   What is the patient's sedation requirement?    Answer:   No Sedation    Order Specific Question:   Does the patient have a pacemaker or implanted devices?    Answer:   No    Order Specific Question:   Preferred imaging location?    Answer:   Product/process development scientist (table limit-350lbs)   No orders of the defined types were placed in this encounter.    Discussed warning signs or symptoms. Please see discharge instructions. Patient expresses understanding.   The above documentation has been reviewed and is accurate and complete Lynne Leader, M.D.

## 2021-08-14 NOTE — Telephone Encounter (Signed)
Helen sent Team's message to make her aware this order is placed and if it could be done sooner rather than later given the pt's hx.

## 2021-08-14 NOTE — Patient Instructions (Addendum)
Thank you for coming in today.   You should hear from MRI scheduling within 1 week. If you do not hear please let me know.   Check back after MRI  

## 2021-08-16 ENCOUNTER — Encounter: Payer: Self-pay | Admitting: Internal Medicine

## 2021-08-19 ENCOUNTER — Encounter: Payer: Self-pay | Admitting: Family Medicine

## 2021-08-20 ENCOUNTER — Other Ambulatory Visit (HOSPITAL_COMMUNITY): Payer: Self-pay

## 2021-08-20 ENCOUNTER — Other Ambulatory Visit: Payer: Self-pay | Admitting: Oncology

## 2021-08-20 MED ORDER — ABIRATERONE ACETATE 250 MG PO TABS
1000.0000 mg | ORAL_TABLET | Freq: Every day | ORAL | 0 refills | Status: DC
  Filled 2021-08-20 – 2021-08-24 (×2): qty 120, 30d supply, fill #0

## 2021-08-21 ENCOUNTER — Other Ambulatory Visit (HOSPITAL_COMMUNITY): Payer: Self-pay

## 2021-08-23 ENCOUNTER — Ambulatory Visit (INDEPENDENT_AMBULATORY_CARE_PROVIDER_SITE_OTHER): Payer: Medicare Other

## 2021-08-23 DIAGNOSIS — M25552 Pain in left hip: Secondary | ICD-10-CM | POA: Diagnosis not present

## 2021-08-23 DIAGNOSIS — M5416 Radiculopathy, lumbar region: Secondary | ICD-10-CM

## 2021-08-23 DIAGNOSIS — C61 Malignant neoplasm of prostate: Secondary | ICD-10-CM | POA: Diagnosis not present

## 2021-08-23 DIAGNOSIS — C7951 Secondary malignant neoplasm of bone: Secondary | ICD-10-CM | POA: Diagnosis not present

## 2021-08-23 DIAGNOSIS — M79605 Pain in left leg: Secondary | ICD-10-CM | POA: Diagnosis not present

## 2021-08-23 DIAGNOSIS — Z8546 Personal history of malignant neoplasm of prostate: Secondary | ICD-10-CM

## 2021-08-24 ENCOUNTER — Encounter: Payer: Self-pay | Admitting: Oncology

## 2021-08-24 ENCOUNTER — Other Ambulatory Visit (HOSPITAL_COMMUNITY): Payer: Self-pay

## 2021-08-24 LAB — GUARDANT 360

## 2021-08-25 ENCOUNTER — Other Ambulatory Visit (HOSPITAL_COMMUNITY): Payer: Self-pay

## 2021-08-25 ENCOUNTER — Telehealth: Payer: Self-pay

## 2021-08-25 NOTE — Telephone Encounter (Addendum)
Oral Chemotherapy Pharmacist Encounter  Spoke to patient regarding cost of Zytiga medication. Original discussion on 08/08/21 was directed to our IV pharmacy team resulting in the oral chemotherapy team getting the message on 08/24/21.   Patient had signed up for [employer] cost plus drugs program as he only has one more fill through [facility] to use his grant money.   Patient has been notified that we are able to do the same price of $140 for a months supply of the Zytiga at [facility]. Patient agrees to be able to continue receiving the medication through [place] at the reduced cash price.   Drema Halon, PharmD Hematology/Oncology Clinical Pharmacist [facility] [phone]

## 2021-08-26 ENCOUNTER — Ambulatory Visit (INDEPENDENT_AMBULATORY_CARE_PROVIDER_SITE_OTHER): Payer: Medicare Other | Admitting: Family Medicine

## 2021-08-26 VITALS — BP 132/88 | HR 58 | Ht 70.0 in | Wt 180.2 lb

## 2021-08-26 DIAGNOSIS — M5416 Radiculopathy, lumbar region: Secondary | ICD-10-CM | POA: Diagnosis not present

## 2021-08-26 DIAGNOSIS — M79605 Pain in left leg: Secondary | ICD-10-CM

## 2021-08-26 DIAGNOSIS — C61 Malignant neoplasm of prostate: Secondary | ICD-10-CM

## 2021-08-26 MED ORDER — PREGABALIN 75 MG PO CAPS: 75.0000 mg | ORAL_CAPSULE | Freq: Two times a day (BID) | ORAL | 3 refills | Status: DC | PRN

## 2021-08-26 NOTE — Progress Notes (Signed)
Lumbar spine MRI does show the potential for pinched nerves in your back that potentially could cause some of your pain.  Recommend return to clinic to talk about the results in full detail.  We may consider a back injection here.

## 2021-08-26 NOTE — Patient Instructions (Addendum)
Thank you for coming in today.   Please call [facility] at [phone] to schedule your spine injection.    Let me know how you feel after.

## 2021-08-26 NOTE — Progress Notes (Signed)
The MRI of the hip does show metastasis in the ischium which is where the hamstring originates from.  However it looks stable compared to PET CT scan from 4 months ago.  There is no fractures around there.  The hamstring tendon itself looks normal to radiology.  Recommend return to clinic to discuss treatment plan and options from here.

## 2021-08-26 NOTE — Progress Notes (Signed)
I, Wendy Poet, LAT, ATC, am serving as scribe for Dr. Lynne Leader.  Ethan [name] is a 71 y.o. male who presents to [place] at [facility] today for f/u of L leg pain thought to be due to lumbar radiculopathy and to review his L hip and L-spine MRI results.  Pt has a hx of metastatic prostate cancer. He was last seen by Dr. Georgina Snell on 08/14/21 and noted worsening L post thigh/leg and buttock pain at night and was referred for a L hip and L-spine MRI.  Today, pt reports he pain is the same and he's not doing well. Pt c/o pain keeping him awake at night.  Diagnostic testing: L hip and L-spine MRI- 08/23/21; L femur and L-spine XR- 07/14/21  Pertinent review of systems: No fevers or chills  Relevant historical information: Metastatic prostate cancer.   Exam:  BP 132/88   Pulse (!) 58   Ht '5\' 10"'$  (1.778 m)   Wt 180 lb 3.2 oz (81.7 kg)   SpO2 97%   BMI 25.86 kg/m  General: Well Developed, well nourished, and in no acute distress.   MSK: L-spine decreased lumbar motion.  Intact strength.    Lab and Radiology Results No results found for this or any previous visit (from the past 72 hour(s)). MR HIP LEFT WO CONTRAST  Result Date: 08/25/2021 CLINICAL DATA:  Left hip pain for 3 months. No known injury. History of prostate cancer. Pain localized at the hamstring origin. EXAM: MR OF THE LEFT HIP WITHOUT CONTRAST TECHNIQUE: Multiplanar, multisequence MR imaging was performed. No intravenous contrast was administered. COMPARISON:  Left femur radiographs 07/14/2021. MRI of the lumbar spine 08/23/2021. PET-CT 04/16/2021. FINDINGS: Bones: Known metastatic disease involving the left ischium and inferior pubic ramus appears unchanged. No progressive osseous metastases are identified. There is no evidence of acute fracture or dislocation. Lower lumbar spondylosis is noted. Lumbar spine details are dictated separately. The visualized sacroiliac joints and symphysis pubis appear normal.  Articular cartilage and labrum Articular cartilage: No focal chondral defect or subchondral signal abnormality identified. Labrum: There is no gross labral tear or paralabral abnormality. Joint or bursal effusion Joint effusion: No significant hip joint effusion. Bursae: No focal periarticular fluid collection. Muscles and tendons Muscles and tendons: The visualized gluteus, hamstring and iliopsoas tendons appear normal. The piriformis muscles appear symmetric. Other findings Miscellaneous: No focal prostatic abnormality or residual pelvic adenopathy demonstrated. IMPRESSION: 1. Grossly stable metastatic disease involving the left ischium and inferior pubic ramus compared with prior PET-CT of 4 months ago. No progressive osseous metastases or pathologic fractures identified. 2. No acute osseous findings or significant arthropathic changes at either hip. 3. No significant pelvic musculotendinous abnormalities. Electronically Signed   By: Richardean Sale M.D.   On: 08/25/2021 12:11   MR Lumbar Spine Wo Contrast  Result Date: 08/25/2021 CLINICAL DATA:  Lumbar radiculopathy, symptoms persist with greater than 6 weeks of treatment. Metastatic prostate cancer. EXAM: MRI LUMBAR SPINE WITHOUT CONTRAST TECHNIQUE: Multiplanar, multisequence MR imaging of the lumbar spine was performed. No intravenous contrast was administered. COMPARISON:  Radiography 07/14/2021 FINDINGS: Segmentation:  5 lumbar type vertebral bodies. Alignment:  Straightening of the normal lumbar lordosis. Vertebrae: No evidence metastatic disease to the bone. Chronic endplate Schmorl's nodes, most prominent at superior endplate L3. Discogenic endplate changes at L8-9 without active edema. Conus medullaris and cauda equina: Conus extends to the L1 level. Conus and cauda equina appear normal. Paraspinal and other soft tissues: Negative Disc levels: T11-12, T12-L1  and L1-2: Normal L2-3: Mild bulging of the disc.  No compressive stenosis. L3-4: Moderate  bulging of the disc. Mild facet and ligamentous hypertrophy. Mild narrowing of the lateral recesses without visible neural compression. L4-5: Endplate osteophytes and moderate bulging of the disc. Facet and ligamentous hypertrophy. Moderate multifactorial stenosis of the canal, lateral recesses and neural foramina which could cause neural compression on either or both sides. Findings are slightly worse on the right. L5-S1: Broad-based disc herniation more prominent in the left posterolateral direction. Facet degeneration with facet and ligamentous hypertrophy. Stenosis of the lateral recesses left worse than right. Left S1 nerve compression is likely. Foraminal stenosis on the left could also affect the exiting L5 nerve. IMPRESSION: No evidence of regional metastatic disease. L3-4: Disc bulge. Mild facet hypertrophy. Mild lateral recess stenosis without visible neural compression. L4-5: Moderate multifactorial stenosis of the canal, lateral recesses and foramina that could cause neural compression on either or both sides, somewhat worse on the right. L5-S1: Broad-based disc herniation more prominent in the left posterolateral direction. Lateral recess stenosis left worse than right. Left S1 nerve compression is likely. There is also foraminal narrowing left worse than right with some potential to affect the left L5 nerve. Electronically Signed   By: Nelson Chimes M.D.   On: 08/25/2021 09:14    I, Lynne Leader, personally (independently) visualized and performed the interpretation of the images attached in this note.    Assessment and Plan: 71 y.o. male with left buttocks pain pain radiating down the posterior thigh and now into the calf.  Based on lumbar and hip MRI symptoms very likely to be S1 radiculopathy.  Plan for epidural steroid injection.  We will switch from gabapentin to Lyrica as gabapentin has not been very helpful.   PDMP reviewed during this encounter. Orders Placed This Encounter  Procedures    DG INJECT DIAG/THERA/INC NEEDLE/CATH/PLC EPI/LUMB/SAC W/IMG    Level and technique per radiology LUMB EPI #1 MCR PACS (08/23/21) 180 LBS     Standing Status:   Future    Standing Expiration Date:   09/25/2021    Order Specific Question:   Reason for Exam (SYMPTOM  OR DIAGNOSIS REQUIRED)    Answer:   Low back pain    Order Specific Question:   Preferred Imaging Location?    Answer:   [id number]   Meds ordered this encounter  Medications   pregabalin (LYRICA) 75 MG capsule    Sig: Take 1 capsule (75 mg total) by mouth 2 (two) times daily as needed.    Dispense:  60 capsule    Refill:  3     Discussed warning signs or symptoms. Please see discharge instructions. Patient expresses understanding.   The above documentation has been reviewed and is accurate and complete Lynne Leader, M.D.  Total encounter time 30 minutes including face-to-face time with the patient and, reviewing past medical record, and charting on the date of service.   Reviewed MRI discussed treatment plan and options and talked about medication management.

## 2021-08-27 ENCOUNTER — Inpatient Hospital Stay: Payer: Medicare Other | Admitting: Oncology

## 2021-08-27 ENCOUNTER — Other Ambulatory Visit (HOSPITAL_COMMUNITY): Payer: Self-pay

## 2021-08-27 ENCOUNTER — Inpatient Hospital Stay: Payer: Medicare Other

## 2021-08-28 ENCOUNTER — Ambulatory Visit
Admission: RE | Admit: 2021-08-28 | Discharge: 2021-08-28 | Disposition: A | Discharge status: Home / Self Care | Payer: Medicare Other | Source: Ambulatory Visit | Attending: Family Medicine | Admitting: Family Medicine

## 2021-08-28 ENCOUNTER — Encounter: Payer: Self-pay | Admitting: Family Medicine

## 2021-08-28 DIAGNOSIS — M5416 Radiculopathy, lumbar region: Secondary | ICD-10-CM | POA: Diagnosis not present

## 2021-08-28 MED ORDER — METHYLPREDNISOLONE ACETATE 40 MG/ML INJ SUSP (RADIOLOG
80.0000 mg | Freq: Once | INTRAMUSCULAR | Status: AC
  Administered 2021-08-28: 80 mg via EPIDURAL

## 2021-08-28 MED ORDER — PREGABALIN 75 MG PO CAPS: 75.0000 mg | ORAL_CAPSULE | Freq: Two times a day (BID) | ORAL | 3 refills | Status: DC | PRN

## 2021-08-28 MED ORDER — IOPAMIDOL (ISOVUE-M 200) INJECTION 41%
1.0000 mL | Freq: Once | INTRAMUSCULAR | Status: AC
  Administered 2021-08-28: 1 mL via EPIDURAL

## 2021-08-28 NOTE — Discharge Instructions (Signed)

## 2021-09-01 ENCOUNTER — Inpatient Hospital Stay: Payer: Medicare Other

## 2021-09-01 ENCOUNTER — Other Ambulatory Visit: Payer: Self-pay

## 2021-09-01 ENCOUNTER — Inpatient Hospital Stay: Payer: Medicare Other | Attending: Oncology | Admitting: Oncology

## 2021-09-01 ENCOUNTER — Other Ambulatory Visit (HOSPITAL_COMMUNITY): Payer: Self-pay

## 2021-09-01 VITALS — BP 126/83 | HR 93 | Temp 98.5°F | Resp 18

## 2021-09-01 VITALS — BP 113/72 | HR 84 | Temp 98.2°F | Resp 17 | Ht 70.0 in | Wt 174.8 lb

## 2021-09-01 DIAGNOSIS — E876 Hypokalemia: Secondary | ICD-10-CM | POA: Diagnosis not present

## 2021-09-01 DIAGNOSIS — C61 Malignant neoplasm of prostate: Secondary | ICD-10-CM

## 2021-09-01 DIAGNOSIS — C7951 Secondary malignant neoplasm of bone: Secondary | ICD-10-CM | POA: Insufficient documentation

## 2021-09-01 DIAGNOSIS — Z9079 Acquired absence of other genital organ(s): Secondary | ICD-10-CM | POA: Insufficient documentation

## 2021-09-01 DIAGNOSIS — Z79899 Other long term (current) drug therapy: Secondary | ICD-10-CM | POA: Insufficient documentation

## 2021-09-01 MED ORDER — DENOSUMAB 120 MG/1.7ML ~~LOC~~ SOLN
120.0000 mg | Freq: Once | SUBCUTANEOUS | Status: AC
  Administered 2021-09-01: 120 mg via SUBCUTANEOUS
  Filled 2021-09-01: qty 1.7

## 2021-09-01 NOTE — Progress Notes (Signed)
Hematology and Oncology Follow Up Visit  Ethan [name] [id number] 1950/08/13 71 y.o. 09/01/2021 2:50 PM Ethan [name], MDJones, Ethan Right, MD   Principle Diagnosis: 71 year old man with castration-sensitive advanced prostate cancer with disease to the bone and lymphadenopathy diagnosed in 2019.  He presented with a PSA of 542 and a Gleason score of 9.    Prior Therapy:   He is status post orchiectomy completed by Dr. Jeffie [name] on September 08, 2017.  Current therapy:  Zytiga 1000 mg daily with prednisone started in July 2019.  Xgeva on 120 mg every 8 weeks.    Interim History: Ethan [name] returns today for repeat follow-up.  Since last visit, he had developed lower back pain and required an epidural injection for spinal stenosis.  He continues to have shooting pain on the left side of his back as well as pelvis.  He denies any complications related to Zytiga.  He denies any falls or syncope.  Medications: Updated on review. Current Outpatient Medications  Medication Sig Dispense Refill   abiraterone acetate (ZYTIGA) 250 MG tablet Take 4 tablets (1,000 mg total) by mouth daily. Take on an empty stomach 1 hour before or 2 hours after a meal 120 tablet 0   Calcium Carb-Cholecalciferol (CALTRATE 600+D3 PO) Take 2 tablets by mouth daily.     denosumab (XGEVA) 120 MG/1.7ML SOLN injection 1.7 ml     hydrochlorothiazide (MICROZIDE) 12.5 MG capsule TAKE 1 CAPSULE BY MOUTH EVERY DAY 90 capsule 1   KLOR-CON M20 20 MEQ tablet TAKE 1 TABLET BY MOUTH EVERY DAY 90 tablet 1   Multiple Vitamin (MULTIVITAMIN) tablet Take 1 tablet by mouth daily.     predniSONE (DELTASONE) 5 MG tablet TAKE 1 TABLET BY MOUTH EVERY DAY WITH BREAKFAST 90 tablet 3   pregabalin (LYRICA) 75 MG capsule Take 1 capsule (75 mg total) by mouth 2 (two) times daily as needed. 60 capsule 3   tamsulosin (FLOMAX) 0.4 MG CAPS capsule TAKE 1 CAPSULE BY MOUTH EVERY DAY AFTER SUPPER 90 capsule 1   valsartan (DIOVAN) 80 MG tablet TAKE 1 TABLET BY  MOUTH EVERY DAY 30 tablet 1   zolpidem (AMBIEN) 10 MG tablet Take 0.5 tablets (5 mg total) by mouth at bedtime as needed for sleep. 30 tablet 1   Current Facility-Administered Medications  Medication Dose Route Frequency Provider Last Rate Last Admin   betamethasone acetate-betamethasone sodium phosphate (CELESTONE) injection 6 mg  6 mg Other Once Evelina Bucy, DPM         Allergies: No Known Allergies      Physical Exam:      Blood pressure 113/72, pulse 84, temperature 98.2 F (36.8 C), temperature source Temporal, resp. rate 17, height '5\' 10"'$  (1.778 m), weight 174 lb 12.8 oz (79.3 kg), SpO2 99 %.      ECOG: 0   General appearance: Alert, awake without any distress. Head: Atraumatic without abnormalities Oropharynx: Without any thrush or ulcers. Eyes: No scleral icterus. Lymph nodes: No lymphadenopathy noted in the cervical, supraclavicular, or axillary nodes Heart:regular rate and rhythm, without any murmurs or gallops.   Lung: Clear to auscultation without any rhonchi, wheezes or dullness to percussion. Abdomin: Soft, nontender without any shifting dullness or ascites. Musculoskeletal: No clubbing or cyanosis. Neurological: No motor or sensory deficits. Skin: No rashes or lesions.             .             Lab Results: Lab Results  Component  Value Date   WBC 8.1 08/12/2021   HGB 13.7 08/12/2021   HCT 39.2 08/12/2021   MCV 89.9 08/12/2021   PLT 272 08/12/2021   PSA 9.00 08/25/2020     Chemistry      Component Value Date/Time   NA 138 08/12/2021 1246   K 4.2 08/12/2021 1246   CL 105 08/12/2021 1246   CO2 28 08/12/2021 1246   BUN 22 08/12/2021 1246   BUN 22 (A) 08/25/2020 0000   CREATININE 0.77 08/12/2021 1246      Component Value Date/Time   CALCIUM 10.0 08/12/2021 1246   ALKPHOS 47 08/12/2021 1246   AST 20 08/12/2021 1246   ALT 15 08/12/2021 1246   BILITOT 0.8 08/12/2021 1246         Latest Reference Range &  Units 03/03/21 08:20 05/05/21 07:49 06/30/21 07:48 08/12/21 12:46  Prostate Specific Ag, Serum 0.0 - 4.0 ng/mL 6.2 (H) 5.8 (H) 6.1 (H) 5.1 (H)  (H): Data is abnormally high     Impression and Plan:   71 year old man with:   1.  Castration-sensitive advanced prostate cancer with disease to the bone and lymphadenopathy diagnosed in 2019.     His disease status was updated at this time and treatment choices were reviewed.  His PSA continues to show reasonable stability and slight decline since the last visit.  Risks and benefits of continuing Zytiga versus switching to a different treatment were discussed.  Taxotere chemotherapy will remain his best option if he developed relapsed disease.  [id number] analysis did not show any actionable mutation or candidacy for PARP and admission.  I have recommended continuing Zytiga for the time being.  2.  Androgen deprivation: No additional androgen deprivation is needed.   3.  Hypertension: Blood pressure remains within normal range and will continue to monitor on Zytiga.    4.    Bone directed therapy: He received Xgeva today and will be repeated in 2 months.  He has completed his dental surgery without any issues.  5.  Prognosis and goals of care: His disease is incurable although aggressive measures are warranted given his excellent performance status.  6.  Hypokalemia: His potassium continues to be within normal range at this time.   7.  Follow-up: In 2 months for repeat follow-up.   30 minutes were dedicated to this visit.  The time was spent on reviewing laboratory data, disease status update treatment choices for the future.  Ethan Button, MD 6/20/20232:50 PM

## 2021-09-07 ENCOUNTER — Other Ambulatory Visit: Payer: Self-pay | Admitting: Oncology

## 2021-09-07 DIAGNOSIS — C61 Malignant neoplasm of prostate: Secondary | ICD-10-CM

## 2021-09-09 ENCOUNTER — Other Ambulatory Visit (HOSPITAL_COMMUNITY): Payer: Self-pay

## 2021-09-09 ENCOUNTER — Encounter: Payer: Self-pay | Admitting: Oncology

## 2021-09-09 ENCOUNTER — Telehealth: Payer: Self-pay | Admitting: Pharmacy Technician

## 2021-09-09 NOTE — Telephone Encounter (Signed)
Oral Oncology Patient Advocate Encounter   Was successful in securing patient a $3,500 grant from [employer] (PAF) to provide copayment coverage for Abiraterone (Zytiga).  This will keep the out of pocket expense at $0.     I have spoken with the patient.    The billing information is as follows and has been shared with [facility]  RxBin: [id number] PCN:  [id number] Member ID: [id number] Group ID: [id number] Dates of Eligibility: 03/13/2021 through 09/09/2022   Lady Deutscher, [place] Patient Advocate Specialist [place] Patient Advocate Team Direct Number: [phone]  Fax: [phone]

## 2021-09-21 ENCOUNTER — Encounter: Payer: Self-pay | Admitting: Family Medicine

## 2021-09-21 DIAGNOSIS — M79605 Pain in left leg: Secondary | ICD-10-CM

## 2021-09-21 DIAGNOSIS — M5416 Radiculopathy, lumbar region: Secondary | ICD-10-CM

## 2021-09-22 ENCOUNTER — Other Ambulatory Visit (HOSPITAL_COMMUNITY): Payer: Self-pay

## 2021-09-22 ENCOUNTER — Other Ambulatory Visit: Payer: Self-pay | Admitting: Oncology

## 2021-09-22 MED ORDER — ABIRATERONE ACETATE 250 MG PO TABS
1000.0000 mg | ORAL_TABLET | Freq: Every day | ORAL | 0 refills | Status: DC
  Filled 2021-10-01: qty 120, 30d supply, fill #0

## 2021-09-25 ENCOUNTER — Ambulatory Visit
Admission: RE | Admit: 2021-09-25 | Discharge: 2021-09-25 | Disposition: A | Discharge status: Home / Self Care | Payer: Medicare Other | Source: Ambulatory Visit | Attending: Family Medicine | Admitting: Family Medicine

## 2021-09-25 DIAGNOSIS — M5416 Radiculopathy, lumbar region: Secondary | ICD-10-CM

## 2021-09-25 DIAGNOSIS — M79605 Pain in left leg: Secondary | ICD-10-CM

## 2021-09-25 DIAGNOSIS — M48061 Spinal stenosis, lumbar region without neurogenic claudication: Secondary | ICD-10-CM | POA: Diagnosis not present

## 2021-09-25 MED ORDER — METHYLPREDNISOLONE ACETATE 40 MG/ML INJ SUSP (RADIOLOG
80.0000 mg | Freq: Once | INTRAMUSCULAR | Status: AC
  Administered 2021-09-25: 80 mg via EPIDURAL

## 2021-09-25 MED ORDER — IOPAMIDOL (ISOVUE-M 200) INJECTION 41%
1.0000 mL | Freq: Once | INTRAMUSCULAR | Status: AC
  Administered 2021-09-25: 1 mL via EPIDURAL

## 2021-09-25 NOTE — Discharge Instructions (Signed)

## 2021-09-29 ENCOUNTER — Other Ambulatory Visit: Payer: Self-pay | Admitting: Oncology

## 2021-09-29 DIAGNOSIS — C61 Malignant neoplasm of prostate: Secondary | ICD-10-CM

## 2021-10-01 ENCOUNTER — Other Ambulatory Visit (HOSPITAL_COMMUNITY): Payer: Self-pay

## 2021-10-06 DIAGNOSIS — H2513 Age-related nuclear cataract, bilateral: Secondary | ICD-10-CM | POA: Diagnosis not present

## 2021-10-23 ENCOUNTER — Inpatient Hospital Stay: Payer: Medicare Other | Attending: Oncology

## 2021-10-23 ENCOUNTER — Other Ambulatory Visit: Payer: Self-pay

## 2021-10-23 DIAGNOSIS — C7951 Secondary malignant neoplasm of bone: Secondary | ICD-10-CM | POA: Insufficient documentation

## 2021-10-23 DIAGNOSIS — C61 Malignant neoplasm of prostate: Secondary | ICD-10-CM | POA: Diagnosis not present

## 2021-10-23 DIAGNOSIS — E876 Hypokalemia: Secondary | ICD-10-CM | POA: Diagnosis not present

## 2021-10-23 DIAGNOSIS — R591 Generalized enlarged lymph nodes: Secondary | ICD-10-CM | POA: Diagnosis not present

## 2021-10-23 DIAGNOSIS — Z9079 Acquired absence of other genital organ(s): Secondary | ICD-10-CM | POA: Insufficient documentation

## 2021-10-23 DIAGNOSIS — Z79899 Other long term (current) drug therapy: Secondary | ICD-10-CM | POA: Diagnosis not present

## 2021-10-23 LAB — CMP (CANCER CENTER ONLY)
ALT: 15 U/L (ref 0–44)
AST: 16 U/L (ref 15–41)
Albumin: 4.4 g/dL (ref 3.5–5.0)
Alkaline Phosphatase: 46 U/L (ref 38–126)
Anion gap: 6 (ref 5–15)
BUN: 24 mg/dL — ABNORMAL HIGH (ref 8–23)
CO2: 27 mmol/L (ref 22–32)
Calcium: 9 mg/dL (ref 8.9–10.3)
Chloride: 105 mmol/L (ref 98–111)
Creatinine: 0.88 mg/dL (ref 0.61–1.24)
GFR, Estimated: >60 mL/min (ref >60)
Glucose, Bld: 102 mg/dL — ABNORMAL HIGH (ref 70–99)
Potassium: 3.7 mmol/L (ref 3.5–5.1)
Sodium: 138 mmol/L (ref 135–145)
Total Bilirubin: 0.8 mg/dL (ref 0.3–1.2)
Total Protein: 6.6 g/dL (ref 6.5–8.1)

## 2021-10-23 LAB — CBC WITH DIFFERENTIAL (CANCER CENTER ONLY)
Abs Immature Granulocytes: 0.01 10*3/uL (ref 0.00–0.07)
Basophils Absolute: 0 10*3/uL (ref 0.0–0.1)
Basophils Relative: 1 %
Eosinophils Absolute: 0.3 10*3/uL (ref 0.0–0.5)
Eosinophils Relative: 5 %
HCT: 37.1 % — ABNORMAL LOW (ref 39.0–52.0)
Hemoglobin: 13 g/dL (ref 13.0–17.0)
Immature Granulocytes: 0 %
Lymphocytes Relative: 33 %
Lymphs Abs: 2 10*3/uL (ref 0.7–4.0)
MCH: 31.4 pg (ref 26.0–34.0)
MCHC: 35 g/dL (ref 30.0–36.0)
MCV: 89.6 fL (ref 80.0–100.0)
Monocytes Absolute: 0.6 10*3/uL (ref 0.1–1.0)
Monocytes Relative: 9 %
Neutro Abs: 3.3 10*3/uL (ref 1.7–7.7)
Neutrophils Relative %: 52 %
Platelet Count: 239 10*3/uL (ref 150–400)
RBC: 4.14 MIL/uL — ABNORMAL LOW (ref 4.22–5.81)
RDW: 12.1 % (ref 11.5–15.5)
WBC Count: 6.2 10*3/uL (ref 4.0–10.5)
nRBC: 0 % (ref 0.0–0.2)

## 2021-10-24 LAB — PROSTATE-SPECIFIC AG, SERUM (LABCORP): Prostate Specific Ag, Serum: 5 ng/mL — ABNORMAL HIGH (ref 0.0–4.0)

## 2021-10-26 ENCOUNTER — Other Ambulatory Visit (HOSPITAL_COMMUNITY): Payer: Self-pay

## 2021-10-26 ENCOUNTER — Other Ambulatory Visit: Payer: Self-pay | Admitting: Oncology

## 2021-10-26 MED ORDER — ABIRATERONE ACETATE 250 MG PO TABS
1000.0000 mg | ORAL_TABLET | Freq: Every day | ORAL | 0 refills | Status: DC
  Filled 2021-10-26: qty 120, 30d supply, fill #0

## 2021-10-28 ENCOUNTER — Other Ambulatory Visit: Payer: Self-pay | Admitting: Oncology

## 2021-10-28 DIAGNOSIS — C61 Malignant neoplasm of prostate: Secondary | ICD-10-CM

## 2021-10-29 ENCOUNTER — Other Ambulatory Visit (HOSPITAL_COMMUNITY): Payer: Self-pay

## 2021-10-29 ENCOUNTER — Telehealth: Payer: Self-pay | Admitting: Pharmacy Technician

## 2021-10-29 NOTE — Telephone Encounter (Signed)
Oral Oncology Patient Advocate Encounter  Was successful in securing patient a $8,000 grant from [facility] to provide copayment coverage for Abiraterone.  This will keep the out of pocket expense at $0.     [employer] ID: [id number]  I have spoken with the patient.   The billing information is as follows and has been shared with [facility].    RxBin: [id number] PCN: [id number] Member ID: [id number] Group ID: [id number] Dates of Eligibility: 09/29/2021 through 09/29/2022  Fund:  [place], [place] Patient Advocate Specialist [place] Patient Advocate Team Direct Number: [phone]  Fax: [phone]

## 2021-10-30 ENCOUNTER — Other Ambulatory Visit: Payer: Self-pay

## 2021-10-30 ENCOUNTER — Inpatient Hospital Stay: Payer: Medicare Other

## 2021-10-30 ENCOUNTER — Inpatient Hospital Stay (HOSPITAL_BASED_OUTPATIENT_CLINIC_OR_DEPARTMENT_OTHER): Payer: Medicare Other | Admitting: Oncology

## 2021-10-30 VITALS — BP 120/70 | HR 70 | Temp 97.9°F | Resp 15 | Wt 176.8 lb

## 2021-10-30 DIAGNOSIS — C61 Malignant neoplasm of prostate: Secondary | ICD-10-CM

## 2021-10-30 DIAGNOSIS — C7951 Secondary malignant neoplasm of bone: Secondary | ICD-10-CM | POA: Diagnosis not present

## 2021-10-30 DIAGNOSIS — E876 Hypokalemia: Secondary | ICD-10-CM | POA: Diagnosis not present

## 2021-10-30 DIAGNOSIS — Z9079 Acquired absence of other genital organ(s): Secondary | ICD-10-CM | POA: Diagnosis not present

## 2021-10-30 DIAGNOSIS — R591 Generalized enlarged lymph nodes: Secondary | ICD-10-CM | POA: Diagnosis not present

## 2021-10-30 MED ORDER — DENOSUMAB 120 MG/1.7ML ~~LOC~~ SOLN
120.0000 mg | Freq: Once | SUBCUTANEOUS | Status: AC
  Administered 2021-10-30: 120 mg via SUBCUTANEOUS
  Filled 2021-10-30: qty 1.7

## 2021-10-30 NOTE — Progress Notes (Signed)
Ok to give xgeva with corrected calcium less than 9 today per Dr. Alen Blew.

## 2021-10-30 NOTE — Patient Instructions (Signed)
Denosumab Injection (Oncology) What is this medication? DENOSUMAB (den oh SUE mab) prevents weakened bones caused by cancer. It may also be used to treat noncancerous bone tumors that cannot be removed by surgery. It can also be used to treat high calcium levels in the blood caused by cancer. It works by blocking a protein that causes bones to break down quickly. This slows down the release of calcium from bones, which lowers calcium levels in your blood. It also makes your bones stronger and less likely to break (fracture). This medicine may be used for other purposes; ask your health care provider or pharmacist if you have questions. COMMON BRAND NAME(S): XGEVA What should I tell my care team before I take this medication? They need to know if you have any of these conditions: Dental disease Having surgery or tooth extraction Infection Kidney disease Low levels of calcium or vitamin D in the blood Malnutrition On hemodialysis Skin conditions or sensitivity Thyroid or parathyroid disease An unusual reaction to denosumab, other medications, foods, dyes, or preservatives Pregnant or trying to get pregnant Breast-feeding How should I use this medication? This medication is for injection under the skin. It is given by your care team in a hospital or clinic setting. A special MedGuide will be given to you before each treatment. Be sure to read this information carefully each time. Talk to your care team about the use of this medication in children. While it may be prescribed for children as young as 13 years for selected conditions, precautions do apply. Overdosage: If you think you have taken too much of this medicine contact a poison control center or emergency room at once. NOTE: This medicine is only for you. Do not share this medicine with others. What if I miss a dose? Keep appointments for follow-up doses. It is important not to miss your dose. Call your care team if you are unable to  keep an appointment. What may interact with this medication? Do not take this medication with any of the following: Other medications containing denosumab This medication may also interact with the following: Medications that lower your chance of fighting infection Steroid medications, such as prednisone or cortisone This list may not describe all possible interactions. Give your health care provider a list of all the medicines, herbs, non-prescription drugs, or dietary supplements you use. Also tell them if you smoke, drink alcohol, or use illegal drugs. Some items may interact with your medicine. What should I watch for while using this medication? Your condition will be monitored carefully while you are receiving this medication. You may need blood work while taking this medication. This medication may increase your risk of getting an infection. Call your care team for advice if you get a fever, chills, sore throat, or other symptoms of a cold or flu. Do not treat yourself. Try to avoid being around people who are sick. You should make sure you get enough calcium and vitamin D while you are taking this medication, unless your care team tells you not to. Discuss the foods you eat and the vitamins you take with your care team. Some people who take this medication have severe bone, joint, or muscle pain. This medication may also increase your risk for jaw problems or a broken thigh bone. Tell your care team right away if you have severe pain in your jaw, bones, joints, or muscles. Tell your care team if you have any pain that does not go away or that gets worse. Talk   to your care team if you may be pregnant. Serious birth defects can occur if you take this medication during pregnancy and for 5 months after the last dose. You will need a negative pregnancy test before starting this medication. Contraception is recommended while taking this medication and for 5 months after the last dose. Your care team  can help you find the option that works for you. What side effects may I notice from receiving this medication? Side effects that you should report to your care team as soon as possible: Allergic reactions--skin rash, itching, hives, swelling of the face, lips, tongue, or throat Bone, joint, or muscle pain Low calcium level--muscle pain or cramps, confusion, tingling, or numbness in the hands or feet Osteonecrosis of the jaw--pain, swelling, or redness in the mouth, numbness of the jaw, poor healing after dental work, unusual discharge from the mouth, visible bones in the mouth Side effects that usually do not require medical attention (report to your care team if they continue or are bothersome): Cough Diarrhea Fatigue Headache Nausea This list may not describe all possible side effects. Call your doctor for medical advice about side effects. You may report side effects to FDA at 1-800-FDA-1088. Where should I keep my medication? This medication is given in a hospital or clinic. It will not be stored at home. NOTE: This sheet is a summary. It may not cover all possible information. If you have questions about this medicine, talk to your doctor, pharmacist, or health care provider.  2023 Elsevier/Gold Standard (2021-07-20 00:00:00)  

## 2021-10-30 NOTE — Progress Notes (Signed)
Hematology and Oncology Follow Up Visit  Ethan [name] [id number] 1950/05/03 71 y.o. 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    Prior Therapy:   He is status post orchiectomy completed by Dr. Jeffie Pollock on September 08, 2017.  Current therapy:  Zytiga 1000 mg daily with prednisone started in July 2019.  Xgeva on 120 mg every 8 weeks.    Interim History: Ethan [name] returns today for a follow-up.  Since last visit, he reports no major changes in his health.  He continues to be reasonably active without any decline in his performance status and quality of life.  He denies any bone pain pathological fracture.  He denies any hospitalizations or illnesses.  Medications: Reviewed without changes. Current Outpatient Medications  Medication Sig Dispense Refill   abiraterone acetate (ZYTIGA) 250 MG tablet Take 4 tablets (1,000 mg total) by mouth daily. Take on an empty stomach 1 hour before or 2 hours after a meal 120 tablet 0   Calcium Carb-Cholecalciferol (CALTRATE 600+D3 PO) Take 2 tablets by mouth daily.     denosumab (XGEVA) 120 MG/1.7ML SOLN injection 1.7 ml     hydrochlorothiazide (MICROZIDE) 12.5 MG capsule TAKE 1 CAPSULE BY MOUTH EVERY DAY 90 capsule 1   KLOR-CON M20 20 MEQ tablet TAKE 1 TABLET BY MOUTH EVERY DAY 90 tablet 1   Multiple Vitamin (MULTIVITAMIN) tablet Take 1 tablet by mouth daily.     predniSONE (DELTASONE) 5 MG tablet TAKE 1 TABLET BY MOUTH EVERY DAY WITH BREAKFAST 90 tablet 3   pregabalin (LYRICA) 75 MG capsule Take 1 capsule (75 mg total) by mouth 2 (two) times daily as needed. 60 capsule 3   tamsulosin (FLOMAX) 0.4 MG CAPS capsule TAKE 1 CAPSULE BY MOUTH EVERY DAY AFTER SUPPER 90 capsule 1   valsartan (DIOVAN) 80 MG tablet TAKE 1 TABLET BY  MOUTH EVERY DAY 30 tablet 1   zolpidem (AMBIEN) 10 MG tablet Take 0.5 tablets (5 mg total) by mouth at bedtime as needed for sleep. 30 tablet 1   Current Facility-Administered Medications  Medication Dose Route Frequency Provider Last Rate Last Admin   betamethasone acetate-betamethasone sodium phosphate (CELESTONE) injection 6 mg  6 mg Other Once Evelina Bucy, DPM         Allergies: No Known Allergies      Physical Exam:       Blood pressure 120/70, pulse 70, temperature 97.9 F (36.6 C), temperature source Temporal, resp. rate 15, weight 176 lb 12.8 oz (80.2 kg), SpO2 99 %.      ECOG: 0    General appearance: Comfortable appearing without any discomfort Head: Normocephalic without any trauma Oropharynx: Mucous membranes are moist and pink without any thrush or ulcers. Eyes: Pupils are equal and round reactive to light. Lymph nodes: No cervical, supraclavicular, inguinal or axillary lymphadenopathy.   Heart:regular rate and rhythm.  S1 and S2 without leg edema. Lung: Clear without any rhonchi or wheezes.  No dullness to percussion. Abdomin: Soft, nontender, nondistended with good bowel sounds.  No hepatosplenomegaly. Musculoskeletal: No joint deformity or effusion.  Full range of motion noted. Neurological: No deficits noted on motor, sensory and deep tendon reflex exam. Skin: No petechial rash or dryness.  Appeared moist.  .             Marland Kitchen  Lab Results: Lab Results  Component Value Date   WBC 6.2 10/23/2021   HGB 13.0 10/23/2021   HCT 37.1 (L) 10/23/2021   MCV 89.6 10/23/2021   PLT 239 10/23/2021   PSA 9.00 08/25/2020     Chemistry      Component Value Date/Time   NA 138 10/23/2021 0725   K 3.7 10/23/2021 0725   CL 105 10/23/2021 0725   CO2 27 10/23/2021 0725   BUN 24 (H) 10/23/2021 0725   BUN 22 (A) 08/25/2020 0000   CREATININE 0.88 10/23/2021 0725      Component Value Date/Time   CALCIUM 9.0 10/23/2021 0725    ALKPHOS 46 10/23/2021 0725   AST 16 10/23/2021 0725   ALT 15 10/23/2021 0725   BILITOT 0.8 10/23/2021 0725       Latest Reference Range & Units 05/05/21 07:49 06/30/21 07:48 08/12/21 12:46 10/23/21 07:25  Prostate Specific Ag, Serum 0.0 - 4.0 ng/mL 5.8 (H) 6.1 (H) 5.1 (H) 5.0 (H)  (H): Data is abnormally high      Impression and Plan:   71 year old man with:   1.  Advanced prostate cancer with lymphadenopathy and bone disease diagnosed in 2019.  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  His PSA obtained on October 23, 2021 continues to show overall stable level at 5.0.  Risks and benefits of continuing Zytiga were discussed.  Alternative treatment options including Taxotere chemotherapy as well as Pluvicto were discussed.  I recommended continuing Zytiga for the time being we will update his staging scan next 4 months.  He is agreeable with this plan.  2.  Androgen deprivation: He is status post orchiectomy without any additional androgen deprivation.   3.  Hypertension: His blood pressure is within normal range.    4.    Bone directed therapy: Risks and benefits of continuing Xgeva were discussed at this time.  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  He is agreeable to proceed.   5.  Prognosis and goals of care: Therapy remains palliative though aggressive measures are warranted.  Disease under excellent control  6.  Hypokalemia: Related to Zytiga with potassium 3.7 without any need for replacement.   7.  Follow-up: 2 months for a follow-up.   30 minutes were spent on this encounter.  The time was dedicated to reviewing laboratory data, disease status update, discussing treatment choices and future plan of care discussion.  Zola Button, MD 8/18/20232:55 PM

## 2021-11-02 ENCOUNTER — Other Ambulatory Visit (HOSPITAL_COMMUNITY): Payer: Self-pay

## 2021-11-03 ENCOUNTER — Other Ambulatory Visit: Payer: Medicare Other

## 2021-11-09 ENCOUNTER — Telehealth: Payer: Self-pay | Admitting: Oncology

## 2021-11-09 NOTE — Telephone Encounter (Signed)
Called patient regarding upcoming October appointment, patient is notified.   

## 2021-11-10 ENCOUNTER — Ambulatory Visit: Payer: Medicare Other

## 2021-11-10 ENCOUNTER — Other Ambulatory Visit: Payer: Self-pay | Admitting: Oncology

## 2021-11-10 ENCOUNTER — Ambulatory Visit: Payer: Medicare Other | Admitting: Oncology

## 2021-11-19 ENCOUNTER — Other Ambulatory Visit (HOSPITAL_COMMUNITY): Payer: Self-pay

## 2021-11-19 ENCOUNTER — Other Ambulatory Visit: Payer: Self-pay | Admitting: Oncology

## 2021-11-19 DIAGNOSIS — C61 Malignant neoplasm of prostate: Secondary | ICD-10-CM

## 2021-11-19 MED ORDER — ABIRATERONE ACETATE 250 MG PO TABS
1000.0000 mg | ORAL_TABLET | Freq: Every day | ORAL | 0 refills | Status: DC
  Filled 2021-11-19: qty 120, 30d supply, fill #0

## 2021-12-01 ENCOUNTER — Other Ambulatory Visit (HOSPITAL_COMMUNITY): Payer: Self-pay

## 2021-12-01 ENCOUNTER — Encounter: Payer: Self-pay | Admitting: Oncology

## 2021-12-18 DIAGNOSIS — B078 Other viral warts: Secondary | ICD-10-CM | POA: Diagnosis not present

## 2021-12-18 DIAGNOSIS — D225 Melanocytic nevi of trunk: Secondary | ICD-10-CM | POA: Diagnosis not present

## 2021-12-18 DIAGNOSIS — X32XXXD Exposure to sunlight, subsequent encounter: Secondary | ICD-10-CM | POA: Diagnosis not present

## 2021-12-18 DIAGNOSIS — L570 Actinic keratosis: Secondary | ICD-10-CM | POA: Diagnosis not present

## 2021-12-23 ENCOUNTER — Other Ambulatory Visit (HOSPITAL_COMMUNITY): Payer: Self-pay

## 2021-12-23 ENCOUNTER — Other Ambulatory Visit: Payer: Self-pay | Admitting: Oncology

## 2021-12-23 MED ORDER — ABIRATERONE ACETATE 250 MG PO TABS
1000.0000 mg | ORAL_TABLET | Freq: Every day | ORAL | 0 refills | Status: DC
  Filled 2021-12-23: qty 120, 30d supply, fill #0

## 2021-12-24 ENCOUNTER — Other Ambulatory Visit: Payer: Self-pay | Admitting: Oncology

## 2021-12-24 DIAGNOSIS — C61 Malignant neoplasm of prostate: Secondary | ICD-10-CM

## 2021-12-25 ENCOUNTER — Other Ambulatory Visit (HOSPITAL_COMMUNITY): Payer: Self-pay

## 2021-12-30 ENCOUNTER — Inpatient Hospital Stay: Payer: Medicare Other | Attending: Oncology

## 2021-12-30 ENCOUNTER — Other Ambulatory Visit (HOSPITAL_COMMUNITY): Payer: Self-pay

## 2021-12-30 DIAGNOSIS — I10 Essential (primary) hypertension: Secondary | ICD-10-CM | POA: Diagnosis not present

## 2021-12-30 DIAGNOSIS — Z9079 Acquired absence of other genital organ(s): Secondary | ICD-10-CM | POA: Diagnosis not present

## 2021-12-30 DIAGNOSIS — E876 Hypokalemia: Secondary | ICD-10-CM | POA: Insufficient documentation

## 2021-12-30 DIAGNOSIS — Z79899 Other long term (current) drug therapy: Secondary | ICD-10-CM | POA: Diagnosis not present

## 2021-12-30 DIAGNOSIS — C61 Malignant neoplasm of prostate: Secondary | ICD-10-CM | POA: Insufficient documentation

## 2021-12-30 DIAGNOSIS — C7951 Secondary malignant neoplasm of bone: Secondary | ICD-10-CM | POA: Insufficient documentation

## 2021-12-30 DIAGNOSIS — Z7952 Long term (current) use of systemic steroids: Secondary | ICD-10-CM | POA: Insufficient documentation

## 2021-12-30 DIAGNOSIS — C775 Secondary and unspecified malignant neoplasm of intrapelvic lymph nodes: Secondary | ICD-10-CM | POA: Diagnosis not present

## 2021-12-30 LAB — CMP (CANCER CENTER ONLY)
ALT: 14 U/L (ref 0–44)
AST: 19 U/L (ref 15–41)
Albumin: 4.5 g/dL (ref 3.5–5.0)
Alkaline Phosphatase: 56 U/L (ref 38–126)
Anion gap: 6 (ref 5–15)
BUN: 16 mg/dL (ref 8–23)
CO2: 27 mmol/L (ref 22–32)
Calcium: 9.5 mg/dL (ref 8.9–10.3)
Chloride: 104 mmol/L (ref 98–111)
Creatinine: 0.91 mg/dL (ref 0.61–1.24)
GFR, Estimated: >60 mL/min (ref >60)
Glucose, Bld: 111 mg/dL — ABNORMAL HIGH (ref 70–99)
Potassium: 4.3 mmol/L (ref 3.5–5.1)
Sodium: 137 mmol/L (ref 135–145)
Total Bilirubin: 1 mg/dL (ref 0.3–1.2)
Total Protein: 6.9 g/dL (ref 6.5–8.1)

## 2021-12-30 LAB — CBC WITH DIFFERENTIAL (CANCER CENTER ONLY)
Abs Immature Granulocytes: 0.03 10*3/uL (ref 0.00–0.07)
Basophils Absolute: 0 10*3/uL (ref 0.0–0.1)
Basophils Relative: 0 %
Eosinophils Absolute: 1.9 10*3/uL — ABNORMAL HIGH (ref 0.0–0.5)
Eosinophils Relative: 22 %
HCT: 42.7 % (ref 39.0–52.0)
Hemoglobin: 14.4 g/dL (ref 13.0–17.0)
Immature Granulocytes: 0 %
Lymphocytes Relative: 26 %
Lymphs Abs: 2.4 10*3/uL (ref 0.7–4.0)
MCH: 31.4 pg (ref 26.0–34.0)
MCHC: 33.7 g/dL (ref 30.0–36.0)
MCV: 93.2 fL (ref 80.0–100.0)
Monocytes Absolute: 0.7 10*3/uL (ref 0.1–1.0)
Monocytes Relative: 8 %
Neutro Abs: 3.9 10*3/uL (ref 1.7–7.7)
Neutrophils Relative %: 44 %
Platelet Count: 305 10*3/uL (ref 150–400)
RBC: 4.58 MIL/uL (ref 4.22–5.81)
RDW: 12.2 % (ref 11.5–15.5)
WBC Count: 8.9 10*3/uL (ref 4.0–10.5)
nRBC: 0 % (ref 0.0–0.2)

## 2021-12-31 LAB — PROSTATE-SPECIFIC AG, SERUM (LABCORP): Prostate Specific Ag, Serum: 6.2 ng/mL — ABNORMAL HIGH (ref 0.0–4.0)

## 2022-01-01 ENCOUNTER — Inpatient Hospital Stay (HOSPITAL_BASED_OUTPATIENT_CLINIC_OR_DEPARTMENT_OTHER): Payer: Medicare Other | Admitting: Oncology

## 2022-01-01 VITALS — BP 115/74 | HR 81 | Temp 98.0°F | Resp 17 | Ht 70.0 in | Wt 176.3 lb

## 2022-01-01 DIAGNOSIS — C61 Malignant neoplasm of prostate: Secondary | ICD-10-CM | POA: Diagnosis not present

## 2022-01-01 DIAGNOSIS — I10 Essential (primary) hypertension: Secondary | ICD-10-CM | POA: Diagnosis not present

## 2022-01-01 DIAGNOSIS — C7951 Secondary malignant neoplasm of bone: Secondary | ICD-10-CM | POA: Diagnosis not present

## 2022-01-01 DIAGNOSIS — Z9079 Acquired absence of other genital organ(s): Secondary | ICD-10-CM | POA: Diagnosis not present

## 2022-01-01 DIAGNOSIS — E876 Hypokalemia: Secondary | ICD-10-CM | POA: Diagnosis not present

## 2022-01-01 DIAGNOSIS — C775 Secondary and unspecified malignant neoplasm of intrapelvic lymph nodes: Secondary | ICD-10-CM | POA: Diagnosis not present

## 2022-01-01 NOTE — Progress Notes (Signed)
Hematology and Oncology Follow Up Visit  Ethan [name] [id number] [birth year] 71 y.o. 01/01/2022 12:57 PM Ethan [name], MDJones, Ethan Right, MD   Principle Diagnosis: 71 year old man with castration-sensitive advanced prostate cancer with disease to the bone and lymphadenopathy diagnosed in 2019.  He presented with a PSA of 542 and a Gleason score of 9.    Prior Therapy:   He is status post orchiectomy completed by Dr. Jeffie [name] on September 08, 2017.  Current therapy:  Zytiga 1000 mg daily with prednisone started in July 2019.  Xgeva on 120 mg every 8 weeks.    Interim History: Ethan [name] is here for a repeat evaluation.  Since last visit, he reports no major changes in his health.  He denies any nausea, vomiting or abdominal pain.  He denies any hospitalizations or illnesses.  He denies any bone pain or pathological fractures.  His performance status and quality of life remains unchanged.  Medications: Reviewed without changes. Current Outpatient Medications  Medication Sig Dispense Refill   abiraterone acetate (ZYTIGA) 250 MG tablet Take 4 tablets (1,000 mg total) by mouth daily. Take on an empty stomach 1 hour before or 2 hours after a meal 120 tablet 0   Calcium Carb-Cholecalciferol (CALTRATE 600+D3 PO) Take 2 tablets by mouth daily.     denosumab (XGEVA) 120 MG/1.7ML SOLN injection 1.7 ml     hydrochlorothiazide (MICROZIDE) 12.5 MG capsule TAKE 1 CAPSULE BY MOUTH EVERY DAY 90 capsule 1   KLOR-CON M20 20 MEQ tablet TAKE 1 TABLET BY MOUTH EVERY DAY 90 tablet 1   Multiple Vitamin (MULTIVITAMIN) tablet Take 1 tablet by mouth daily.     predniSONE (DELTASONE) 5 MG tablet TAKE 1 TABLET BY MOUTH EVERY DAY WITH BREAKFAST 90 tablet 3   pregabalin (LYRICA) 75 MG capsule Take 1 capsule (75 mg total) by mouth 2 (two) times daily as needed. 60 capsule 3   tamsulosin (FLOMAX) 0.4 MG CAPS capsule TAKE 1 CAPSULE BY MOUTH EVERY DAY AFTER SUPPER 90 capsule 1   valsartan (DIOVAN) 80 MG tablet TAKE 1  TABLET BY MOUTH EVERY DAY 90 tablet 1   zolpidem (AMBIEN) 10 MG tablet Take 0.5 tablets (5 mg total) by mouth at bedtime as needed for sleep. 30 tablet 1   Current Facility-Administered Medications  Medication Dose Route Frequency Provider Last Rate Last Admin   betamethasone acetate-betamethasone sodium phosphate (CELESTONE) injection 6 mg  6 mg Other Once Ethan [name], DPM         Allergies: No Known Allergies      Physical Exam:       Blood pressure 115/74, pulse 81, temperature 98 F (36.7 C), temperature source Temporal, resp. rate 17, height '5\' 10"'$  (1.778 m), weight 176 lb 4.8 oz (80 kg), SpO2 100 %.      ECOG: 0   General appearance: Alert, awake without any distress. Head: Atraumatic without abnormalities Oropharynx: Without any thrush or ulcers. Eyes: No scleral icterus. Lymph nodes: No lymphadenopathy noted in the cervical, supraclavicular, or axillary nodes Heart:regular rate and rhythm, without any murmurs or gallops.   Lung: Clear to auscultation without any rhonchi, wheezes or dullness to percussion. Abdomin: Soft, nontender without any shifting dullness or ascites. Musculoskeletal: No clubbing or cyanosis. Neurological: No motor or sensory deficits. Skin: No rashes or lesions.  .             .             Lab Results: Lab Results  Component  Value Date   WBC 8.9 12/30/2021   HGB 14.4 12/30/2021   HCT 42.7 12/30/2021   MCV 93.2 12/30/2021   PLT 305 12/30/2021   PSA 9.00 08/25/2020     Chemistry      Component Value Date/Time   NA 137 12/30/2021 0746   K 4.3 12/30/2021 0746   CL 104 12/30/2021 0746   CO2 27 12/30/2021 0746   BUN 16 12/30/2021 0746   BUN 22 (A) 08/25/2020 0000   CREATININE 0.91 12/30/2021 0746      Component Value Date/Time   CALCIUM 9.5 12/30/2021 0746   ALKPHOS 56 12/30/2021 0746   AST 19 12/30/2021 0746   ALT 14 12/30/2021 0746   BILITOT 1.0 12/30/2021 0746        Latest Reference  Range & Units 06/30/21 07:48 08/12/21 12:46 10/23/21 07:25 12/30/21 07:46  Prostate Specific Ag, Serum 0.0 - 4.0 ng/mL 6.1 (H) 5.1 (H) 5.0 (H) 6.2 (H)  (H): Data is abnormally high    Impression and Plan:   71 year old man with:   1.  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    The natural course of this disease was reviewed at this time and laboratory data were discussed.  His PSA continues to show slow rise but overall stable over the last year.  Management options were discussed at this time.  Continuing Zytiga versus switching to a different agent were discussed.  Taxotere chemotherapy versus possible Pluvicto to gets approved could be considered.  Given his overall mild increase in overall stability I recommended continued monitoring at this time.  We will update his PSMA PET scan in the next 2 months.   2.  Androgen deprivation: No additional androgen deprivation therapy is needed.  He is status post orchiectomy.   3.  Hypertension: Related to Zytiga with blood pressure is within normal range at this time.    4.    Bone directed therapy: He is currently on Xgeva which will be resumed with the next visit and every 2 months after that.   5.  Prognosis and goals of care: Aggressive measures are warranted given his excellent performance status.  6.  Hypokalemia: Related to Zytiga and overall adequate potassium stores noted.   7.  Follow-up: He will return in 2 months for a follow-up evaluation.   30 minutes were dedicated to this encounter.  The time was spent on reviewing laboratory data, disease status update and outlining future plan of care discussion.  Ethan Button, MD 10/20/202312:57 PM

## 2022-01-23 ENCOUNTER — Encounter: Payer: Self-pay | Admitting: Oncology

## 2022-01-25 ENCOUNTER — Other Ambulatory Visit (HOSPITAL_COMMUNITY): Payer: Self-pay

## 2022-01-25 ENCOUNTER — Other Ambulatory Visit: Payer: Self-pay

## 2022-01-25 ENCOUNTER — Other Ambulatory Visit: Payer: Self-pay | Admitting: Oncology

## 2022-01-25 DIAGNOSIS — C61 Malignant neoplasm of prostate: Secondary | ICD-10-CM

## 2022-01-25 MED ORDER — ABIRATERONE ACETATE 250 MG PO TABS
1000.0000 mg | ORAL_TABLET | Freq: Every day | ORAL | 0 refills | Status: DC
  Filled 2022-01-25: qty 120, 30d supply, fill #0

## 2022-01-28 ENCOUNTER — Encounter: Payer: Self-pay | Admitting: Oncology

## 2022-01-29 ENCOUNTER — Other Ambulatory Visit (HOSPITAL_COMMUNITY): Payer: Self-pay

## 2022-02-05 ENCOUNTER — Encounter: Payer: Self-pay | Admitting: Family Medicine

## 2022-02-05 DIAGNOSIS — M5416 Radiculopathy, lumbar region: Secondary | ICD-10-CM

## 2022-02-05 DIAGNOSIS — M79605 Pain in left leg: Secondary | ICD-10-CM

## 2022-02-09 ENCOUNTER — Ambulatory Visit (INDEPENDENT_AMBULATORY_CARE_PROVIDER_SITE_OTHER): Payer: Medicare Other | Admitting: Family Medicine

## 2022-02-09 VITALS — BP 110/74 | HR 86 | Ht 70.0 in | Wt 188.0 lb

## 2022-02-09 DIAGNOSIS — M5416 Radiculopathy, lumbar region: Secondary | ICD-10-CM

## 2022-02-09 DIAGNOSIS — Z23 Encounter for immunization: Secondary | ICD-10-CM | POA: Diagnosis not present

## 2022-02-09 NOTE — Progress Notes (Signed)
I, Peterson Lombard, LAT, ATC acting as a scribe for Lynne Leader, MD.  Ethan [name] is a 71 y.o. male who presents to [place] at [facility] today for re-occurrence of lumbar radiculopathy.  Pt has a hx of metastatic prostate cancer.  He was last seen by Dr. Georgina Snell on 08/26/2021 and he was switched from gabapentin to Lyrica, and an ESI was ordered, and later performed on 08/28/2021.  Patient sent a MyChart message on 09/21/2021 reporting not much relief from prior [facility] and a repeat was ordered, and later performed on 09/25/2021.  Today, patient reports 2nd ESI lasted for about 3-4 months. Pt started to have LBP again on Thanksgiving and now is having radiating pain into his L leg; anterior lower leg and posterior thigh. LBP has resolved.   Dx imaging: 08/23/2021 L hip MRI & L-spine MRI  07/14/2021 L femur & L-spine x-ray  Pertinent review of systems: No fevers or chills  Relevant historical information: Hypertension.  History of prostate cancer.   Exam:  BP 110/74   Pulse 86   Ht '5\' 10"'$  (1.778 m)   Wt 188 lb (85.3 kg)   SpO2 97%   BMI 26.98 kg/m  General: Well Developed, well nourished, and in no acute distress.   MSK: L-spine: Normal. Nontender midline. Normal lumbar motion. Lower extremity strength is intact.    Lab and Radiology Results XAM: MRI LUMBAR SPINE WITHOUT CONTRAST   TECHNIQUE: Multiplanar, multisequence MR imaging of the lumbar spine was performed. No intravenous contrast was administered.   COMPARISON:  Radiography 07/14/2021   FINDINGS: Segmentation:  5 lumbar type vertebral bodies.   Alignment:  Straightening of the normal lumbar lordosis.   Vertebrae: No evidence metastatic disease to the bone. Chronic endplate Schmorl's nodes, most prominent at superior endplate L3. Discogenic endplate changes at U9-3 without active edema.   Conus medullaris and cauda equina: Conus extends to the L1 level. Conus and cauda equina appear normal.    Paraspinal and other soft tissues: Negative   Disc levels:   T11-12, T12-L1 and L1-2: Normal   L2-3: Mild bulging of the disc.  No compressive stenosis.   L3-4: Moderate bulging of the disc. Mild facet and ligamentous hypertrophy. Mild narrowing of the lateral recesses without visible neural compression.   L4-5: Endplate osteophytes and moderate bulging of the disc. Facet and ligamentous hypertrophy. Moderate multifactorial stenosis of the canal, lateral recesses and neural foramina which could cause neural compression on either or both sides. Findings are slightly worse on the right.   L5-S1: Broad-based disc herniation more prominent in the left posterolateral direction. Facet degeneration with facet and ligamentous hypertrophy. Stenosis of the lateral recesses left worse than right. Left S1 nerve compression is likely. Foraminal stenosis on the left could also affect the exiting L5 nerve.   IMPRESSION: No evidence of regional metastatic disease.   L3-4: Disc bulge. Mild facet hypertrophy. Mild lateral recess stenosis without visible neural compression.   L4-5: Moderate multifactorial stenosis of the canal, lateral recesses and foramina that could cause neural compression on either or both sides, somewhat worse on the right.   L5-S1: Broad-based disc herniation more prominent in the left posterolateral direction. Lateral recess stenosis left worse than right. Left S1 nerve compression is likely. There is also foraminal narrowing left worse than right with some potential to affect the left L5 nerve.     Electronically Signed   By: Nelson Chimes M.D.   On: 08/25/2021 09:14 I, Lynne Leader,  personally (independently) visualized and performed the interpretation of the images attached in this note.  FLUOROSCOPY: Radiation Exposure Index (as provided by the fluoroscopic device): 0.6 minutes (6 mGy)   PROCEDURE: The procedure, risks, benefits, and alternatives were  explained to the patient. Questions regarding the procedure were encouraged and answered. The patient understands and consents to the procedure.   LUMBAR EPIDURAL INJECTION:   An interlaminar approach was performed on left at L4-L5. The overlying skin was cleansed and anesthetized. A 20 gauge epidural needle was advanced using loss-of-resistance technique.   DIAGNOSTIC EPIDURAL INJECTION:   Injection of Isovue-M 200 shows a good epidural pattern with spread above and below the level of needle placement, primarily on the left. No vascular opacification is seen.   THERAPEUTIC EPIDURAL INJECTION:   80 Mg of Depo-Medrol mixed with 2 mL 1% lidocaine were instilled. The procedure was well-tolerated, and the patient was discharged thirty minutes following the injection in good condition.   COMPLICATIONS: None immediate   IMPRESSION: Technically successful epidural steroid injection at L4-L5 via left interlaminar approach.     Electronically Signed   By: Albin Felling M.D.   On: 09/25/2021 09:55      Assessment and Plan: 71 y.o. male with acute exacerbation of back pain with left lumbosacral radiculopathy symptoms.  This was quite significant last week but now is improving.  The pain is mostly consistent with S1 and L5 radiculopathy.  He had good response last July to an interlaminar epidural steroid injection at left L4-5.  If his pain does not continue to resolve or worsens again would recommend a repeat of this injection. He will let me know.  We discussed his current pain treatment plan and options.  Discussed warning signs or symptoms. Please see discharge instructions. Patient expresses understanding.   The above documentation has been reviewed and is accurate and complete Lynne Leader, M.D.

## 2022-02-09 NOTE — Patient Instructions (Addendum)
Thank you for coming in today.   If the pain were to return I can order the back injection again.  Let me know.   I will out of town the week of Dec 18-21. Let me know before then if you need a shot.

## 2022-02-12 ENCOUNTER — Ambulatory Visit
Admission: RE | Admit: 2022-02-12 | Discharge: 2022-02-12 | Disposition: A | Discharge status: Home / Self Care | Payer: Medicare Other | Source: Ambulatory Visit | Attending: Family Medicine | Admitting: Family Medicine

## 2022-02-12 DIAGNOSIS — M5416 Radiculopathy, lumbar region: Secondary | ICD-10-CM

## 2022-02-12 DIAGNOSIS — M79605 Pain in left leg: Secondary | ICD-10-CM

## 2022-02-12 DIAGNOSIS — M4727 Other spondylosis with radiculopathy, lumbosacral region: Secondary | ICD-10-CM | POA: Diagnosis not present

## 2022-02-12 MED ORDER — METHYLPREDNISOLONE ACETATE 40 MG/ML INJ SUSP (RADIOLOG
80.0000 mg | Freq: Once | INTRAMUSCULAR | Status: AC
  Administered 2022-02-12: 80 mg via EPIDURAL

## 2022-02-12 MED ORDER — IOPAMIDOL (ISOVUE-M 200) INJECTION 41%
1.0000 mL | Freq: Once | INTRAMUSCULAR | Status: AC
  Administered 2022-02-12: 1 mL via EPIDURAL

## 2022-02-12 NOTE — Discharge Instructions (Signed)

## 2022-02-18 ENCOUNTER — Telehealth: Payer: Self-pay | Admitting: *Deleted

## 2022-02-18 NOTE — Telephone Encounter (Signed)
PC to patient, informed his PET scan has been authorized by insurance, he may schedule this by [employer] at [phone].  Patient verbalizes understanding & will schedule.

## 2022-02-22 ENCOUNTER — Encounter: Payer: Self-pay | Admitting: Family Medicine

## 2022-02-22 DIAGNOSIS — M5416 Radiculopathy, lumbar region: Secondary | ICD-10-CM

## 2022-02-22 DIAGNOSIS — M79605 Pain in left leg: Secondary | ICD-10-CM

## 2022-02-23 ENCOUNTER — Other Ambulatory Visit: Payer: Medicare Other

## 2022-02-23 ENCOUNTER — Other Ambulatory Visit: Payer: Self-pay | Admitting: Oncology

## 2022-02-23 ENCOUNTER — Other Ambulatory Visit (HOSPITAL_COMMUNITY): Payer: Self-pay

## 2022-02-23 DIAGNOSIS — C61 Malignant neoplasm of prostate: Secondary | ICD-10-CM

## 2022-02-23 MED ORDER — ABIRATERONE ACETATE 250 MG PO TABS
1000.0000 mg | ORAL_TABLET | Freq: Every day | ORAL | 0 refills | Status: DC
  Filled 2022-02-23 – 2022-02-26 (×2): qty 120, 30d supply, fill #0

## 2022-02-24 ENCOUNTER — Inpatient Hospital Stay: Payer: Medicare Other | Attending: Oncology

## 2022-02-24 ENCOUNTER — Other Ambulatory Visit (HOSPITAL_COMMUNITY): Payer: Self-pay

## 2022-02-24 DIAGNOSIS — Z9079 Acquired absence of other genital organ(s): Secondary | ICD-10-CM | POA: Diagnosis not present

## 2022-02-24 DIAGNOSIS — Z79899 Other long term (current) drug therapy: Secondary | ICD-10-CM | POA: Insufficient documentation

## 2022-02-24 DIAGNOSIS — I10 Essential (primary) hypertension: Secondary | ICD-10-CM | POA: Insufficient documentation

## 2022-02-24 DIAGNOSIS — C61 Malignant neoplasm of prostate: Secondary | ICD-10-CM | POA: Insufficient documentation

## 2022-02-24 DIAGNOSIS — E876 Hypokalemia: Secondary | ICD-10-CM | POA: Insufficient documentation

## 2022-02-24 LAB — CBC WITH DIFFERENTIAL (CANCER CENTER ONLY)
Abs Immature Granulocytes: 0.04 10*3/uL (ref 0.00–0.07)
Basophils Absolute: 0.1 10*3/uL (ref 0.0–0.1)
Basophils Relative: 1 %
Eosinophils Absolute: 0.3 10*3/uL (ref 0.0–0.5)
Eosinophils Relative: 4 %
HCT: 43.8 % (ref 39.0–52.0)
Hemoglobin: 14.7 g/dL (ref 13.0–17.0)
Immature Granulocytes: 1 %
Lymphocytes Relative: 34 %
Lymphs Abs: 2.8 10*3/uL (ref 0.7–4.0)
MCH: 31.2 pg (ref 26.0–34.0)
MCHC: 33.6 g/dL (ref 30.0–36.0)
MCV: 93 fL (ref 80.0–100.0)
Monocytes Absolute: 0.8 10*3/uL (ref 0.1–1.0)
Monocytes Relative: 9 %
Neutro Abs: 4.1 10*3/uL (ref 1.7–7.7)
Neutrophils Relative %: 51 %
Platelet Count: 304 10*3/uL (ref 150–400)
RBC: 4.71 MIL/uL (ref 4.22–5.81)
RDW: 11.9 % (ref 11.5–15.5)
WBC Count: 8.1 10*3/uL (ref 4.0–10.5)
nRBC: 0 % (ref 0.0–0.2)

## 2022-02-24 LAB — CMP (CANCER CENTER ONLY)
ALT: 15 U/L (ref 0–44)
AST: 18 U/L (ref 15–41)
Albumin: 4.6 g/dL (ref 3.5–5.0)
Alkaline Phosphatase: 52 U/L (ref 38–126)
Anion gap: 7 (ref 5–15)
BUN: 20 mg/dL (ref 8–23)
CO2: 32 mmol/L (ref 22–32)
Calcium: 10.3 mg/dL (ref 8.9–10.3)
Chloride: 101 mmol/L (ref 98–111)
Creatinine: 0.91 mg/dL (ref 0.61–1.24)
GFR, Estimated: >60 mL/min (ref >60)
Glucose, Bld: 113 mg/dL — ABNORMAL HIGH (ref 70–99)
Potassium: 4.5 mmol/L (ref 3.5–5.1)
Sodium: 140 mmol/L (ref 135–145)
Total Bilirubin: 1.1 mg/dL (ref 0.3–1.2)
Total Protein: 6.8 g/dL (ref 6.5–8.1)

## 2022-02-26 ENCOUNTER — Other Ambulatory Visit: Payer: Self-pay

## 2022-02-26 ENCOUNTER — Other Ambulatory Visit (HOSPITAL_COMMUNITY): Payer: Self-pay

## 2022-02-26 ENCOUNTER — Encounter (HOSPITAL_COMMUNITY)
Admission: RE | Admit: 2022-02-26 | Discharge: 2022-02-26 | Disposition: A | Discharge status: Home / Self Care | Payer: Medicare Other | Source: Ambulatory Visit | Attending: Oncology | Admitting: Oncology

## 2022-02-26 DIAGNOSIS — C61 Malignant neoplasm of prostate: Secondary | ICD-10-CM | POA: Diagnosis not present

## 2022-02-26 LAB — PROSTATE-SPECIFIC AG, SERUM (LABCORP): Prostate Specific Ag, Serum: 6.6 ng/mL — ABNORMAL HIGH (ref 0.0–4.0)

## 2022-02-26 MED ORDER — PIFLIFOLASTAT F 18 (PYLARIFY) INJECTION
9.0000 | Freq: Once | INTRAVENOUS | Status: AC
  Administered 2022-02-26: 8.9 via INTRAVENOUS

## 2022-03-01 ENCOUNTER — Ambulatory Visit
Admission: RE | Admit: 2022-03-01 | Discharge: 2022-03-01 | Disposition: A | Discharge status: Home / Self Care | Payer: Medicare Other | Source: Ambulatory Visit | Attending: Family Medicine | Admitting: Family Medicine

## 2022-03-01 ENCOUNTER — Other Ambulatory Visit: Payer: Self-pay

## 2022-03-01 DIAGNOSIS — M4727 Other spondylosis with radiculopathy, lumbosacral region: Secondary | ICD-10-CM | POA: Diagnosis not present

## 2022-03-01 DIAGNOSIS — M79605 Pain in left leg: Secondary | ICD-10-CM

## 2022-03-01 DIAGNOSIS — M5416 Radiculopathy, lumbar region: Secondary | ICD-10-CM

## 2022-03-01 MED ORDER — IOPAMIDOL (ISOVUE-M 200) INJECTION 41%
1.0000 mL | Freq: Once | INTRAMUSCULAR | Status: AC
  Administered 2022-03-01: 1 mL via EPIDURAL

## 2022-03-01 MED ORDER — METHYLPREDNISOLONE ACETATE 40 MG/ML INJ SUSP (RADIOLOG
80.0000 mg | Freq: Once | INTRAMUSCULAR | Status: AC
  Administered 2022-03-01: 80 mg via EPIDURAL

## 2022-03-01 NOTE — Discharge Instructions (Signed)

## 2022-03-02 ENCOUNTER — Inpatient Hospital Stay (HOSPITAL_BASED_OUTPATIENT_CLINIC_OR_DEPARTMENT_OTHER): Payer: Medicare Other | Admitting: Oncology

## 2022-03-02 ENCOUNTER — Inpatient Hospital Stay: Payer: Medicare Other

## 2022-03-02 VITALS — BP 127/72 | HR 62 | Temp 97.8°F | Resp 17 | Ht 70.0 in | Wt 173.3 lb

## 2022-03-02 DIAGNOSIS — C61 Malignant neoplasm of prostate: Secondary | ICD-10-CM | POA: Diagnosis not present

## 2022-03-02 DIAGNOSIS — E876 Hypokalemia: Secondary | ICD-10-CM | POA: Diagnosis not present

## 2022-03-02 DIAGNOSIS — Z9079 Acquired absence of other genital organ(s): Secondary | ICD-10-CM | POA: Diagnosis not present

## 2022-03-02 DIAGNOSIS — I10 Essential (primary) hypertension: Secondary | ICD-10-CM | POA: Diagnosis not present

## 2022-03-02 DIAGNOSIS — Z79899 Other long term (current) drug therapy: Secondary | ICD-10-CM | POA: Diagnosis not present

## 2022-03-02 MED ORDER — DENOSUMAB 120 MG/1.7ML ~~LOC~~ SOLN
120.0000 mg | Freq: Once | SUBCUTANEOUS | Status: AC
  Administered 2022-03-02: 120 mg via SUBCUTANEOUS
  Filled 2022-03-02: qty 1.7

## 2022-03-02 NOTE — Progress Notes (Signed)
Hematology and Oncology Follow Up Visit  Ethan [name] [id number] [birth year] 71 y.o. 03/02/2022 12:29 PM Ethan [name], MDJones, [name], MD   Principle Diagnosis: 71 year old man with advanced prostate cancer with lymphadenopathy and bone involvement diagnosed in 2019.  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    Prior Therapy:   He is status post orchiectomy completed by Ethan [name] on September 08, 2017.  Current therapy:  Zytiga 1000 mg daily with prednisone started in July 2019.  Xgeva on 120 mg every 8 weeks.    Interim History: Ethan [name] presents today for a follow-up visit.  Since last visit, he reports no major changes in his health.  He has received another epidural injection of the left spine with improvement in his back pain.  He denies any neurological deficits at this time.  He denies any nausea vomiting or abdominal pain.  Continues to exercise regularly and has intentionally lost weight.  He denies any complications related to Zytiga.  His performance status quality of life remains unchanged.  Medications: Updated on review. Current Outpatient Medications  Medication Sig Dispense Refill   abiraterone acetate (ZYTIGA) 250 MG tablet Take 4 tablets (1,000 mg total) by mouth daily. Take on an empty stomach 1 hour before or 2 hours after a meal 120 tablet 0   Calcium Carb-Cholecalciferol (CALTRATE 600+D3 PO) Take 2 tablets by mouth daily.     denosumab (XGEVA) 120 MG/1.7ML SOLN injection 1.7 ml     hydrochlorothiazide (MICROZIDE) 12.5 MG capsule TAKE 1 CAPSULE BY MOUTH EVERY DAY 90 capsule 1   KLOR-CON M20 20 MEQ tablet TAKE 1 TABLET BY MOUTH EVERY DAY 90 tablet 1   Multiple Vitamin (MULTIVITAMIN) tablet Take 1 tablet by mouth daily.     predniSONE (DELTASONE) 5 MG tablet TAKE 1 TABLET BY MOUTH EVERY DAY WITH BREAKFAST 90 tablet 3   tamsulosin (FLOMAX) 0.4 MG CAPS capsule TAKE 1 CAPSULE BY MOUTH EVERY DAY AFTER SUPPER 90 capsule 1    valsartan (DIOVAN) 80 MG tablet TAKE 1 TABLET BY MOUTH EVERY DAY 90 tablet 1   zolpidem (AMBIEN) 10 MG tablet Take 0.5 tablets (5 mg total) by mouth at bedtime as needed for sleep. 30 tablet 1   Current Facility-Administered Medications  Medication Dose Route Frequency Provider Last Rate Last Admin   betamethasone acetate-betamethasone sodium phosphate (CELESTONE) injection 6 mg  6 mg Other Once Ethan [name], DPM         Allergies: No Known Allergies      Physical Exam:        Blood pressure 127/72, pulse 62, temperature 97.8 F (36.6 C), temperature source Temporal, resp. rate 17, height '5\' 10"'$  (1.778 m), weight 173 lb 4.8 oz (78.6 kg), SpO2 100 %.      ECOG: 0    General appearance: Comfortable appearing without any discomfort Head: Normocephalic without any trauma Oropharynx: Mucous membranes are moist and pink without any thrush or ulcers. Eyes: Pupils are equal and round reactive to light. Lymph nodes: No cervical, supraclavicular, inguinal or axillary lymphadenopathy.   Heart:regular rate and rhythm.  S1 and S2 without leg edema. Lung: Clear without any rhonchi or wheezes.  No dullness to percussion. Abdomin: Soft, nontender, nondistended with good bowel sounds.  No hepatosplenomegaly. Musculoskeletal: No joint deformity or effusion.  Full range of motion noted. Neurological: No deficits noted on motor, sensory and deep tendon reflex exam. Skin: No petechial rash or dryness.  Appeared  moist.    .             .             Lab Results: Lab Results  Component Value Date   WBC 8.1 02/24/2022   HGB 14.7 02/24/2022   HCT 43.8 02/24/2022   MCV 93.0 02/24/2022   PLT 304 02/24/2022   PSA 9.00 08/25/2020     Chemistry      Component Value Date/Time   NA 140 02/24/2022 0804   K 4.5 02/24/2022 0804   CL 101 02/24/2022 0804   CO2 32 02/24/2022 0804   BUN 20 02/24/2022 0804   BUN 22 (A) 08/25/2020 0000   CREATININE 0.91  02/24/2022 0804      Component Value Date/Time   CALCIUM 10.3 02/24/2022 0804   ALKPHOS 52 02/24/2022 0804   AST 18 02/24/2022 0804   ALT 15 02/24/2022 0804   BILITOT 1.1 02/24/2022 0804       Latest Reference Range & Units 10/23/21 07:25 12/30/21 07:46 02/24/22 08:04  Prostate Specific Ag, Serum 0.0 - 4.0 ng/mL 5.0 (H) 6.2 (H) 6.6 (H)  (H): Data is abnormally high    Impression and Plan:   71 year old man with:   1.  Advanced prostate cancer with disease to the bone diagnosed in 2019.  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  His disease status was updated at this time and treatment choices were reviewed.  His PSA is showing slow rise although overall his disease remains stable.  PSMA PET scan obtained on February 26, 2022 showed no substantial changes compared to the previous imaging studies obtained earlier in the year.  Risks and benefits of continuing this treatment versus using different salvage options were discussed.  These options include Taxotere chemotherapy or Pluvicto if he gets approved for chemotherapy nave population.  After discussion today, I we opted to continue with Zytiga given his overall disease stability.   2.  Androgen deprivation: He is status post orchiectomy without any need for additional androgen deprivation.   3.  Hypertension: His blood pressure remains under reasonable control we will continue to monitor on Zytiga.   4.    Bone directed therapy: Risks and benefits of continuing Xgeva were discussed.  Complications including osteonecrosis of the jaw and hypocalcemia were reiterated.   5.  Prognosis and goals of care: Therapy remains palliative although aggressive measures are warranted at this time.  6.  Hypokalemia: His potassium is within normal range and will continue to monitor on Zytiga.  7.  Back pain: Predominantly on the left lower back consistent with sciatica rather than related to prostate cancer.  His pet imaging did not  correlate with the focal area of pain that could be radiated.  He did receive epidural injection with improvement in his pain.   8.  Follow-up: In 2 months for the next evaluation and injection.   30 minutes were spent on this visit.  The time was dedicated to updating disease status, treatment choices and outlining future plan of care discussion.  Zola Button, MD 12/19/202312:29 PM

## 2022-03-02 NOTE — Patient Instructions (Signed)
Denosumab Injection (Oncology) What is this medication? DENOSUMAB (den oh SUE mab) prevents weakened bones caused by cancer. It may also be used to treat noncancerous bone tumors that cannot be removed by surgery. It can also be used to treat high calcium levels in the blood caused by cancer. It works by blocking a protein that causes bones to break down quickly. This slows down the release of calcium from bones, which lowers calcium levels in your blood. It also makes your bones stronger and less likely to break (fracture). This medicine may be used for other purposes; ask your health care provider or pharmacist if you have questions. COMMON BRAND NAME(S): XGEVA What should I tell my care team before I take this medication? They need to know if you have any of these conditions: Dental disease Having surgery or tooth extraction Infection Kidney disease Low levels of calcium or vitamin D in the blood Malnutrition On hemodialysis Skin conditions or sensitivity Thyroid or parathyroid disease An unusual reaction to denosumab, other medications, foods, dyes, or preservatives Pregnant or trying to get pregnant Breast-feeding How should I use this medication? This medication is for injection under the skin. It is given by your care team in a hospital or clinic setting. A special MedGuide will be given to you before each treatment. Be sure to read this information carefully each time. Talk to your care team about the use of this medication in children. While it may be prescribed for children as young as 13 years for selected conditions, precautions do apply. Overdosage: If you think you have taken too much of this medicine contact a poison control center or emergency room at once. NOTE: This medicine is only for you. Do not share this medicine with others. What if I miss a dose? Keep appointments for follow-up doses. It is important not to miss your dose. Call your care team if you are unable to  keep an appointment. What may interact with this medication? Do not take this medication with any of the following: Other medications containing denosumab This medication may also interact with the following: Medications that lower your chance of fighting infection Steroid medications, such as prednisone or cortisone This list may not describe all possible interactions. Give your health care provider a list of all the medicines, herbs, non-prescription drugs, or dietary supplements you use. Also tell them if you smoke, drink alcohol, or use illegal drugs. Some items may interact with your medicine. What should I watch for while using this medication? Your condition will be monitored carefully while you are receiving this medication. You may need blood work while taking this medication. This medication may increase your risk of getting an infection. Call your care team for advice if you get a fever, chills, sore throat, or other symptoms of a cold or flu. Do not treat yourself. Try to avoid being around people who are sick. You should make sure you get enough calcium and vitamin D while you are taking this medication, unless your care team tells you not to. Discuss the foods you eat and the vitamins you take with your care team. Some people who take this medication have severe bone, joint, or muscle pain. This medication may also increase your risk for jaw problems or a broken thigh bone. Tell your care team right away if you have severe pain in your jaw, bones, joints, or muscles. Tell your care team if you have any pain that does not go away or that gets worse. Talk   to your care team if you may be pregnant. Serious birth defects can occur if you take this medication during pregnancy and for 5 months after the last dose. You will need a negative pregnancy test before starting this medication. Contraception is recommended while taking this medication and for 5 months after the last dose. Your care team  can help you find the option that works for you. What side effects may I notice from receiving this medication? Side effects that you should report to your care team as soon as possible: Allergic reactions--skin rash, itching, hives, swelling of the face, lips, tongue, or throat Bone, joint, or muscle pain Low calcium level--muscle pain or cramps, confusion, tingling, or numbness in the hands or feet Osteonecrosis of the jaw--pain, swelling, or redness in the mouth, numbness of the jaw, poor healing after dental work, unusual discharge from the mouth, visible bones in the mouth Side effects that usually do not require medical attention (report to your care team if they continue or are bothersome): Cough Diarrhea Fatigue Headache Nausea This list may not describe all possible side effects. Call your doctor for medical advice about side effects. You may report side effects to FDA at 1-800-FDA-1088. Where should I keep my medication? This medication is given in a hospital or clinic. It will not be stored at home. NOTE: This sheet is a summary. It may not cover all possible information. If you have questions about this medicine, talk to your doctor, pharmacist, or health care provider.  2023 Elsevier/Gold Standard (2021-07-20 00:00:00)  

## 2022-03-04 ENCOUNTER — Telehealth: Payer: Self-pay

## 2022-03-04 ENCOUNTER — Encounter: Payer: Self-pay | Admitting: Oncology

## 2022-03-04 NOTE — Telephone Encounter (Signed)
Oral Oncology Pharmacist Encounter  Received message from Dr. Alen Blew regarding patients interest in taking less zytiga pills with the low fat breakfast. Spoke with patient and discussed how to take zytiga of only 1 pill not the 4 pills and discussed with patient the different food options. Additionally, a document was mailed and emailed to him that have other options of foods to eat.   Patient agreed with the plan and appreciated the information.  Drema Halon, PharmD Hematology/Oncology Clinical Pharmacist [facility] [phone]

## 2022-03-19 ENCOUNTER — Other Ambulatory Visit (HOSPITAL_COMMUNITY): Payer: Self-pay

## 2022-03-22 ENCOUNTER — Other Ambulatory Visit (HOSPITAL_COMMUNITY): Payer: Self-pay

## 2022-03-23 ENCOUNTER — Other Ambulatory Visit: Payer: Self-pay | Admitting: *Deleted

## 2022-03-23 ENCOUNTER — Other Ambulatory Visit (HOSPITAL_COMMUNITY): Payer: Self-pay

## 2022-03-23 DIAGNOSIS — C61 Malignant neoplasm of prostate: Secondary | ICD-10-CM

## 2022-03-23 MED ORDER — ABIRATERONE ACETATE 250 MG PO TABS
250.0000 mg | ORAL_TABLET | Freq: Every day | ORAL | 0 refills | Status: DC
  Filled 2022-03-23: qty 30, 30d supply, fill #0

## 2022-04-03 ENCOUNTER — Encounter: Payer: Self-pay | Admitting: Oncology

## 2022-04-20 ENCOUNTER — Other Ambulatory Visit (HOSPITAL_COMMUNITY): Payer: Self-pay

## 2022-04-29 ENCOUNTER — Encounter: Payer: Self-pay | Admitting: Hematology

## 2022-05-03 ENCOUNTER — Inpatient Hospital Stay: Payer: Medicare Other | Attending: Oncology

## 2022-05-03 DIAGNOSIS — C61 Malignant neoplasm of prostate: Secondary | ICD-10-CM | POA: Diagnosis not present

## 2022-05-03 DIAGNOSIS — Z79899 Other long term (current) drug therapy: Secondary | ICD-10-CM | POA: Diagnosis not present

## 2022-05-03 DIAGNOSIS — C7951 Secondary malignant neoplasm of bone: Secondary | ICD-10-CM | POA: Insufficient documentation

## 2022-05-03 DIAGNOSIS — Z9079 Acquired absence of other genital organ(s): Secondary | ICD-10-CM | POA: Insufficient documentation

## 2022-05-03 LAB — CBC WITH DIFFERENTIAL (CANCER CENTER ONLY)
Abs Immature Granulocytes: 0.02 10*3/uL (ref 0.00–0.07)
Basophils Absolute: 0 10*3/uL (ref 0.0–0.1)
Basophils Relative: 0 %
Eosinophils Absolute: 0.3 10*3/uL (ref 0.0–0.5)
Eosinophils Relative: 4 %
HCT: 38.3 % — ABNORMAL LOW (ref 39.0–52.0)
Hemoglobin: 13.1 g/dL (ref 13.0–17.0)
Immature Granulocytes: 0 %
Lymphocytes Relative: 29 %
Lymphs Abs: 1.9 10*3/uL (ref 0.7–4.0)
MCH: 31.8 pg (ref 26.0–34.0)
MCHC: 34.2 g/dL (ref 30.0–36.0)
MCV: 93 fL (ref 80.0–100.0)
Monocytes Absolute: 0.7 10*3/uL (ref 0.1–1.0)
Monocytes Relative: 10 %
Neutro Abs: 3.8 10*3/uL (ref 1.7–7.7)
Neutrophils Relative %: 57 %
Platelet Count: 290 10*3/uL (ref 150–400)
RBC: 4.12 MIL/uL — ABNORMAL LOW (ref 4.22–5.81)
RDW: 12.2 % (ref 11.5–15.5)
WBC Count: 6.7 10*3/uL (ref 4.0–10.5)
nRBC: 0 % (ref 0.0–0.2)

## 2022-05-03 LAB — CMP (CANCER CENTER ONLY)
ALT: 15 U/L (ref 0–44)
AST: 17 U/L (ref 15–41)
Albumin: 4.3 g/dL (ref 3.5–5.0)
Alkaline Phosphatase: 44 U/L (ref 38–126)
Anion gap: 5 (ref 5–15)
BUN: 28 mg/dL — ABNORMAL HIGH (ref 8–23)
CO2: 30 mmol/L (ref 22–32)
Calcium: 9.6 mg/dL (ref 8.9–10.3)
Chloride: 104 mmol/L (ref 98–111)
Creatinine: 0.81 mg/dL (ref 0.61–1.24)
GFR, Estimated: >60 mL/min (ref >60)
Glucose, Bld: 102 mg/dL — ABNORMAL HIGH (ref 70–99)
Potassium: 4.2 mmol/L (ref 3.5–5.1)
Sodium: 139 mmol/L (ref 135–145)
Total Bilirubin: 0.6 mg/dL (ref 0.3–1.2)
Total Protein: 6.5 g/dL (ref 6.5–8.1)

## 2022-05-05 LAB — PROSTATE-SPECIFIC AG, SERUM (LABCORP): Prostate Specific Ag, Serum: 4.4 ng/mL — ABNORMAL HIGH (ref 0.0–4.0)

## 2022-05-05 NOTE — Assessment & Plan Note (Signed)
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 -Status post orchiectomy in June 2019. -Currently on Zytiga 1000 mg daily with prednisone started in July 2019, Xgeva 120 mg every 8 weeks -last PSMA scan in December 2023 showed stable disease in prostate, he did activity for this and bone lesions. -His PSA has been overall stable around 4-6

## 2022-05-05 NOTE — Progress Notes (Unsigned)
[place]   [phone] [phone]   Clinic Follow up Note   Patient Care Team: [name], MD as PCP - General (Internal Medicine)  Date of Service:  05/06/2022  CHIEF COMPLAINT: f/u of  Prostate Cancer  CURRENT THERAPY:   Zytiga 1000 mg daily with prednisone started in July 2019.   Xgeva on 120 mg every 8 weeks.   ASSESSMENT:  Ethan [name] is a 72 y.o. 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  Castration sensitive disease. -Status post orchiectomy in June 2019. -Currently on Zytiga 1000 mg daily with prednisone started in July 2019, Xgeva 120 mg every 8 weeks -last PSMA scan in December 2023 showed stable disease in prostate, he did activity for this and bone lesions. -His PSA has been overall stable around 4-6 -He is clinically doing very well, will continue Zytiga, prednisone and Xgeva. -He has been on Xgeva for 5 years, will change to every 3 months. -Follow-up in 3 months    PLAN: -Lab reviewed, will proceed Xgeva injection today  -discuss PSA level -Discuss previous PET scan- stable -I refill Potassium -I refill Zytiga and prednisone -I refilled Flomax to [facility], and give him the mail pharmacy phone number to follow-up -lab,f/u in 3 months with next Xgeva injection   SUMMARY OF ONCOLOGIC HISTORY: Oncology History Overview Note  Negative genetic testing   Prostate cancer ([place])  10/27/2017 Initial Diagnosis   Prostate cancer ([place])   12/26/2017 Genetic Testing   Negative genetic testing on the multii-cancer panel.  The Multi-Gene Panel offered by [facility] includes sequencing and/or deletion duplication testing of the following 84 genes: AIP, ALK, APC, ATM, AXIN2,BAP1,  BARD1, BLM, BMPR1A, BRCA1, BRCA2, BRIP1, CASR, CDC73, CDH1, CDK4, CDKN1B, CDKN1C, CDKN2A (p14ARF), CDKN2A (p16INK4a), CEBPA, CHEK2, CTNNA1, DICER1, DIS3L2, EGFR (c.2369C>T, p.Thr790Met  variant only), EPCAM (Deletion/duplication testing only), FH, FLCN, GATA2, GPC3, GREM1 (Promoter region deletion/duplication testing only), HOXB13 (c.251G>A, p.Gly84Glu), HRAS, KIT, MAX, MEN1, MET, MITF (c.952G>A, p.Glu318Lys variant only), MLH1, MSH2, MSH3, MSH6, MUTYH, NBN, NF1, NF2, NTHL1, PALB2, PDGFRA, PHOX2B, PMS2, POLD1, POLE, POT1, PRKAR1A, PTCH1, PTEN, RAD50, RAD51C, RAD51D, RB1, RECQL4, RET, RUNX1, SDHAF2, SDHA (sequence changes only), SDHB, SDHC, SDHD, SMAD4, SMARCA4, SMARCB1, SMARCE1, STK11, SUFU, TERC, TERT, TMEM127, TP53, TSC1, TSC2, VHL, WRN and WT1.  The report date is December 26, 2017.      INTERVAL HISTORY:  Ethan [name] is here for a follow up of Prostate Cancer He was last seen by  Dr.Shadad on 03/02/2022 He presents to the clinic alone. Pt reports he feels very good, denies having pain, Pt does a brisk walk everyday. Pt states he has no problems from the [place]. Pt states he has good energy level.    All other systems were reviewed with the patient and are negative.  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    Prostate cancer metastatic to multiple sites [facility]) urologist-  dr Jeffie Pollock   dx 08-25-2017--  Gleason 9, PSA 542,  vol 67cc--  METs to bone (multiple sites) and lymph nodes   Wears glasses     SURGICAL HISTORY: Past Surgical History:  Procedure Laterality Date   APPENDECTOMY  1963   COLONOSCOPY WITH PROPOFOL  2017   ORCHIECTOMY Bilateral 09/08/2017   Procedure: ORCHIECTOMY;  Surgeon: Irine Seal, MD;  Location: [facility];  Service: Urology;  Laterality: Bilateral;   ORIF HUMERUS FRACTURE Left 05/12/2018   Procedure: OPEN REDUCTION INTERNAL FIXATION (ORIF) LEFT PROXIMAL HUMERUS FRACTURE;  Surgeon: Altamese Independence, MD;  Location: [place];  Service: Orthopedics;  Laterality: Left;    I have reviewed the social history and family history with the patient and they are  unchanged from previous note.  ALLERGIES:  has No Known Allergies.  MEDICATIONS:  Current Outpatient Medications  Medication Sig Dispense Refill   abiraterone acetate (ZYTIGA) 250 MG tablet Take 1 tablet (250 mg total) by mouth daily. Take with a low fat meal 30 tablet 2   Calcium Carb-Cholecalciferol (CALTRATE 600+D3 PO) Take 2 tablets by mouth daily.     denosumab (XGEVA) 120 MG/1.7ML SOLN injection 1.7 ml     hydrochlorothiazide (MICROZIDE) 12.5 MG capsule TAKE 1 CAPSULE BY MOUTH EVERY DAY 90 capsule 1   Multiple Vitamin (MULTIVITAMIN) tablet Take 1 tablet by mouth daily.     potassium chloride SA (KLOR-CON M20) 20 MEQ tablet Take 1 tablet (20 mEq total) by mouth daily. 90 tablet 1   predniSONE (DELTASONE) 5 MG tablet TAKE 1 TABLET BY MOUTH EVERY DAY WITH BREAKFAST 90 tablet 3   tamsulosin (FLOMAX) 0.4 MG CAPS capsule TAKE 1 CAPSULE BY MOUTH EVERY DAY AFTER SUPPER 90 capsule 1   valsartan (DIOVAN) 80 MG tablet TAKE 1 TABLET BY MOUTH EVERY DAY 90 tablet 1   zolpidem (AMBIEN) 10 MG tablet Take 0.5 tablets (5 mg total) by mouth at bedtime as needed for sleep. 30 tablet 1   Current Facility-Administered Medications  Medication Dose Route Frequency Provider Last Rate Last Admin   betamethasone acetate-betamethasone sodium phosphate (CELESTONE) injection 6 mg  6 mg Other Once Evelina Bucy, DPM       Facility-Administered Medications Ordered in Other Visits  Medication Dose Route Frequency Provider Last Rate Last Admin   denosumab (XGEVA) injection 120 mg  120 mg Subcutaneous Once Truitt Merle, MD        PHYSICAL EXAMINATION: ECOG PERFORMANCE STATUS: 0 - Asymptomatic  Vitals:   05/06/22 0812  BP: 123/79  Pulse: 76  Resp: 16  Temp: 98.5 F (36.9 C)  SpO2: 100%   Wt Readings from Last 3 Encounters:  05/06/22 182 lb (82.6 kg)  03/02/22 173 lb 4.8 oz (78.6 kg)  02/09/22 188 lb (85.3 kg)      NECK:(-)  supple, thyroid normal size, non-tender, without nodularity LYMPH: (-)  no  palpable lymphadenopathy in the cervical, axillary  LUNGS: (-) clear to auscultation and percussion with normal breathing effort HEART: (-) regular rate & rhythm and no murmurs and (-) no lower extremity edema ABDOMEN:(-) abdomen soft, (-) non-tender and normal bowel sounds    LABORATORY DATA:  I have reviewed the data as listed    Latest Ref Rng & Units 05/03/2022    7:33 AM 02/24/2022    8:04 AM 12/30/2021    7:46 AM  CBC  WBC 4.0 - 10.5 K/uL 6.7  8.1  8.9   Hemoglobin 13.0 - 17.0 g/dL 13.1  14.7  14.4   Hematocrit 39.0 - 52.0 % 38.3  43.8  42.7   Platelets 150 - 400 K/uL 290  304  305         Latest Ref Rng & Units 05/03/2022    7:33 AM 02/24/2022    8:04 AM 12/30/2021    7:46 AM  CMP  Glucose 70 - 99 mg/dL 102  113  111   BUN  8 - 23 mg/dL 28  20  16   $ Creatinine 0.61 - 1.24 mg/dL 0.81  0.91  0.91   Sodium 135 - 145 mmol/L 139  140  137   Potassium 3.5 - 5.1 mmol/L 4.2  4.5  4.3   Chloride 98 - 111 mmol/L 104  101  104   CO2 22 - 32 mmol/L 30  32  27   Calcium 8.9 - 10.3 mg/dL 9.6  10.3  9.5   Total Protein 6.5 - 8.1 g/dL 6.5  6.8  6.9   Total Bilirubin 0.3 - 1.2 mg/dL 0.6  1.1  1.0   Alkaline Phos 38 - 126 U/L 44  52  56   AST 15 - 41 U/L 17  18  19   $ ALT 0 - 44 U/L 15  15  14       $ RADIOGRAPHIC STUDIES: I have personally reviewed the radiological images as listed and agreed with the findings in the report. No results found.    No orders of the defined types were placed in this encounter.  All questions were answered. The patient knows to call the clinic with any problems, questions or concerns. No barriers to learning was detected. The total time spent in the appointment was 40 minutes.     Truitt Merle, MD 05/06/2022   Felicity Coyer, CMA, am acting as scribe for Truitt Merle, MD.   I have reviewed the above documentation for accuracy and completeness, and I agree with the above.

## 2022-05-06 ENCOUNTER — Inpatient Hospital Stay: Payer: Medicare Other

## 2022-05-06 ENCOUNTER — Inpatient Hospital Stay (HOSPITAL_BASED_OUTPATIENT_CLINIC_OR_DEPARTMENT_OTHER): Payer: Medicare Other | Admitting: Hematology

## 2022-05-06 ENCOUNTER — Other Ambulatory Visit (HOSPITAL_COMMUNITY): Payer: Self-pay

## 2022-05-06 ENCOUNTER — Encounter: Payer: Self-pay | Admitting: Hematology

## 2022-05-06 VITALS — BP 123/79 | HR 76 | Temp 98.5°F | Resp 16 | Wt 182.0 lb

## 2022-05-06 DIAGNOSIS — Z9079 Acquired absence of other genital organ(s): Secondary | ICD-10-CM | POA: Diagnosis not present

## 2022-05-06 DIAGNOSIS — C61 Malignant neoplasm of prostate: Secondary | ICD-10-CM

## 2022-05-06 DIAGNOSIS — Z79899 Other long term (current) drug therapy: Secondary | ICD-10-CM | POA: Diagnosis not present

## 2022-05-06 DIAGNOSIS — C7951 Secondary malignant neoplasm of bone: Secondary | ICD-10-CM | POA: Diagnosis not present

## 2022-05-06 MED ORDER — DENOSUMAB 120 MG/1.7ML ~~LOC~~ SOLN
120.0000 mg | Freq: Once | SUBCUTANEOUS | Status: AC
  Administered 2022-05-06: 120 mg via SUBCUTANEOUS
  Filled 2022-05-06: qty 1.7

## 2022-05-06 MED ORDER — PREDNISONE 5 MG PO TABS: ORAL_TABLET | ORAL | 3 refills | Status: DC

## 2022-05-06 MED ORDER — POTASSIUM CHLORIDE CRYS ER 20 MEQ PO TBCR: 20.0000 meq | EXTENDED_RELEASE_TABLET | Freq: Every day | ORAL | 1 refills | Status: DC

## 2022-05-06 MED ORDER — TAMSULOSIN HCL 0.4 MG PO CAPS: ORAL_CAPSULE | ORAL | 1 refills | Status: DC

## 2022-05-06 MED ORDER — ABIRATERONE ACETATE 250 MG PO TABS
250.0000 mg | ORAL_TABLET | Freq: Every day | ORAL | 2 refills | Status: DC
  Filled 2022-05-06 – 2022-06-08 (×2): qty 30, 30d supply, fill #0
  Filled 2022-07-22: qty 30, 30d supply, fill #1

## 2022-05-27 ENCOUNTER — Other Ambulatory Visit: Payer: Self-pay

## 2022-05-27 ENCOUNTER — Other Ambulatory Visit (HOSPITAL_COMMUNITY): Payer: Self-pay

## 2022-05-27 DIAGNOSIS — C61 Malignant neoplasm of prostate: Secondary | ICD-10-CM

## 2022-06-08 ENCOUNTER — Other Ambulatory Visit (HOSPITAL_COMMUNITY): Payer: Self-pay

## 2022-06-17 ENCOUNTER — Other Ambulatory Visit (HOSPITAL_COMMUNITY): Payer: Self-pay

## 2022-06-17 ENCOUNTER — Encounter: Payer: Self-pay | Admitting: Hematology

## 2022-06-22 ENCOUNTER — Other Ambulatory Visit: Payer: Self-pay | Admitting: Hematology

## 2022-06-22 DIAGNOSIS — C61 Malignant neoplasm of prostate: Secondary | ICD-10-CM

## 2022-06-22 NOTE — Telephone Encounter (Signed)
Pt will need to contact his PCP regarding his BP medications.

## 2022-07-22 ENCOUNTER — Other Ambulatory Visit (HOSPITAL_COMMUNITY): Payer: Self-pay

## 2022-07-27 ENCOUNTER — Other Ambulatory Visit (HOSPITAL_COMMUNITY): Payer: Self-pay

## 2022-07-30 ENCOUNTER — Encounter: Payer: Self-pay | Admitting: Hematology

## 2022-08-02 ENCOUNTER — Inpatient Hospital Stay: Payer: Medicare Other | Attending: Hematology

## 2022-08-02 DIAGNOSIS — C7951 Secondary malignant neoplasm of bone: Secondary | ICD-10-CM | POA: Diagnosis not present

## 2022-08-02 DIAGNOSIS — C61 Malignant neoplasm of prostate: Secondary | ICD-10-CM | POA: Diagnosis not present

## 2022-08-02 DIAGNOSIS — Z9079 Acquired absence of other genital organ(s): Secondary | ICD-10-CM | POA: Diagnosis not present

## 2022-08-02 DIAGNOSIS — Z7952 Long term (current) use of systemic steroids: Secondary | ICD-10-CM | POA: Diagnosis not present

## 2022-08-02 DIAGNOSIS — Z79899 Other long term (current) drug therapy: Secondary | ICD-10-CM | POA: Diagnosis not present

## 2022-08-02 LAB — CMP (CANCER CENTER ONLY)
ALT: 18 U/L (ref 0–44)
AST: 19 U/L (ref 15–41)
Albumin: 4.6 g/dL (ref 3.5–5.0)
Alkaline Phosphatase: 52 U/L (ref 38–126)
Anion gap: 7 (ref 5–15)
BUN: 18 mg/dL (ref 8–23)
CO2: 27 mmol/L (ref 22–32)
Calcium: 9.6 mg/dL (ref 8.9–10.3)
Chloride: 104 mmol/L (ref 98–111)
Creatinine: 0.94 mg/dL (ref 0.61–1.24)
GFR, Estimated: >60 mL/min (ref >60)
Glucose, Bld: 130 mg/dL — ABNORMAL HIGH (ref 70–99)
Potassium: 4.5 mmol/L (ref 3.5–5.1)
Sodium: 138 mmol/L (ref 135–145)
Total Bilirubin: 0.8 mg/dL (ref 0.3–1.2)
Total Protein: 6.8 g/dL (ref 6.5–8.1)

## 2022-08-02 LAB — CBC WITH DIFFERENTIAL (CANCER CENTER ONLY)
Abs Immature Granulocytes: 0.02 10*3/uL (ref 0.00–0.07)
Basophils Absolute: 0 10*3/uL (ref 0.0–0.1)
Basophils Relative: 0 %
Eosinophils Absolute: 0.2 10*3/uL (ref 0.0–0.5)
Eosinophils Relative: 3 %
HCT: 41.7 % (ref 39.0–52.0)
Hemoglobin: 14.6 g/dL (ref 13.0–17.0)
Immature Granulocytes: 0 %
Lymphocytes Relative: 23 %
Lymphs Abs: 1.7 10*3/uL (ref 0.7–4.0)
MCH: 31.6 pg (ref 26.0–34.0)
MCHC: 35 g/dL (ref 30.0–36.0)
MCV: 90.3 fL (ref 80.0–100.0)
Monocytes Absolute: 0.6 10*3/uL (ref 0.1–1.0)
Monocytes Relative: 9 %
Neutro Abs: 4.5 10*3/uL (ref 1.7–7.7)
Neutrophils Relative %: 65 %
Platelet Count: 277 10*3/uL (ref 150–400)
RBC: 4.62 MIL/uL (ref 4.22–5.81)
RDW: 11.7 % (ref 11.5–15.5)
WBC Count: 7.1 10*3/uL (ref 4.0–10.5)
nRBC: 0 % (ref 0.0–0.2)

## 2022-08-02 LAB — PSA: PSA: 3.9

## 2022-08-03 LAB — PROSTATE-SPECIFIC AG, SERUM (LABCORP): Prostate Specific Ag, Serum: 3.9 ng/mL (ref 0.0–4.0)

## 2022-08-05 ENCOUNTER — Inpatient Hospital Stay: Payer: Medicare Other

## 2022-08-05 ENCOUNTER — Other Ambulatory Visit: Payer: Self-pay

## 2022-08-05 ENCOUNTER — Other Ambulatory Visit (HOSPITAL_COMMUNITY): Payer: Self-pay

## 2022-08-05 ENCOUNTER — Encounter: Payer: Self-pay | Admitting: Hematology

## 2022-08-05 ENCOUNTER — Inpatient Hospital Stay (HOSPITAL_BASED_OUTPATIENT_CLINIC_OR_DEPARTMENT_OTHER): Payer: Medicare Other | Admitting: Hematology

## 2022-08-05 VITALS — BP 115/79 | HR 76 | Temp 98.0°F | Resp 16 | Wt 181.9 lb

## 2022-08-05 DIAGNOSIS — C7951 Secondary malignant neoplasm of bone: Secondary | ICD-10-CM | POA: Diagnosis not present

## 2022-08-05 DIAGNOSIS — Z9079 Acquired absence of other genital organ(s): Secondary | ICD-10-CM | POA: Diagnosis not present

## 2022-08-05 DIAGNOSIS — Z79899 Other long term (current) drug therapy: Secondary | ICD-10-CM | POA: Diagnosis not present

## 2022-08-05 DIAGNOSIS — Z7952 Long term (current) use of systemic steroids: Secondary | ICD-10-CM | POA: Diagnosis not present

## 2022-08-05 DIAGNOSIS — C61 Malignant neoplasm of prostate: Secondary | ICD-10-CM | POA: Diagnosis not present

## 2022-08-05 MED ORDER — ZOLPIDEM TARTRATE 5 MG PO TABS: 5.0000 mg | ORAL_TABLET | Freq: Every evening | ORAL | 0 refills | Status: DC | PRN

## 2022-08-05 MED ORDER — TAMSULOSIN HCL 0.4 MG PO CAPS: ORAL_CAPSULE | ORAL | 1 refills | Status: DC

## 2022-08-05 MED ORDER — ABIRATERONE ACETATE 250 MG PO TABS
250.0000 mg | ORAL_TABLET | Freq: Every day | ORAL | 2 refills | Status: DC
  Filled 2022-08-05 – 2022-08-19 (×2): qty 30, 30d supply, fill #0
  Filled 2022-09-20: qty 30, 30d supply, fill #1
  Filled 2022-10-14: qty 30, 30d supply, fill #2

## 2022-08-05 NOTE — Assessment & Plan Note (Signed)
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 -Status post orchiectomy in June 2019. -Currently on Zytiga 1000 mg daily with prednisone started in July 2019, Xgeva 120 mg every 8 weeks -last PSMA scan in December 2023 showed stable disease in prostate, he did activity for this and bone lesions. -His PSA has been overall stable around 4-6 -He is clinically doing very well, will continue Zytiga, prednisone and Xgeva. -He has been on Xgeva for 5 years, will change to every 3 months. -Follow-up in 3 months

## 2022-08-05 NOTE — Progress Notes (Signed)
[facility]   Telephone:([phone] [phone]   Clinic Follow up Note   Patient Care Team: Etta Grandchild, MD as PCP - General (Internal Medicine) Malachy Mood, MD as Attending Physician (Hematology and Oncology)  Date of Service:  08/05/2022  CHIEF COMPLAINT: f/u of Prostate Cancer   CURRENT THERAPY:   Zytiga 1000 mg daily with prednisone started in July 2019.   Xgeva on 120 mg every 8 weeks.     ASSESSMENT:  Ethan [name] is a 72 y.o. 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  Castration sensitive disease. -Status post orchiectomy in June 2019. -Currently on Zytiga 1000 mg daily with prednisone started in July 2019, Xgeva 120 mg every 8 weeks -last PSMA scan in December 2023 showed stable disease in prostate, he did activity for this and bone lesions. -His PSA has been overall stable around 4-6 -He is clinically doing very well, will continue Zytiga, prednisone and Xgeva. -Recent lab reviewed, PSA down to 3.6, CBC and CMP are unremarkable, he is clinically doing very well without any symptoms. -He has been on Xgeva for 5 years, due to high copay, will change to every 6 months. -Follow-up in 3 months    PLAN: -lab reviewed -I refill Zytiga -Plan to change Xgeva to q6 months -cancel injection today -next Xgeva injection in 3 months -lab and f/u in 3 months  -I refilled Ambien, Flomax for him   SUMMARY OF ONCOLOGIC HISTORY: Oncology History Overview Note  Negative genetic testing   Prostate cancer ([facility])  10/27/2017 Initial Diagnosis   Prostate cancer ([facility])   12/26/2017 Genetic Testing   Negative genetic testing on the multii-cancer panel.  The Multi-Gene Panel offered by [facility] includes sequencing and/or deletion duplication testing of the following 84 genes: AIP, ALK, APC, ATM, AXIN2,BAP1,  BARD1, BLM, BMPR1A, BRCA1, BRCA2, BRIP1, CASR, CDC73, CDH1, CDK4, CDKN1B, CDKN1C, CDKN2A  (p14ARF), CDKN2A (p16INK4a), CEBPA, CHEK2, CTNNA1, DICER1, DIS3L2, EGFR (c.2369C>T, p.Thr790Met variant only), EPCAM (Deletion/duplication testing only), FH, FLCN, GATA2, GPC3, GREM1 (Promoter region deletion/duplication testing only), HOXB13 (c.251G>A, p.Gly84Glu), HRAS, KIT, MAX, MEN1, MET, MITF (c.952G>A, p.Glu318Lys variant only), MLH1, MSH2, MSH3, MSH6, MUTYH, NBN, NF1, NF2, NTHL1, PALB2, PDGFRA, PHOX2B, PMS2, POLD1, POLE, POT1, PRKAR1A, PTCH1, PTEN, RAD50, RAD51C, RAD51D, RB1, RECQL4, RET, RUNX1, SDHAF2, SDHA (sequence changes only), SDHB, SDHC, SDHD, SMAD4, SMARCA4, SMARCB1, SMARCE1, STK11, SUFU, TERC, TERT, TMEM127, TP53, TSC1, TSC2, VHL, WRN and WT1.  The report date is December 26, 2017.      INTERVAL HISTORY:  Ethan [name] is here for a follow up of Prostate Cancer. He was last seen by me on 05/06/2022. He presents to the clinic alone. Pt present  today in clinic in good spirits. His energy level is great. Pt denies having any other issues.    All other systems were reviewed with the patient and are negative.  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    Prostate cancer metastatic to multiple sites [facility]) urologist-  dr Annabell Howells   dx 08-25-2017--  Gleason 9, PSA 542,  vol 67cc--  METs to bone (multiple sites) and lymph nodes   Wears glasses     SURGICAL HISTORY: Past Surgical History:  Procedure Laterality Date   APPENDECTOMY  1963   COLONOSCOPY WITH PROPOFOL  2017   ORCHIECTOMY Bilateral 09/08/2017   Procedure: ORCHIECTOMY;  Surgeon: Annabell Howells,  Jonny Ruiz, MD;  Location: [facility];  Service: Urology;  Laterality: Bilateral;   ORIF HUMERUS FRACTURE Left 05/12/2018   Procedure: OPEN REDUCTION INTERNAL FIXATION (ORIF) LEFT PROXIMAL HUMERUS FRACTURE;  Surgeon: Myrene Galas, MD;  Location: [facility];  Service: Orthopedics;  Laterality: Left;    I have reviewed the social history and family history  with the patient and they are unchanged from previous note.  ALLERGIES:  has No Known Allergies.  MEDICATIONS:  Current Outpatient Medications  Medication Sig Dispense Refill   zolpidem (AMBIEN) 5 MG tablet Take 1 tablet (5 mg total) by mouth at bedtime as needed for sleep. 90 tablet 0   abiraterone acetate (ZYTIGA) 250 MG tablet Take 1 tablet (250 mg total) by mouth daily. Take with a low fat meal 30 tablet 2   Calcium Carb-Cholecalciferol (CALTRATE 600+D3 PO) Take 2 tablets by mouth daily.     denosumab (XGEVA) 120 MG/1.7ML SOLN injection 1.7 ml     hydrochlorothiazide (MICROZIDE) 12.5 MG capsule TAKE 1 CAPSULE BY MOUTH EVERY DAY 90 capsule 1   Multiple Vitamin (MULTIVITAMIN) tablet Take 1 tablet by mouth daily.     potassium chloride SA (KLOR-CON M20) 20 MEQ tablet Take 1 tablet (20 mEq total) by mouth daily. 90 tablet 1   predniSONE (DELTASONE) 5 MG tablet TAKE 1 TABLET BY MOUTH EVERY DAY WITH BREAKFAST 90 tablet 3   tamsulosin (FLOMAX) 0.4 MG CAPS capsule TAKE 1 CAPSULE BY MOUTH EVERY DAY AFTER SUPPER 90 capsule 1   valsartan (DIOVAN) 80 MG tablet TAKE 1 TABLET BY MOUTH EVERY DAY 90 tablet 1   Current Facility-Administered Medications  Medication Dose Route Frequency Provider Last Rate Last Admin   betamethasone acetate-betamethasone sodium phosphate (CELESTONE) injection 6 mg  6 mg Other Once Park Liter, DPM        PHYSICAL EXAMINATION: ECOG PERFORMANCE STATUS: 0 - Asymptomatic  Vitals:   08/05/22 0813  BP: 115/79  Pulse: 76  Resp: 16  Temp: 98 F (36.7 C)  SpO2: 100%   Wt Readings from Last 3 Encounters:  08/05/22 181 lb 14.4 oz (82.5 kg)  05/06/22 182 lb (82.6 kg)  03/02/22 173 lb 4.8 oz (78.6 kg)     GENERAL:alert, no distress and comfortable SKIN: skin color normal, no rashes or significant lesions EYES: normal, Conjunctiva are pink and non-injected, sclera clear  NEURO: alert & oriented x 3 with fluent speech NECK: (-) supple, thyroid normal size,  non-tender, without nodularity LYMPH:  (-) no palpable lymphadenopathy in the cervical, axillary  LUNGS: (-) clear to auscultation and percussion with normal breathing effort HEART: (-) regular rate & rhythm and no murmurs and no lower extremity edema ABDOMEN: (-) abdomen soft, (-) non-tender and (-) normal bowel sounds   LABORATORY DATA:  I have reviewed the data as listed    Latest Ref Rng & Units 08/02/2022    7:43 AM 05/03/2022    7:33 AM 02/24/2022    8:04 AM  CBC  WBC 4.0 - 10.5 K/uL 7.1  6.7  8.1   Hemoglobin 13.0 - 17.0 g/dL 09.8  11.9  14.7   Hematocrit 39.0 - 52.0 % 41.7  38.3  43.8   Platelets 150 - 400 K/uL 277  290  304         Latest Ref Rng & Units 08/02/2022    7:43 AM 05/03/2022    7:33 AM 02/24/2022    8:04 AM  CMP  Glucose 70 - 99 mg/dL 829  102  113   BUN 8 - 23 mg/dL 18  28  20    Creatinine 0.61 - 1.24 mg/dL 1.61  0.96  0.45   Sodium 135 - 145 mmol/L 138  139  140   Potassium 3.5 - 5.1 mmol/L 4.5  4.2  4.5   Chloride 98 - 111 mmol/L 104  104  101   CO2 22 - 32 mmol/L 27  30  32   Calcium 8.9 - 10.3 mg/dL 9.6  9.6  40.9   Total Protein 6.5 - 8.1 g/dL 6.8  6.5  6.8   Total Bilirubin 0.3 - 1.2 mg/dL 0.8  0.6  1.1   Alkaline Phos 38 - 126 U/L 52  44  52   AST 15 - 41 U/L 19  17  18    ALT 0 - 44 U/L 18  15  15        RADIOGRAPHIC STUDIES: I have personally reviewed the radiological images as listed and agreed with the findings in the report. No results found.    No orders of the defined types were placed in this encounter.  All questions were answered. The patient knows to call the clinic with any problems, questions or concerns. No barriers to learning was detected. The total time spent in the appointment was 30 minutes.     Malachy Mood, MD 08/05/2022   Carolin Coy, CMA, am acting as scribe for Malachy Mood, MD.   I have reviewed the above documentation for accuracy and completeness, and I agree with the above.

## 2022-08-19 ENCOUNTER — Other Ambulatory Visit (HOSPITAL_COMMUNITY): Payer: Self-pay

## 2022-08-19 ENCOUNTER — Other Ambulatory Visit: Payer: Self-pay

## 2022-08-24 ENCOUNTER — Other Ambulatory Visit: Payer: Self-pay | Admitting: Hematology

## 2022-08-24 ENCOUNTER — Other Ambulatory Visit: Payer: Self-pay

## 2022-08-24 NOTE — Telephone Encounter (Signed)
Per Santiago Glad, NP "OK To Hold Refill if pt can eat K+ rich foods".

## 2022-08-26 ENCOUNTER — Other Ambulatory Visit (HOSPITAL_COMMUNITY): Payer: Self-pay

## 2022-08-31 ENCOUNTER — Encounter: Payer: Self-pay | Admitting: Internal Medicine

## 2022-08-31 ENCOUNTER — Encounter: Payer: Self-pay | Admitting: Hematology

## 2022-08-31 ENCOUNTER — Other Ambulatory Visit: Payer: Self-pay

## 2022-08-31 DIAGNOSIS — C61 Malignant neoplasm of prostate: Secondary | ICD-10-CM

## 2022-08-31 MED ORDER — VALSARTAN 80 MG PO TABS: 80.0000 mg | ORAL_TABLET | Freq: Every day | ORAL | 0 refills | Status: DC

## 2022-09-07 ENCOUNTER — Ambulatory Visit (INDEPENDENT_AMBULATORY_CARE_PROVIDER_SITE_OTHER): Payer: Medicare Other | Admitting: Internal Medicine

## 2022-09-07 ENCOUNTER — Encounter: Payer: Self-pay | Admitting: Internal Medicine

## 2022-09-07 VITALS — BP 126/86 | HR 69 | Temp 97.9°F | Resp 16 | Ht 70.0 in | Wt 186.0 lb

## 2022-09-07 DIAGNOSIS — Z1211 Encounter for screening for malignant neoplasm of colon: Secondary | ICD-10-CM

## 2022-09-07 DIAGNOSIS — C61 Malignant neoplasm of prostate: Secondary | ICD-10-CM | POA: Diagnosis not present

## 2022-09-07 DIAGNOSIS — E785 Hyperlipidemia, unspecified: Secondary | ICD-10-CM | POA: Insufficient documentation

## 2022-09-07 DIAGNOSIS — Z0001 Encounter for general adult medical examination with abnormal findings: Secondary | ICD-10-CM | POA: Insufficient documentation

## 2022-09-07 DIAGNOSIS — I10 Essential (primary) hypertension: Secondary | ICD-10-CM

## 2022-09-07 DIAGNOSIS — R739 Hyperglycemia, unspecified: Secondary | ICD-10-CM | POA: Diagnosis not present

## 2022-09-07 DIAGNOSIS — Z0000 Encounter for general adult medical examination without abnormal findings: Secondary | ICD-10-CM | POA: Diagnosis not present

## 2022-09-07 DIAGNOSIS — Z23 Encounter for immunization: Secondary | ICD-10-CM

## 2022-09-07 DIAGNOSIS — R9431 Abnormal electrocardiogram [ECG] [EKG]: Secondary | ICD-10-CM | POA: Insufficient documentation

## 2022-09-07 LAB — BASIC METABOLIC PANEL
BUN: 23 mg/dL (ref 6–23)
CO2: 27 mEq/L (ref 19–32)
Calcium: 10.3 mg/dL (ref 8.4–10.5)
Chloride: 101 mEq/L (ref 96–112)
Creatinine, Ser: 0.91 mg/dL (ref 0.40–1.50)
GFR: 84.01 mL/min (ref >60.00)
Glucose, Bld: 101 mg/dL — ABNORMAL HIGH (ref 70–99)
Potassium: 4.3 mEq/L (ref 3.5–5.1)
Sodium: 139 mEq/L (ref 135–145)

## 2022-09-07 LAB — LIPID PANEL
Cholesterol: 160 mg/dL (ref 0–200)
HDL: 42.8 mg/dL (ref >39.00)
LDL Cholesterol: 80 mg/dL (ref 0–99)
NonHDL: 117.69
Total CHOL/HDL Ratio: 4
Triglycerides: 190 mg/dL — ABNORMAL HIGH (ref 0.0–149.0)
VLDL: 38 mg/dL (ref 0.0–40.0)

## 2022-09-07 LAB — HEMOGLOBIN A1C: Hgb A1c MFr Bld: 6.2 % (ref 4.6–6.5)

## 2022-09-07 LAB — TROPONIN I (HIGH SENSITIVITY): High Sens Troponin I: 3 ng/L (ref 2–17)

## 2022-09-07 LAB — TSH: TSH: 3.2 u[IU]/mL (ref 0.35–5.50)

## 2022-09-07 MED ORDER — ROSUVASTATIN CALCIUM 10 MG PO TABS
10.0000 mg | ORAL_TABLET | Freq: Every day | ORAL | 1 refills | Status: DC
Start: 2022-09-07 — End: 2022-12-04

## 2022-09-07 MED ORDER — VALSARTAN 80 MG PO TABS
80.0000 mg | ORAL_TABLET | Freq: Every day | ORAL | 1 refills | Status: DC
Start: 2022-09-07 — End: 2023-03-23

## 2022-09-07 NOTE — Progress Notes (Signed)
Subjective:  Patient ID: Ethan [name], male    DOB: September 26, 1950  Age: 72 y.o. MRN: [id number]  CC: Hypertension, Hyperlipidemia, and Annual Exam   HPI Ethan [name] presents for a CPX and f/up ---  Discussed the use of AI scribe software for clinical note transcription with the patient, who gave verbal consent to proceed.  History of Present Illness   The patient, diagnosed with prostate cancer, has been maintaining an active lifestyle, walking approximately five miles daily since the day of diagnosis 5 years ago. This routine has been consistent. The patient reports good recovery after exertion, such as climbing a hill, and denies experiencing any chest pain, shortness of breath, cold sweats, dizziness, lightheadedness, headaches, or blurred vision. There have been no instances of feeling on the verge of passing out. Additionally, the patient denies any swelling in the legs or feet.       Outpatient Medications Prior to Visit  Medication Sig Dispense Refill   abiraterone acetate (ZYTIGA) 250 MG tablet Take 1 tablet (250 mg total) by mouth daily. Take with a low fat meal 30 tablet 2   Calcium Carb-Cholecalciferol (CALTRATE 600+D3 PO) Take 2 tablets by mouth daily.     denosumab (XGEVA) 120 MG/1.7ML SOLN injection 1.7 ml     Multiple Vitamin (MULTIVITAMIN) tablet Take 1 tablet by mouth daily.     potassium chloride SA (KLOR-CON M20) 20 MEQ tablet Take 1 tablet (20 mEq total) by mouth daily. 90 tablet 1   predniSONE (DELTASONE) 5 MG tablet TAKE 1 TABLET BY MOUTH EVERY DAY WITH BREAKFAST 90 tablet 3   tamsulosin (FLOMAX) 0.4 MG CAPS capsule TAKE 1 CAPSULE BY MOUTH EVERY DAY AFTER SUPPER 90 capsule 1   zolpidem (AMBIEN) 5 MG tablet Take 1 tablet (5 mg total) by mouth at bedtime as needed for sleep. 90 tablet 0   hydrochlorothiazide (MICROZIDE) 12.5 MG capsule TAKE 1 CAPSULE BY MOUTH EVERY DAY 90 capsule 1   valsartan (DIOVAN) 80 MG tablet Take 1 tablet (80 mg total) by mouth daily. 30  tablet 0   Facility-Administered Medications Prior to Visit  Medication Dose Route Frequency Provider Last Rate Last Admin   betamethasone acetate-betamethasone sodium phosphate (CELESTONE) injection 6 mg  6 mg Other [place], DPM        ROS Review of Systems  Constitutional: Negative.  Negative for diaphoresis and fatigue.  HENT: Negative.    Eyes: Negative.   Respiratory: Negative.  Negative for cough, chest tightness, shortness of breath and wheezing.   Cardiovascular:  Negative for chest pain, palpitations and leg swelling.  Gastrointestinal:  Negative for abdominal pain, constipation, diarrhea, nausea and vomiting.  Endocrine: Negative.   Genitourinary: Negative.  Negative for difficulty urinating.  Musculoskeletal: Negative.  Negative for arthralgias and myalgias.  Skin: Negative.   Neurological:  Negative for dizziness, weakness and headaches.  Hematological:  Negative for adenopathy. Does not bruise/bleed easily.  Psychiatric/Behavioral: Negative.      Objective:  BP 126/86 (BP Location: Right Arm, Patient Position: Sitting, Cuff Size: Large)   Pulse 69   Temp 97.9 F (36.6 C) (Oral)   Resp 16   Ht 5\' 10"  (1.778 m)   Wt 186 lb (84.4 kg)   SpO2 96%   BMI 26.69 kg/m   BP Readings from Last 3 Encounters:  09/07/22 126/86  08/05/22 115/79  05/06/22 123/79    Wt Readings from Last 3 Encounters:  09/07/22 186 lb (84.4 kg)  08/05/22 181 lb 14.4 oz (  82.5 kg)  05/06/22 182 lb (82.6 kg)    Physical Exam Vitals reviewed.  Constitutional:      Appearance: Normal appearance.  HENT:     Nose: Nose normal.     Mouth/Throat:     Mouth: Mucous membranes are moist.  Eyes:     General: No scleral icterus.    Conjunctiva/sclera: Conjunctivae normal.  Cardiovascular:     Rate and Rhythm: Normal rate. Occasional Extrasystoles are present.    Pulses: Normal pulses.     Heart sounds: No murmur heard.    No friction rub. No gallop.     Comments: EKG- SR  with PSVC's, 85 bpm TWI in V1,V2 - new Flat T in III - new No LVH or Q waves Pulmonary:     Effort: Pulmonary effort is normal.     Breath sounds: No stridor. No wheezing, rhonchi or rales.  Abdominal:     General: Abdomen is flat.     Palpations: There is no mass.     Tenderness: There is no abdominal tenderness. There is no guarding.     Hernia: No hernia is present.  Musculoskeletal:        General: Normal range of motion.     Right lower leg: No edema.     Left lower leg: No edema.  Skin:    General: Skin is warm and dry.  Neurological:     General: No focal deficit present.     Mental Status: He is alert. Mental status is at baseline.  Psychiatric:        Mood and Affect: Mood normal.        Behavior: Behavior normal.     Lab Results  Component Value Date   WBC 7.1 08/02/2022   HGB 14.6 08/02/2022   HCT 41.7 08/02/2022   PLT 277 08/02/2022   GLUCOSE 101 (H) 09/07/2022   CHOL 160 09/07/2022   TRIG 190.0 (H) 09/07/2022   HDL 42.80 09/07/2022   LDLCALC 80 09/07/2022   ALT 18 08/02/2022   AST 19 08/02/2022   NA 139 09/07/2022   K 4.3 09/07/2022   CL 101 09/07/2022   CREATININE 0.91 09/07/2022   BUN 23 09/07/2022   CO2 27 09/07/2022   TSH 3.20 09/07/2022   PSA 3.9 08/02/2022   INR 1.0 05/12/2018   HGBA1C 6.2 09/07/2022    DG INJECT DIAG/THERA/INC NEEDLE/CATH/PLC EPI/LUMB/SAC W/IMG  Result Date: 03/01/2022 CLINICAL DATA:  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 Left-sided low back and leg pain. No improvement after L4-L5 interlaminar epidural injection 2 weeks ago. Previous excellent response to an L4-L5 injection in July. FLUOROSCOPY: Radiation Exposure Index (as provided by the fluoroscopic device): 0.9 mGy Kerma PROCEDURE: The procedure, risks, benefits, and alternatives were explained to the patient. Questions regarding the procedure were encouraged and answered. The patient understands and consents to the procedure. LUMBAR EPIDURAL  INJECTION: An interlaminar approach was performed on the left at L5-S1. The overlying skin was cleansed and anesthetized. A 3.5 inch 20 gauge epidural needle was advanced using loss-of-resistance technique. DIAGNOSTIC EPIDURAL INJECTION: Injection of Isovue-M 200 shows a good epidural pattern with spread above and below the level of needle placement, primarily on the left. No vascular opacification is seen. THERAPEUTIC EPIDURAL INJECTION: 80 mg of Depo-Medrol mixed with 3 mL of 1% lidocaine were instilled. The procedure was well-tolerated, and the patient was discharged thirty minutes following the injection in good condition. COMPLICATIONS: None immediate. IMPRESSION: Technically successful interlaminar epidural injection on the  left at L5-S1. Electronically Signed   By: Obie Dredge M.D.   On: 03/01/2022 13:25    Assessment & Plan:   Primary hypertension- His blood pressure is well-controlled. -     Basic metabolic panel; Future -     TSH; Future -     EKG 12-Lead -     Valsartan; Take 1 tablet (80 mg total) by mouth daily.  Dispense: 90 tablet; Refill: 1 -     CT CARDIAC SCORING ([facility] LOCATIONS ONLY); Future  Prostate cancer (HCC)- Recent PSA was down to 3.9.  Dyslipidemia, goal LDL below 100- I have asked him to start a statin for CV risk reduction.  Will evaluate for CAD with a CCS. -     Lipid panel; Future -     Lipoprotein A (LPA); Future -     TSH; Future -     Rosuvastatin Calcium; Take 1 tablet (10 mg total) by mouth daily.  Dispense: 90 tablet; Refill: 1 -     CT CARDIAC SCORING ([facility] LOCATIONS ONLY); Future  Chronic hyperglycemia- He is prediabetic. -     Basic metabolic panel; Future -     Hemoglobin A1c; Future  Screening for colon cancer -     Cologuard  Abnormal electrocardiogram (ECG) (EKG) -     Troponin I (High Sensitivity); Future -     CT CARDIAC SCORING ([facility] LOCATIONS ONLY); Future  Encounter for general adult medical examination with abnormal findings- Exam  completed, labs reviewed, vaccines reviewed and updated, cancer screenings addressed, patient education was given.  Need for vaccination for Strep pneumoniae -     Pneumococcal polysaccharide vaccine 23-valent greater than or equal to 2yo subcutaneous/IM     Follow-up: Return in about 6 months (around 03/09/2023).  Sanda Linger, MD

## 2022-09-11 LAB — LIPOPROTEIN A (LPA): Lipoprotein (a): <10 nmol/L (ref <75)

## 2022-09-18 DIAGNOSIS — Z1211 Encounter for screening for malignant neoplasm of colon: Secondary | ICD-10-CM | POA: Diagnosis not present

## 2022-09-20 ENCOUNTER — Other Ambulatory Visit: Payer: Self-pay

## 2022-09-22 ENCOUNTER — Other Ambulatory Visit (HOSPITAL_COMMUNITY): Payer: Self-pay

## 2022-09-22 ENCOUNTER — Telehealth: Payer: Self-pay | Admitting: Pharmacy Technician

## 2022-09-22 ENCOUNTER — Other Ambulatory Visit: Payer: Self-pay | Admitting: Hematology

## 2022-09-22 ENCOUNTER — Encounter: Payer: Self-pay | Admitting: Hematology

## 2022-09-22 LAB — COLOGUARD: Cologuard: NEGATIVE

## 2022-09-22 NOTE — Telephone Encounter (Signed)
Oral Oncology Patient Advocate Encounter   Was successful in securing patient a $5,500 grant from [employer] (PAF) to provide copayment coverage for abiraterone.  This will keep the out of pocket expense at $0.     I have spoken with the patient.    The billing information is as follows and has been shared with [facility].   RxBin: [id number] PCN:  [id number] Member ID: [id number] Group ID: [id number] Dates of Eligibility: 09/10/22 through 09/22/23  Jinger Neighbors, CPhT-Adv Oncology Pharmacy Patient Advocate [facility] Direct Number: [phone]  Fax: [phone]

## 2022-09-23 LAB — COLOGUARD: COLOGUARD: NEGATIVE

## 2022-09-28 ENCOUNTER — Other Ambulatory Visit (HOSPITAL_COMMUNITY): Payer: Self-pay

## 2022-09-29 ENCOUNTER — Other Ambulatory Visit (HOSPITAL_COMMUNITY): Payer: Self-pay

## 2022-09-29 ENCOUNTER — Other Ambulatory Visit: Payer: Self-pay

## 2022-10-05 ENCOUNTER — Encounter: Payer: Self-pay | Admitting: Hematology

## 2022-10-05 ENCOUNTER — Ambulatory Visit
Admission: RE | Admit: 2022-10-05 | Discharge: 2022-10-05 | Discharge status: Home / Self Care | Payer: No Typology Code available for payment source | Source: Ambulatory Visit | Attending: Internal Medicine | Admitting: Internal Medicine

## 2022-10-05 DIAGNOSIS — E785 Hyperlipidemia, unspecified: Secondary | ICD-10-CM

## 2022-10-05 DIAGNOSIS — I10 Essential (primary) hypertension: Secondary | ICD-10-CM

## 2022-10-05 DIAGNOSIS — R9431 Abnormal electrocardiogram [ECG] [EKG]: Secondary | ICD-10-CM

## 2022-10-06 ENCOUNTER — Encounter: Payer: Self-pay | Admitting: Internal Medicine

## 2022-10-06 ENCOUNTER — Other Ambulatory Visit: Payer: Self-pay | Admitting: Internal Medicine

## 2022-10-06 DIAGNOSIS — I7121 Aneurysm of the ascending aorta, without rupture: Secondary | ICD-10-CM | POA: Insufficient documentation

## 2022-10-06 DIAGNOSIS — R931 Abnormal findings on diagnostic imaging of heart and coronary circulation: Secondary | ICD-10-CM | POA: Insufficient documentation

## 2022-10-14 ENCOUNTER — Encounter: Payer: Self-pay | Admitting: Hematology

## 2022-10-14 ENCOUNTER — Other Ambulatory Visit (HOSPITAL_COMMUNITY): Payer: Self-pay

## 2022-10-14 ENCOUNTER — Other Ambulatory Visit: Payer: Self-pay

## 2022-10-18 NOTE — Progress Notes (Unsigned)
Cardiology Office Note:   Date:  10/20/2022  NAME:  Ethan [name]    MRN: [id number] DOB:  June 02, 1950   PCP:  Etta Grandchild, MD  Cardiologist:  Reatha Harps, MD  Electrophysiologist:  None   Referring MD: Etta Grandchild, MD   Chief Complaint  Patient presents with   New Patient (Initial Visit)    Patient was referred by PCP for Aneurysm of ascending aorta without rupture (HCC) Agatston CAC score 200-399 . Meds reviewed verbally with patient.      History of Present Illness:   Ethan [name] is a 72 y.o. male with a hx of CAD, aortic ectasia, HLD who is being seen today for the evaluation of CAD at the request of Etta Grandchild, MD. recently underwent coronary calcium score.  U2 84 which is 59th percentile.  Reports he is walking 5 miles per day.  No chest pains or trouble breathing.  EKG from primary care office is not ischemic with PACs.  No symptoms from this.  He has never had a heart attack or stroke.  Started on Crestor.  Also on aspirin.  Okay by me.  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  Likely normal for his age.  He has stage IV prostate cancer that is treated.  Seems to be doing well.  Blood pressure is well-controlled.  Exercising enough.  Watching his diet.  Denies any major cardiac symptoms in office.  Never smoker.  No alcohol or drug use.  Retired Corporate treasurer.  Worked actually at the [place].  No strong family history of heart disease.  Denies any symptoms of angina today.  Overall doing well with his diet and exercising appropriately.  Problem List CAD -CAC score 284 (59th percentile) 2. 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 HTN 4. HLD -T chol 160, HDL 42, LDL 80, TG 190 5. Stage IV prostate CA  Past Medical 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    Prostate cancer metastatic to multiple sites [facility]) urologist-  dr Annabell Howells   dx 08-25-2017--  Gleason  9, PSA 542,  vol 67cc--  METs to bone (multiple sites) and lymph nodes   Wears glasses     Past Surgical History: Past Surgical History:  Procedure Laterality Date   APPENDECTOMY  1963   COLONOSCOPY WITH PROPOFOL  2017   ORCHIECTOMY Bilateral 09/08/2017   Procedure: ORCHIECTOMY;  Surgeon: Bjorn Pippin, MD;  Location: [facility];  Service: Urology;  Laterality: Bilateral;   ORIF HUMERUS FRACTURE Left 05/12/2018   Procedure: OPEN REDUCTION INTERNAL FIXATION (ORIF) LEFT PROXIMAL HUMERUS FRACTURE;  Surgeon: Myrene Galas, MD;  Location: [facility] OR;  Service: Orthopedics;  Laterality: Left;    Current Medications: Current Meds  Medication Sig   abiraterone acetate (ZYTIGA) 250 MG tablet Take 1 tablet (250 mg total) by mouth daily. Take with a low fat meal   aspirin EC 81 MG tablet Take 81 mg by mouth daily. Swallow whole.   Calcium Carb-Cholecalciferol (CALTRATE 600+D3 PO) Take 2 tablets by mouth daily.   denosumab (XGEVA) 120 MG/1.7ML SOLN injection 1.7 ml   KLOR-CON M20 20 MEQ tablet TAKE 1 TABLET BY MOUTH EVERY DAY   Multiple Vitamin (MULTIVITAMIN) tablet Take 1 tablet by mouth daily.   predniSONE (DELTASONE) 5 MG tablet TAKE 1 TABLET BY MOUTH EVERY DAY WITH BREAKFAST   rosuvastatin (CRESTOR)  10 MG tablet Take 1 tablet (10 mg total) by mouth daily.   tamsulosin (FLOMAX) 0.4 MG CAPS capsule TAKE 1 CAPSULE BY MOUTH EVERY DAY AFTER SUPPER   valsartan (DIOVAN) 80 MG tablet Take 1 tablet (80 mg total) by mouth daily.   zolpidem (AMBIEN) 5 MG tablet Take 1 tablet (5 mg total) by mouth at bedtime as needed for sleep.   Current Facility-Administered Medications for the 10/20/22 encounter (Office Visit) with Sande Rives, MD  Medication   betamethasone acetate-betamethasone sodium phosphate (CELESTONE) injection 6 mg     Allergies:    Patient has no known allergies.   Social History: Social History   Socioeconomic History   Marital status: Married    Spouse name: Not  on file   Number of children: 2   Years of education: Not on file   Highest education level: Bachelor's degree (e.g., BA, AB, BS)  Occupational History   Occupation: Theme park manager [employer]  Tobacco Use   Smoking status: Former    Current packs/day: 0.00    Types: Cigarettes    Start date: 09/08/1986    Quit date: 09/08/2006    Years since quitting: 16.1   Smokeless tobacco: Never  Vaping Use   Vaping status: Never Used  Substance and Sexual Activity   Alcohol use: Not Currently    Comment: 1 drink per day   Drug use: Never   Sexual activity: Yes    Partners: Female  Other Topics Concern   Not on file  Social History Narrative   Not on file   Social Determinants of Health   Financial Resource Strain: Low Risk  (09/03/2022)   Overall Financial Resource Strain (CARDIA)    Difficulty of Paying Living Expenses: Not very hard  Food Insecurity: No Food Insecurity (09/03/2022)   Hunger Vital Sign    Worried About Running Out of Food in the Last Year: Never true    Ran Out of Food in the Last Year: Never true  Transportation Needs: No Transportation Needs (09/03/2022)   PRAPARE - Administrator, Civil Service (Medical): No    Lack of Transportation (Non-Medical): No  Physical Activity: Sufficiently Active (09/03/2022)   Exercise Vital Sign    Days of Exercise per Week: 7 days    Minutes of Exercise per Session: 120 min  Stress: No Stress Concern Present (09/03/2022)   [employer] of Occupational Health - Occupational Stress Questionnaire    Feeling of Stress : Not at all  Social Connections: Socially Integrated (09/03/2022)   Social Connection and Isolation Panel [NHANES]    Frequency of Communication with Friends and Family: More than three times a week    Frequency of Social Gatherings with Friends and Family: Three times a week    Attends Religious Services: More than 4 times per year    Active Member of Clubs or Organizations: Yes     Attends Banker Meetings: 1 to 4 times per year    Marital Status: Married     Family History: The patient's family history includes Alcoholism in his father; Congestive Heart Failure in his brother; Dementia in his mother; Heart attack in his paternal grandfather; Kidney failure in his father; Lung cancer in his maternal aunt, maternal grandmother, maternal uncle, and paternal uncle.  ROS:   All other ROS reviewed and negative. Pertinent positives noted in the HPI.     EKGs/Labs/Other Studies Reviewed:   The following studies were personally reviewed  by me today:  EKG:  EKG is not ordered today.   EKG dated 09/07/2022 shows normal sinus rhythm with PACs and nonspecific ST-T changes.      Recent Labs: 08/02/2022: ALT 18; Hemoglobin 14.6; Platelet Count 277 09/07/2022: BUN 23; Creatinine, Ser 0.91; Potassium 4.3; Sodium 139; TSH 3.20   Recent Lipid Panel    Component Value Date/Time   CHOL 160 09/07/2022 1058   TRIG 190.0 (H) 09/07/2022 1058   HDL 42.80 09/07/2022 1058   CHOLHDL 4 09/07/2022 1058   VLDL 38.0 09/07/2022 1058   LDLCALC 80 09/07/2022 1058    Physical Exam:   VS:  BP 116/72 (BP Location: Left Arm, Patient Position: Sitting, Cuff Size: Normal)   Pulse 88   Ht 5\' 10"  (1.778 m)   Wt 185 lb (83.9 kg)   SpO2 98%   BMI 26.54 kg/m    Wt Readings from Last 3 Encounters:  10/20/22 185 lb (83.9 kg)  09/07/22 186 lb (84.4 kg)  08/05/22 181 lb 14.4 oz (82.5 kg)    General: Well nourished, well developed, in no acute distress Head: Atraumatic, normal size  Eyes: PEERLA, EOMI  Neck: Supple, no JVD Endocrine: No thryomegaly Cardiac: Normal S1, S2; RRR; no murmurs, rubs, or gallops Lungs: Clear to auscultation bilaterally, no wheezing, rhonchi or rales  Abd: Soft, nontender, no hepatomegaly  Ext: No edema, pulses 2+ Musculoskeletal: No deformities, BUE and BLE strength normal and equal Skin: Warm and dry, no rashes   Neuro: Alert and oriented to person,  place, time, and situation, CNII-XII grossly intact, no focal deficits  Psych: Normal mood and affect   ASSESSMENT:   Obryan Rosberg is a 72 y.o. male who presents for the following: 1. Agatston CAC score 200-399   2. Mixed hyperlipidemia   3. Primary hypertension   4. PAC (premature atrial contraction)   5. Ascending aorta dilation (HCC)     PLAN:   1. Agatston CAC score 200-399 2. Mixed hyperlipidemia -Elevated coronary calcium score.  No symptoms of angina.  Started on aspirin and Crestor 10 mg daily.  LDL 80.  Goal is less than 70.  No symptoms of chest discomfort.  No need for stress test.  He is walking 5 miles per day.  On his diet.  Suspect he will get to go easily with Crestor.  Labs can be rechecked by his primary care physician.  No need for stress test.  Values are less than 400.  Low likelihood for obstructive disease in my opinion.  3. Primary hypertension -Well-controlled on current medication.  4. PAC (premature atrial contraction) -Insulin only noted.  No symptoms.  No need for treatment.  5. Ascending aorta dilation (HCC) -Aorta 40 mm.  Likely upper limits of normal for his age.  He will see me back in 1 year.  We will perform an echo before that appointment.  As long as this value stable he can likely forego further testing.      Disposition: Return in about 1 year (around 10/20/2023).  Medication Adjustments/Labs and Tests Ordered: Current medicines are reviewed at length with the patient today.  Concerns regarding medicines are outlined above.  Orders Placed This Encounter  Procedures   ECHOCARDIOGRAM COMPLETE   No orders of the defined types were placed in this encounter.  Patient Instructions  Medication Instructions:  No changes *If you need a refill on your cardiac medications before your next appointment, please call your pharmacy*   Lab Work: No labs If you  have labs (blood work) drawn today and your tests are completely normal, you will receive  your results only by: MyChart Message (if you have MyChart) OR A paper copy in the mail If you have any lab test that is abnormal or we need to change your treatment, we will call you to review the results.   Testing/Procedures:[address] NEEDED IN AUGUST 2025 Your physician has requested that you have an echocardiogram. Echocardiography is a painless test that uses sound waves to create images of your heart. It provides your doctor with information about the size and shape of your heart and how well your heart's chambers and valves are working. This procedure takes approximately one hour. There are no restrictions for this procedure. Please do NOT wear cologne, perfume, aftershave, or lotions (deodorant is allowed). Please arrive 15 minutes prior to your appointment time.    Follow-Up: At [facility], you and your health needs are our priority.  As part of our continuing mission to provide you with exceptional heart care, we have created designated Provider Care Teams.  These Care Teams include your primary Cardiologist (physician) and Advanced Practice Providers (APPs -  Physician Assistants and Nurse Practitioners) who all work together to provide you with the care you need, when you need it.  We recommend signing up for the patient portal called "MyChart".  Sign up information is provided on this After Visit Summary.  MyChart is used to connect with patients for Virtual Visits (Telemedicine).  Patients are able to view lab/test results, encounter notes, upcoming appointments, etc.  Non-urgent messages can be sent to your provider as well.   To learn more about what you can do with MyChart, go to [email or web address].    Your next appointment:   1 year(s)  Provider:   Reatha Harps, MD        Signed, Lenna Gilford. Flora Lipps, MD, [facility]  [facility]  [address] [place], [place] [address] [phone]  10/20/2022 10:55 AM

## 2022-10-20 ENCOUNTER — Ambulatory Visit: Payer: Medicare Other | Admitting: Cardiovascular Disease

## 2022-10-20 ENCOUNTER — Encounter: Payer: Self-pay | Admitting: Cardiovascular Disease

## 2022-10-20 VITALS — BP 116/72 | HR 88 | Ht 70.0 in | Wt 185.0 lb

## 2022-10-20 DIAGNOSIS — I10 Essential (primary) hypertension: Secondary | ICD-10-CM | POA: Diagnosis not present

## 2022-10-20 DIAGNOSIS — I491 Atrial premature depolarization: Secondary | ICD-10-CM

## 2022-10-20 DIAGNOSIS — E782 Mixed hyperlipidemia: Secondary | ICD-10-CM

## 2022-10-20 DIAGNOSIS — I7121 Aneurysm of the ascending aorta, without rupture: Secondary | ICD-10-CM

## 2022-10-20 DIAGNOSIS — R931 Abnormal findings on diagnostic imaging of heart and coronary circulation: Secondary | ICD-10-CM | POA: Diagnosis not present

## 2022-10-20 DIAGNOSIS — I77810 Thoracic aortic ectasia: Secondary | ICD-10-CM

## 2022-10-20 NOTE — Patient Instructions (Signed)
Medication Instructions:  No changes *If you need a refill on your cardiac medications before your next appointment, please call your pharmacy*   Lab Work: No labs If you have labs (blood work) drawn today and your tests are completely normal, you will receive your results only by: MyChart Message (if you have MyChart) OR A paper copy in the mail If you have any lab test that is abnormal or we need to change your treatment, we will call you to review the results.   Testing/Procedures:[address] NEEDED IN AUGUST 2025 Your physician has requested that you have an echocardiogram. Echocardiography is a painless test that uses sound waves to create images of your heart. It provides your doctor with information about the size and shape of your heart and how well your heart's chambers and valves are working. This procedure takes approximately one hour. There are no restrictions for this procedure. Please do NOT wear cologne, perfume, aftershave, or lotions (deodorant is allowed). Please arrive 15 minutes prior to your appointment time.    Follow-Up: At [facility], you and your health needs are our priority.  As part of our continuing mission to provide you with exceptional heart care, we have created designated Provider Care Teams.  These Care Teams include your primary Cardiologist (physician) and Advanced Practice Providers (APPs -  Physician Assistants and Nurse Practitioners) who all work together to provide you with the care you need, when you need it.  We recommend signing up for the patient portal called "MyChart".  Sign up information is provided on this After Visit Summary.  MyChart is used to connect with patients for Virtual Visits (Telemedicine).  Patients are able to view lab/test results, encounter notes, upcoming appointments, etc.  Non-urgent messages can be sent to your provider as well.   To learn more about what you can do with MyChart, go to  [email or web address].    Your next appointment:   1 year(s)  Provider:   Reatha Harps, MD

## 2022-10-29 ENCOUNTER — Other Ambulatory Visit (HOSPITAL_COMMUNITY): Payer: Self-pay

## 2022-10-29 ENCOUNTER — Other Ambulatory Visit: Payer: Self-pay

## 2022-11-01 ENCOUNTER — Other Ambulatory Visit: Payer: Self-pay

## 2022-11-01 ENCOUNTER — Other Ambulatory Visit (HOSPITAL_COMMUNITY): Payer: Self-pay

## 2022-11-01 ENCOUNTER — Inpatient Hospital Stay: Payer: Medicare Other | Attending: Hematology

## 2022-11-01 DIAGNOSIS — Z9079 Acquired absence of other genital organ(s): Secondary | ICD-10-CM | POA: Diagnosis not present

## 2022-11-01 DIAGNOSIS — Z7952 Long term (current) use of systemic steroids: Secondary | ICD-10-CM | POA: Insufficient documentation

## 2022-11-01 DIAGNOSIS — Z79899 Other long term (current) drug therapy: Secondary | ICD-10-CM | POA: Diagnosis not present

## 2022-11-01 DIAGNOSIS — C61 Malignant neoplasm of prostate: Secondary | ICD-10-CM | POA: Insufficient documentation

## 2022-11-01 DIAGNOSIS — C7951 Secondary malignant neoplasm of bone: Secondary | ICD-10-CM | POA: Insufficient documentation

## 2022-11-01 LAB — CBC WITH DIFFERENTIAL (CANCER CENTER ONLY)
Abs Immature Granulocytes: 0.01 10*3/uL (ref 0.00–0.07)
Basophils Absolute: 0 10*3/uL (ref 0.0–0.1)
Basophils Relative: 0 %
Eosinophils Absolute: 0.2 10*3/uL (ref 0.0–0.5)
Eosinophils Relative: 4 %
HCT: 39.4 % (ref 39.0–52.0)
Hemoglobin: 13.8 g/dL (ref 13.0–17.0)
Immature Granulocytes: 0 %
Lymphocytes Relative: 30 %
Lymphs Abs: 2 10*3/uL (ref 0.7–4.0)
MCH: 31.9 pg (ref 26.0–34.0)
MCHC: 35 g/dL (ref 30.0–36.0)
MCV: 91.2 fL (ref 80.0–100.0)
Monocytes Absolute: 0.6 10*3/uL (ref 0.1–1.0)
Monocytes Relative: 9 %
Neutro Abs: 3.9 10*3/uL (ref 1.7–7.7)
Neutrophils Relative %: 57 %
Platelet Count: 246 10*3/uL (ref 150–400)
RBC: 4.32 MIL/uL (ref 4.22–5.81)
RDW: 11.9 % (ref 11.5–15.5)
WBC Count: 6.8 10*3/uL (ref 4.0–10.5)
nRBC: 0 % (ref 0.0–0.2)

## 2022-11-01 LAB — CMP (CANCER CENTER ONLY)
ALT: 19 U/L (ref 0–44)
AST: 22 U/L (ref 15–41)
Albumin: 4.4 g/dL (ref 3.5–5.0)
Alkaline Phosphatase: 45 U/L (ref 38–126)
Anion gap: 10 (ref 5–15)
BUN: 16 mg/dL (ref 8–23)
CO2: 25 mmol/L (ref 22–32)
Calcium: 9.5 mg/dL (ref 8.9–10.3)
Chloride: 105 mmol/L (ref 98–111)
Creatinine: 0.9 mg/dL (ref 0.61–1.24)
GFR, Estimated: >60 mL/min (ref >60)
Glucose, Bld: 101 mg/dL — ABNORMAL HIGH (ref 70–99)
Potassium: 3.6 mmol/L (ref 3.5–5.1)
Sodium: 140 mmol/L (ref 135–145)
Total Bilirubin: 0.9 mg/dL (ref 0.3–1.2)
Total Protein: 6.8 g/dL (ref 6.5–8.1)

## 2022-11-02 LAB — PROSTATE-SPECIFIC AG, SERUM (LABCORP): Prostate Specific Ag, Serum: 3.9 ng/mL (ref 0.0–4.0)

## 2022-11-03 NOTE — Assessment & Plan Note (Signed)
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 -Status post orchiectomy in June 2019. -Currently on Zytiga 1000 mg daily with prednisone started in July 2019, Xgeva 120 mg every 8 weeks -last PSMA scan in December 2023 showed stable disease in prostate, he did activity for this and bone lesions. -His PSA has been overall stable around 4-6, recent PSA on 11/01/22 was 3.9 -His Zytiga has been dose reduced to 250 mg daily with no fat diet in the morning, his mild fatigue has improved. -He is clinically doing very well, will continue Zytiga, prednisone and Xgeva. -He has been on Xgeva for 5 years, I changed to every 6 month in Feb 2024 -Follow-up in 3 months

## 2022-11-03 NOTE — Assessment & Plan Note (Signed)
Negative genetic testing on the [facility] multii-cancer panel in 12/2017

## 2022-11-04 ENCOUNTER — Inpatient Hospital Stay: Payer: Medicare Other | Admitting: Hematology

## 2022-11-04 ENCOUNTER — Other Ambulatory Visit: Payer: Self-pay

## 2022-11-04 ENCOUNTER — Other Ambulatory Visit (HOSPITAL_COMMUNITY): Payer: Self-pay

## 2022-11-04 ENCOUNTER — Inpatient Hospital Stay: Payer: Medicare Other

## 2022-11-04 VITALS — BP 129/68 | HR 64 | Temp 98.0°F | Resp 16 | Ht 70.0 in | Wt 182.9 lb

## 2022-11-04 DIAGNOSIS — C61 Malignant neoplasm of prostate: Secondary | ICD-10-CM

## 2022-11-04 DIAGNOSIS — C7951 Secondary malignant neoplasm of bone: Secondary | ICD-10-CM | POA: Diagnosis not present

## 2022-11-04 DIAGNOSIS — Z1379 Encounter for other screening for genetic and chromosomal anomalies: Secondary | ICD-10-CM

## 2022-11-04 DIAGNOSIS — Z79899 Other long term (current) drug therapy: Secondary | ICD-10-CM | POA: Diagnosis not present

## 2022-11-04 DIAGNOSIS — Z9079 Acquired absence of other genital organ(s): Secondary | ICD-10-CM | POA: Diagnosis not present

## 2022-11-04 DIAGNOSIS — Z7952 Long term (current) use of systemic steroids: Secondary | ICD-10-CM | POA: Diagnosis not present

## 2022-11-04 MED ORDER — DENOSUMAB 120 MG/1.7ML ~~LOC~~ SOLN
120.0000 mg | Freq: Once | SUBCUTANEOUS | Status: AC
  Administered 2022-11-04: 120 mg via SUBCUTANEOUS
  Filled 2022-11-04: qty 1.7

## 2022-11-04 MED ORDER — ABIRATERONE ACETATE 250 MG PO TABS
250.0000 mg | ORAL_TABLET | Freq: Every day | ORAL | 2 refills | Status: DC
Start: 2022-11-04 — End: 2023-02-03
  Filled 2022-11-04 – 2022-11-19 (×2): qty 30, 30d supply, fill #0
  Filled 2022-12-21: qty 30, 30d supply, fill #1
  Filled 2023-01-17: qty 30, 30d supply, fill #2

## 2022-11-04 NOTE — Progress Notes (Signed)
[facility]   [phone] Fax:([phone]   Clinic Follow up Note   Patient Care Team: Etta Grandchild, MD as PCP - General (Internal Medicine) O'Neal, Ronnald Ramp, MD as PCP - Cardiology (Cardiology) Malachy Mood, MD as Attending Physician (Hematology and Oncology)  Date of Service:  11/04/2022  CHIEF COMPLAINT: f/u of Prostate Cancer   CURRENT THERAPY:  Zytiga 1000 mg daily with prednisone started in July 2019. Decrease to 250 mg daily since 02/2022 due to good response    Xgeva on 120 mg every 8 weeks since 10/2017, changed to every 6 months in Feb 2024    ASSESSMENT:  Ethan [name] is a 72 y.o. 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  Castration sensitive disease. -Status post orchiectomy in June 2019. -Currently on Zytiga 1000 mg daily with prednisone started in July 2019, Xgeva 120 mg every 8 weeks -last PSMA scan in December 2023 showed stable disease in prostate, he did activity for this and bone lesions. -His PSA has been overall stable around 4-6, recent PSA on 11/01/22 was 3.9 -His Zytiga has been dose reduced to 250 mg daily with no fat diet in the morning, his mild fatigue has improved. -He is clinically doing very well, will continue Zytiga, prednisone and Xgeva. -He has been on Xgeva for 5 years, I changed to every 6 month in Feb 2024 -Follow-up in 3 months  Genetic testing Negative genetic testing on the [facility] multii-cancer panel in 12/2017    PLAN: -Continue  Xgeva injection q6 months -I refill Zytiga -PSA -normal -lab reviewed -CMP-revewed -lab and f/u in 3 months   SUMMARY OF ONCOLOGIC HISTORY: Oncology History Overview Note  Negative genetic testing   Prostate cancer (HCC)  10/27/2017 Initial Diagnosis   Prostate cancer (HCC)   12/26/2017 Genetic Testing   Negative genetic testing on the multii-cancer panel.  The Multi-Gene Panel offered by [facility]  includes sequencing and/or deletion duplication testing of the following 84 genes: AIP, ALK, APC, ATM, AXIN2,BAP1,  BARD1, BLM, BMPR1A, BRCA1, BRCA2, BRIP1, CASR, CDC73, CDH1, CDK4, CDKN1B, CDKN1C, CDKN2A (p14ARF), CDKN2A (p16INK4a), CEBPA, CHEK2, CTNNA1, DICER1, DIS3L2, EGFR (c.2369C>T, p.Thr790Met variant only), EPCAM (Deletion/duplication testing only), FH, FLCN, GATA2, GPC3, GREM1 (Promoter region deletion/duplication testing only), HOXB13 (c.251G>A, p.Gly84Glu), HRAS, KIT, MAX, MEN1, MET, MITF (c.952G>A, p.Glu318Lys variant only), MLH1, MSH2, MSH3, MSH6, MUTYH, NBN, NF1, NF2, NTHL1, PALB2, PDGFRA, PHOX2B, PMS2, POLD1, POLE, POT1, PRKAR1A, PTCH1, PTEN, RAD50, RAD51C, RAD51D, RB1, RECQL4, RET, RUNX1, SDHAF2, SDHA (sequence changes only), SDHB, SDHC, SDHD, SMAD4, SMARCA4, SMARCB1, SMARCE1, STK11, SUFU, TERC, TERT, TMEM127, TP53, TSC1, TSC2, VHL, WRN and WT1.  The report date is December 26, 2017.      INTERVAL HISTORY:  Ethan [name] is here for a follow up of Prostate Cancer. He was last seen by me on 08/05/2022. He presents to the clinic alone. Pt reports of PCP sent him to have a CT scan of his heart. Pt state that he is still being active by walking daily. Pt overall is doing well.Pt state since being on a low dose of Zytiga he feels a difference and a lot less fatigue.    All other systems were reviewed with the patient and are negative.  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    Prostate cancer metastatic to multiple sites [facility]) urologist-  dr Annabell Howells  dx 08-25-2017--  Gleason 9, PSA 542,  vol 67cc--  METs to bone (multiple sites) and lymph nodes   Wears glasses     SURGICAL HISTORY: Past Surgical History:  Procedure Laterality Date   APPENDECTOMY  1963   COLONOSCOPY WITH PROPOFOL  2017   ORCHIECTOMY Bilateral 09/08/2017   Procedure: ORCHIECTOMY;  Surgeon: Bjorn Pippin, MD;  Location: [facility];  Service: Urology;  Laterality: Bilateral;   ORIF HUMERUS FRACTURE Left 05/12/2018   Procedure: OPEN REDUCTION INTERNAL FIXATION (ORIF) LEFT PROXIMAL HUMERUS FRACTURE;  Surgeon: Myrene Galas, MD;  Location: [facility] OR;  Service: Orthopedics;  Laterality: Left;    I have reviewed the social history and family history with the patient and they are unchanged from previous note.  ALLERGIES:  has No Known Allergies.  MEDICATIONS:  Current Outpatient Medications  Medication Sig Dispense Refill   abiraterone acetate (ZYTIGA) 250 MG tablet Take 1 tablet (250 mg total) by mouth daily. Take with a low fat meal 30 tablet 2   aspirin EC 81 MG tablet Take 81 mg by mouth daily. Swallow whole.     Calcium Carb-Cholecalciferol (CALTRATE 600+D3 PO) Take 2 tablets by mouth daily.     denosumab (XGEVA) 120 MG/1.7ML SOLN injection 1.7 ml     KLOR-CON M20 20 MEQ tablet TAKE 1 TABLET BY MOUTH EVERY DAY 90 tablet 1   Multiple Vitamin (MULTIVITAMIN) tablet Take 1 tablet by mouth daily.     predniSONE (DELTASONE) 5 MG tablet TAKE 1 TABLET BY MOUTH EVERY DAY WITH BREAKFAST 90 tablet 3   rosuvastatin (CRESTOR) 10 MG tablet Take 1 tablet (10 mg total) by mouth daily. 90 tablet 1   tamsulosin (FLOMAX) 0.4 MG CAPS capsule TAKE 1 CAPSULE BY MOUTH EVERY DAY AFTER SUPPER 90 capsule 1   valsartan (DIOVAN) 80 MG tablet Take 1 tablet (80 mg total) by mouth daily. 90 tablet 1   zolpidem (AMBIEN) 5 MG tablet Take 1 tablet (5 mg total) by mouth at bedtime as needed for sleep. 90 tablet 0   Current Facility-Administered Medications  Medication Dose Route Frequency Provider Last Rate Last Admin   betamethasone acetate-betamethasone sodium phosphate (CELESTONE) injection 6 mg  6 mg Other [place], DPM        PHYSICAL EXAMINATION: ECOG PERFORMANCE STATUS: 0 - Asymptomatic  Vitals:   11/04/22 0825  BP: 129/68  Pulse: 64  Resp: 16  Temp: 98 F (36.7 C)  SpO2: 99%   Wt Readings from Last 3  Encounters:  11/04/22 182 lb 14.4 oz (83 kg)  10/20/22 185 lb (83.9 kg)  09/07/22 186 lb (84.4 kg)     GENERAL:alert, no distress and comfortable SKIN: skin color normal, no rashes or significant lesions EYES: normal, Conjunctiva are pink and non-injected, sclera clear  NEURO: alert & oriented x 3 with fluent speech  LABORATORY DATA:  I have reviewed the data as listed    Latest Ref Rng & Units 11/01/2022    8:12 AM 08/02/2022    7:43 AM 05/03/2022    7:33 AM  CBC  WBC 4.0 - 10.5 K/uL 6.8  7.1  6.7   Hemoglobin 13.0 - 17.0 g/dL 36.6  44.0  34.7   Hematocrit 39.0 - 52.0 % 39.4  41.7  38.3   Platelets 150 - 400 K/uL 246  277  290         Latest Ref Rng & Units 11/01/2022    8:12 AM 09/07/2022   10:58 AM  08/02/2022    7:43 AM  CMP  Glucose 70 - 99 mg/dL 161  096  045   BUN 8 - 23 mg/dL 16  23  18    Creatinine 0.61 - 1.24 mg/dL 4.09  8.11  9.14   Sodium 135 - 145 mmol/L 140  139  138   Potassium 3.5 - 5.1 mmol/L 3.6  4.3  4.5   Chloride 98 - 111 mmol/L 105  101  104   CO2 22 - 32 mmol/L 25  27  27    Calcium 8.9 - 10.3 mg/dL 9.5  78.2  9.6   Total Protein 6.5 - 8.1 g/dL 6.8   6.8   Total Bilirubin 0.3 - 1.2 mg/dL 0.9   0.8   Alkaline Phos 38 - 126 U/L 45   52   AST 15 - 41 U/L 22   19   ALT 0 - 44 U/L 19   18       RADIOGRAPHIC STUDIES: I have personally reviewed the radiological images as listed and agreed with the findings in the report. No results found.    No orders of the defined types were placed in this encounter.  All questions were answered. The patient knows to call the clinic with any problems, questions or concerns. No barriers to learning was detected. The total time spent in the appointment was 25 minutes.     Malachy Mood, MD 11/04/2022   Carolin Coy, CMA, am acting as scribe for Malachy Mood, MD.   I have reviewed the above documentation for accuracy and completeness, and I agree with the above.

## 2022-11-05 ENCOUNTER — Telehealth: Payer: Self-pay | Admitting: Hematology

## 2022-11-10 ENCOUNTER — Ambulatory Visit (INDEPENDENT_AMBULATORY_CARE_PROVIDER_SITE_OTHER): Payer: Medicare Other

## 2022-11-10 VITALS — Ht 70.0 in | Wt 182.0 lb

## 2022-11-10 DIAGNOSIS — Z0000 Encounter for general adult medical examination without abnormal findings: Secondary | ICD-10-CM | POA: Diagnosis not present

## 2022-11-10 NOTE — Progress Notes (Signed)
Subjective:   Ethan [name] is a 72 y.o. male who presents for an Initial Medicare Annual Wellness Visit.  Visit Complete: Virtual  I connected with  Ethan [name] on 11/10/22 by a audio enabled telemedicine application and verified that I am speaking with the correct person using two identifiers.  Patient Location: Home  Provider Location: Home Office  I discussed the limitations of evaluation and management by telemedicine. The patient expressed understanding and agreed to proceed.  Patient Medicare AWV questionnaire was completed by the patient on 11/09/2022; I have confirmed that all information answered by patient is correct and no changes since this date.  Vital Signs: Because this visit was a virtual/telehealth visit, some criteria may be missing or patient reported. Any vitals not documented were not able to be obtained and vitals that have been documented are patient reported.    Review of Systems     Cardiac Risk Factors include: advanced age (>33men, >18 women);dyslipidemia;family history of premature cardiovascular disease;hypertension;male gender     Objective:    Today's Vitals   11/10/22 1110  Weight: 182 lb (82.6 kg)  Height: 5\' 10"  (1.778 m)  PainSc: 0-No pain   Body mass index is 26.11 kg/m.     11/10/2022   11:05 AM 08/28/2020    1:03 PM 10/04/2019    2:39 PM 08/02/2019   10:14 AM 05/31/2019   12:54 PM 03/29/2019    1:14 PM 05/12/2018    2:00 PM  Advanced Directives  Does Patient Have a Medical Advance Directive? Yes Yes Yes Yes Yes Yes Yes  Type of Estate agent of [place];Living will Healthcare Power of [place];Living will     Healthcare Power of [place];Living will  Does patient want to make changes to medical advance directive?   No - Patient declined No - Patient declined   No - Patient declined  Copy of Healthcare Power of Attorney in Chart? No - copy requested No - copy requested     No - copy requested    Current  Medications (verified) Outpatient Encounter Medications as of 11/10/2022  Medication Sig   abiraterone acetate (ZYTIGA) 250 MG tablet Take 1 tablet (250 mg total) by mouth daily. Take with a low fat meal   aspirin EC 81 MG tablet Take 81 mg by mouth daily. Swallow whole.   Calcium Carb-Cholecalciferol (CALTRATE 600+D3 PO) Take 2 tablets by mouth daily.   denosumab (XGEVA) 120 MG/1.7ML SOLN injection 1.7 ml   KLOR-CON M20 20 MEQ tablet TAKE 1 TABLET BY MOUTH EVERY DAY   Multiple Vitamin (MULTIVITAMIN) tablet Take 1 tablet by mouth daily.   predniSONE (DELTASONE) 5 MG tablet TAKE 1 TABLET BY MOUTH EVERY DAY WITH BREAKFAST   rosuvastatin (CRESTOR) 10 MG tablet Take 1 tablet (10 mg total) by mouth daily.   tamsulosin (FLOMAX) 0.4 MG CAPS capsule TAKE 1 CAPSULE BY MOUTH EVERY DAY AFTER SUPPER   valsartan (DIOVAN) 80 MG tablet Take 1 tablet (80 mg total) by mouth daily.   zolpidem (AMBIEN) 5 MG tablet Take 1 tablet (5 mg total) by mouth at bedtime as needed for sleep.   Facility-Administered Encounter Medications as of 11/10/2022  Medication   betamethasone acetate-betamethasone sodium phosphate (CELESTONE) injection 6 mg    Allergies (verified) Patient has no known allergies.   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    Prostate cancer metastatic to multiple sites [facility]) urologist-  dr  wrenn   dx 08-25-2017--  Gleason 9, PSA 542,  vol 67cc--  METs to bone (multiple sites) and lymph nodes   Wears glasses    Past Surgical History:  Procedure Laterality Date   APPENDECTOMY  1963   COLONOSCOPY WITH PROPOFOL  2017   ORCHIECTOMY Bilateral 09/08/2017   Procedure: ORCHIECTOMY;  Surgeon: Bjorn Pippin, MD;  Location: [facility];  Service: Urology;  Laterality: Bilateral;   ORIF HUMERUS FRACTURE Left 05/12/2018   Procedure: OPEN REDUCTION INTERNAL FIXATION (ORIF) LEFT PROXIMAL HUMERUS FRACTURE;  Surgeon:  Myrene Galas, MD;  Location: [facility] OR;  Service: Orthopedics;  Laterality: Left;   Family History  Problem Relation Age of Onset   Dementia Mother    Kidney failure Father    Alcoholism Father    Congestive Heart Failure Brother    Lung cancer Maternal Grandmother    Heart attack Paternal Grandfather    Lung cancer Maternal Aunt    Lung cancer Maternal Uncle    Lung cancer Paternal Uncle    Social History   Socioeconomic History   Marital status: Married    Spouse name: Not on file   Number of children: 2   Years of education: Not on file   Highest education level: Bachelor's degree (e.g., BA, AB, BS)  Occupational History   Occupation: Theme park manager [employer]  Tobacco Use   Smoking status: Former    Current packs/day: 0.00    Types: Cigarettes    Start date: 09/08/1986    Quit date: 09/08/2006    Years since quitting: 16.1   Smokeless tobacco: Never  Vaping Use   Vaping status: Never Used  Substance and Sexual Activity   Alcohol use: Not Currently    Comment: 1 drink per day   Drug use: Never   Sexual activity: Yes    Partners: Female  Other Topics Concern   Not on file  Social History Narrative   Not on file   Social Determinants of Health   Financial Resource Strain: Low Risk  (11/10/2022)   Overall Financial Resource Strain (CARDIA)    Difficulty of Paying Living Expenses: Not hard at all  Food Insecurity: No Food Insecurity (11/10/2022)   Hunger Vital Sign    Worried About Running Out of Food in the Last Year: Never true    Ran Out of Food in the Last Year: Never true  Transportation Needs: No Transportation Needs (11/10/2022)   PRAPARE - Administrator, Civil Service (Medical): No    Lack of Transportation (Non-Medical): No  Physical Activity: Sufficiently Active (11/10/2022)   Exercise Vital Sign    Days of Exercise per Week: 7 days    Minutes of Exercise per Session: 150+ min  Stress: No Stress Concern Present  (11/10/2022)   [employer] of Occupational Health - Occupational Stress Questionnaire    Feeling of Stress : Not at all  Social Connections: Socially Integrated (11/10/2022)   Social Connection and Isolation Panel [NHANES]    Frequency of Communication with Friends and Family: More than three times a week    Frequency of Social Gatherings with Friends and Family: Three times a week    Attends Religious Services: More than 4 times per year    Active Member of Clubs or Organizations: Yes    Attends Banker Meetings: More than 4 times per year    Marital Status: Married    Tobacco Counseling Counseling given: Not Answered   Clinical  Intake:  Pre-visit preparation completed: Yes  Pain : No/denies pain Pain Score: 0-No pain     BMI - recorded: 26.11 Nutritional Status: BMI 25 -29 Overweight Nutritional Risks: None Diabetes: No  How often do you need to have someone help you when you read instructions, pamphlets, or other written materials from your doctor or pharmacy?: 1 - Never What is the last grade level you completed in school?: HSG  Interpreter Needed?: No  Information entered by :: Mitcheal Sweetin N. Kilea Mccarey, LPN.   Activities of Daily Living    11/10/2022   11:14 AM 11/09/2022    9:45 AM  In your present state of health, do you have any difficulty performing the following activities:  Hearing? 0 0  Vision? 0 0  Difficulty concentrating or making decisions? 0 0  Walking or climbing stairs? 0 0  Dressing or bathing? 0 0  Doing errands, shopping? 0 0  Preparing Food and eating ? N N  Using the Toilet? N N  In the past six months, have you accidently leaked urine? N N  Do you have problems with loss of bowel control? N N  Managing your Medications? N N  Managing your Finances? N N  Housekeeping or managing your Housekeeping? N N    Patient Care Team: Etta Grandchild, MD as PCP - General (Internal Medicine) O'Neal, Ronnald Ramp, MD as PCP -  Cardiology (Cardiology) Malachy Mood, MD as Attending Physician (Hematology and Oncology)  Indicate any recent Medical Services you may have received from other than [facility] providers in the past year (date may be approximate).     Assessment:   This is a routine wellness examination for Ethan [name].  Hearing/Vision screen Hearing Screening - Comments:: Denies hearing difficulties   Vision Screening - Comments:: Wears rx glasses - up to date with routine eye exams with Ritesh Poudyal, OD.   Dietary issues and exercise activities discussed:     Goals Addressed             This Visit's Progress    I am very well and blessed.  By next year this time I would like to have 30 million steps.        Depression Screen    11/10/2022   11:06 AM 09/07/2022   10:00 AM 09/18/2020    1:24 PM  PHQ 2/9 Scores  PHQ - 2 Score 0 0 0  PHQ- 9 Score 0 0     Fall Risk    11/10/2022   11:13 AM 11/09/2022    9:45 AM  Fall Risk   Falls in the past year? 0 0  Number falls in past yr: 0 0  Injury with Fall? 0   Risk for fall due to : No Fall Risks   Follow up Falls prevention discussed     MEDICARE RISK AT HOME: Medicare Risk at Home Any stairs in or around the home?: Yes If so, are there any without handrails?: Yes Home free of loose throw rugs in walkways, pet beds, electrical cords, etc?: Yes Adequate lighting in your home to reduce risk of falls?: Yes Life alert?: No Use of a cane, walker or w/c?: No Grab bars in the bathroom?: No Shower chair or bench in shower?: No Elevated toilet seat or a handicapped toilet?: No  TIMED UP AND GO:  Was the test performed? No    Cognitive Function:        11/10/2022   11:14 AM  6CIT Screen  What Year?  0 points  What month? 0 points  What time? 0 points  Count back from 20 0 points  Months in reverse 0 points  Repeat phrase 0 points  Total Score 0 points    Immunizations Immunization History  Administered Date(s) Administered   COVID-19,  mRNA, vaccine(Comirnaty)12 years and older 02/09/2022   Fluad Quad(high Dose 65+) 02/09/2022   Influenza Split 12/20/2011, 12/06/2018   Influenza, High Dose Seasonal PF 12/26/2017, 12/06/2018   Influenza,inj,Quad PF,6+ Mos 12/23/2016   Influenza-Unspecified 02/09/2022   PFIZER(Purple Top)SARS-COV-2 Vaccination 04/05/2019, 04/24/2019, 11/09/2019, 12/04/2020   Pfizer Covid-19 Vaccine Bivalent Booster 3yrs & up 12/04/2020   Pneumococcal Conjugate-13 12/26/2017   Tdap 10/21/2011   Zoster Recombinant(Shingrix) 12/23/2016   Zoster, Live 10/21/2011, 08/25/2016, 12/23/2016    TDAP status: Due, Education has been provided regarding the importance of this vaccine. Advised may receive this vaccine at local pharmacy or Health Dept. Aware to provide a copy of the vaccination record if obtained from local pharmacy or Health Dept. Verbalized acceptance and understanding.  Flu Vaccine status: Up to date  Pneumococcal vaccine status: Due, Education has been provided regarding the importance of this vaccine. Advised may receive this vaccine at local pharmacy or Health Dept. Aware to provide a copy of the vaccination record if obtained from local pharmacy or Health Dept. Verbalized acceptance and understanding.  Covid-19 vaccine status: Completed vaccines  Qualifies for Shingles Vaccine? Yes   Zostavax completed Yes   Shingrix Completed?: No.    Education has been provided regarding the importance of this vaccine. Patient has been advised to call insurance company to determine out of pocket expense if they have not yet received this vaccine. Advised may also receive vaccine at local pharmacy or Health Dept. Verbalized acceptance and understanding.  Screening Tests Health Maintenance  Topic Date Due   Hepatitis C Screening  Never done   Zoster Vaccines- Shingrix (2 of 2) 02/17/2017   Pneumonia Vaccine 26+ Years old (2 of 2 - PPSV23 or PCV20) 12/27/2018   DTaP/Tdap/Td (2 - Td or Tdap) 10/20/2021    COVID-19 Vaccine (6 - 2023-24 season) 04/06/2022   INFLUENZA VACCINE  10/14/2022   Medicare Annual Wellness (AWV)  11/10/2023   Fecal DNA (Cologuard)  09/21/2025   HPV VACCINES  Aged Out   Colonoscopy  Discontinued    Health Maintenance  Health Maintenance Due  Topic Date Due   Hepatitis C Screening  Never done   Zoster Vaccines- Shingrix (2 of 2) 02/17/2017   Pneumonia Vaccine 26+ Years old (2 of 2 - PPSV23 or PCV20) 12/27/2018   DTaP/Tdap/Td (2 - Td or Tdap) 10/20/2021   COVID-19 Vaccine (6 - 2023-24 season) 04/06/2022   INFLUENZA VACCINE  10/14/2022    Colorectal cancer screening: Type of screening: Cologuard. Completed 09/23/2022. Repeat every 3 years  Lung Cancer Screening: (Low Dose CT Chest recommended if Age 30-80 years, 20 pack-year currently smoking OR have quit w/in 15years.) does not qualify.   Lung Cancer Screening Referral: no  Additional Screening:  Hepatitis C Screening: does qualify; Completed: no  Vision Screening: Recommended annual ophthalmology exams for early detection of glaucoma and other disorders of the eye. Is the patient up to date with their annual eye exam?  Yes  Who is the provider or what is the name of the office in which the patient attends annual eye exams? Norva Pavlov, MD. If pt is not established with a provider, would they like to be referred to a provider to establish care? No .  Dental Screening: Recommended annual dental exams for proper oral hygiene  Diabetic Foot Exam: N/A  Community Resource Referral / Chronic Care Management: CRR required this visit?  No   CCM required this visit?  No    Plan:     I have personally reviewed and noted the following in the patient's chart:   Medical and social history Use of alcohol, tobacco or illicit drugs  Current medications and supplements including opioid prescriptions. Patient is not currently taking opioid prescriptions. Functional ability and status Nutritional  status Physical activity Advanced directives List of other physicians Hospitalizations, surgeries, and ER visits in previous 12 months Vitals Screenings to include cognitive, depression, and falls Referrals and appointments  In addition, I have reviewed and discussed with patient certain preventive protocols, quality metrics, and best practice recommendations. A written personalized care plan for preventive services as well as general preventive health recommendations were provided to patient.     Mickeal Needy, LPN   1/61/0960   After Visit Summary: (Mail) Due to this being a telephonic visit, the after visit summary with patients personalized plan was offered to patient via mail   Nurse Notes: Normal cognitive status assessed by direct observation via telephone conversation by this Nurse Health Advisor. No abnormalities found.

## 2022-11-10 NOTE — Patient Instructions (Signed)
Ethan [name] , Thank you for taking time to come for your Medicare Wellness Visit. I appreciate your ongoing commitment to your health goals. Please review the following plan we discussed and let me know if I can assist you in the future.   Referrals/Orders/Follow-Ups/Clinician Recommendations: NO  This is a list of the screening recommended for you and due dates:  Health Maintenance  Topic Date Due   Hepatitis C Screening  Never done   Zoster (Shingles) Vaccine (2 of 2) 02/17/2017   Pneumonia Vaccine (2 of 2 - PPSV23 or PCV20) 12/27/2018   DTaP/Tdap/Td vaccine (2 - Td or Tdap) 10/20/2021   COVID-19 Vaccine (6 - 2023-24 season) 04/06/2022   Flu Shot  10/14/2022   Medicare Annual Wellness Visit  11/10/2023   Cologuard (Stool DNA test)  09/21/2025   HPV Vaccine  Aged Out   Colon Cancer Screening  Discontinued    Advanced directives: (In Chart) A copy of your advanced directives are scanned into your chart should your provider ever need it.  Next Medicare Annual Wellness Visit scheduled for next year: Yes   Preventive Care attachment

## 2022-11-19 ENCOUNTER — Encounter: Payer: Self-pay | Admitting: Hematology

## 2022-11-19 ENCOUNTER — Other Ambulatory Visit (HOSPITAL_COMMUNITY): Payer: Self-pay

## 2022-11-19 ENCOUNTER — Other Ambulatory Visit: Payer: Self-pay

## 2022-11-24 ENCOUNTER — Other Ambulatory Visit: Payer: Self-pay

## 2022-12-04 ENCOUNTER — Other Ambulatory Visit: Payer: Self-pay | Admitting: Internal Medicine

## 2022-12-04 DIAGNOSIS — E785 Hyperlipidemia, unspecified: Secondary | ICD-10-CM

## 2022-12-21 ENCOUNTER — Other Ambulatory Visit: Payer: Self-pay

## 2022-12-21 NOTE — Progress Notes (Signed)
Specialty Pharmacy Refill Coordination Note  Ethan [name] is a 72 y.o. male contacted today regarding refills of specialty medication(s) Abiraterone Acetate   Patient requested Delivery   Delivery date: 12/27/22   Verified address: [address] [place], [place], [address]   Medication will be filled on 12/24/2022.

## 2023-01-03 ENCOUNTER — Telehealth: Payer: Self-pay | Admitting: Pharmacy Technician

## 2023-01-03 ENCOUNTER — Encounter: Payer: Self-pay | Admitting: Hematology

## 2023-01-03 ENCOUNTER — Other Ambulatory Visit (HOSPITAL_COMMUNITY): Payer: Self-pay

## 2023-01-03 NOTE — Telephone Encounter (Signed)
Oral Oncology Patient Advocate Encounter  Was successful in securing patient a $8,000 grant from [facility] to provide copayment coverage for abiraterone.  This will keep the out of pocket expense at $0.     [employer] ID: [id number]  I have spoken with the patient.   The billing information is as follows and has been shared with [facility].    RxBin: [id number] PCN: [id number] Member ID: [id number] Group ID: [id number] Dates of Eligibility: 12/04/22 through 12/03/23  Fund:  Prostate  Jinger Neighbors, CPhT-Adv Oncology Pharmacy Patient Advocate [facility] Direct Number: [phone]  Fax: [phone]

## 2023-01-16 ENCOUNTER — Encounter: Payer: Self-pay | Admitting: Hematology

## 2023-01-17 ENCOUNTER — Other Ambulatory Visit: Payer: Self-pay

## 2023-01-17 ENCOUNTER — Encounter: Payer: Self-pay | Admitting: Cardiovascular Disease

## 2023-01-17 DIAGNOSIS — E782 Mixed hyperlipidemia: Secondary | ICD-10-CM

## 2023-01-17 DIAGNOSIS — R931 Abnormal findings on diagnostic imaging of heart and coronary circulation: Secondary | ICD-10-CM

## 2023-01-17 DIAGNOSIS — C61 Malignant neoplasm of prostate: Secondary | ICD-10-CM

## 2023-01-17 NOTE — Progress Notes (Signed)
Specialty Pharmacy Refill Coordination Note  Mann Skaggs is a 72 y.o. male contacted today regarding refills of specialty medication(s) Abiraterone Acetate   Patient requested Delivery   Delivery date: 01/24/23   Verified address: [address] [place], [place], [address]   Medication will be filled on 01/21/23.

## 2023-01-20 ENCOUNTER — Other Ambulatory Visit (HOSPITAL_COMMUNITY): Payer: Self-pay

## 2023-01-31 ENCOUNTER — Inpatient Hospital Stay: Payer: Medicare Other | Attending: Hematology

## 2023-01-31 DIAGNOSIS — Z7952 Long term (current) use of systemic steroids: Secondary | ICD-10-CM | POA: Diagnosis not present

## 2023-01-31 DIAGNOSIS — Z9079 Acquired absence of other genital organ(s): Secondary | ICD-10-CM | POA: Diagnosis not present

## 2023-01-31 DIAGNOSIS — C61 Malignant neoplasm of prostate: Secondary | ICD-10-CM | POA: Insufficient documentation

## 2023-01-31 DIAGNOSIS — N400 Enlarged prostate without lower urinary tract symptoms: Secondary | ICD-10-CM | POA: Insufficient documentation

## 2023-01-31 DIAGNOSIS — G4700 Insomnia, unspecified: Secondary | ICD-10-CM | POA: Diagnosis not present

## 2023-01-31 DIAGNOSIS — C7951 Secondary malignant neoplasm of bone: Secondary | ICD-10-CM | POA: Insufficient documentation

## 2023-01-31 DIAGNOSIS — Z79899 Other long term (current) drug therapy: Secondary | ICD-10-CM | POA: Diagnosis not present

## 2023-01-31 DIAGNOSIS — I10 Essential (primary) hypertension: Secondary | ICD-10-CM | POA: Insufficient documentation

## 2023-01-31 LAB — CMP (CANCER CENTER ONLY)
ALT: 15 U/L (ref 0–44)
AST: 18 U/L (ref 15–41)
Albumin: 4.4 g/dL (ref 3.5–5.0)
Alkaline Phosphatase: 46 U/L (ref 38–126)
Anion gap: 7 (ref 5–15)
BUN: 18 mg/dL (ref 8–23)
CO2: 29 mmol/L (ref 22–32)
Calcium: 9.9 mg/dL (ref 8.9–10.3)
Chloride: 103 mmol/L (ref 98–111)
Creatinine: 0.97 mg/dL (ref 0.61–1.24)
GFR, Estimated: >60 mL/min (ref >60)
Glucose, Bld: 115 mg/dL — ABNORMAL HIGH (ref 70–99)
Potassium: 4 mmol/L (ref 3.5–5.1)
Sodium: 139 mmol/L (ref 135–145)
Total Bilirubin: 0.8 mg/dL (ref <1.2)
Total Protein: 6.8 g/dL (ref 6.5–8.1)

## 2023-01-31 LAB — CBC WITH DIFFERENTIAL (CANCER CENTER ONLY)
Abs Immature Granulocytes: 0.04 10*3/uL (ref 0.00–0.07)
Basophils Absolute: 0 10*3/uL (ref 0.0–0.1)
Basophils Relative: 1 %
Eosinophils Absolute: 0.4 10*3/uL (ref 0.0–0.5)
Eosinophils Relative: 6 %
HCT: 41.3 % (ref 39.0–52.0)
Hemoglobin: 14.2 g/dL (ref 13.0–17.0)
Immature Granulocytes: 1 %
Lymphocytes Relative: 28 %
Lymphs Abs: 1.8 10*3/uL (ref 0.7–4.0)
MCH: 31.5 pg (ref 26.0–34.0)
MCHC: 34.4 g/dL (ref 30.0–36.0)
MCV: 91.6 fL (ref 80.0–100.0)
Monocytes Absolute: 0.6 10*3/uL (ref 0.1–1.0)
Monocytes Relative: 9 %
Neutro Abs: 3.7 10*3/uL (ref 1.7–7.7)
Neutrophils Relative %: 55 %
Platelet Count: 276 10*3/uL (ref 150–400)
RBC: 4.51 MIL/uL (ref 4.22–5.81)
RDW: 11.7 % (ref 11.5–15.5)
WBC Count: 6.6 10*3/uL (ref 4.0–10.5)
nRBC: 0 % (ref 0.0–0.2)

## 2023-01-31 LAB — LIPID PANEL
Cholesterol: 110 mg/dL (ref 0–200)
HDL: 50 mg/dL (ref >40)
LDL Cholesterol: 48 mg/dL (ref 0–99)
Total CHOL/HDL Ratio: 2.2 {ratio}
Triglycerides: 61 mg/dL (ref <150)
VLDL: 12 mg/dL (ref 0–40)

## 2023-02-01 LAB — PROSTATE-SPECIFIC AG, SERUM (LABCORP): Prostate Specific Ag, Serum: 4.1 ng/mL — ABNORMAL HIGH (ref 0.0–4.0)

## 2023-02-02 NOTE — Assessment & Plan Note (Signed)
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 -Status post orchiectomy in June 2019. -Currently on Zytiga 1000 mg daily with prednisone started in July 2019, Xgeva 120 mg every 8 weeks -last PSMA scan in December 2023 showed stable disease in prostate, he did activity for this and bone lesions. -His PSA has been overall stable around 4-6, recent PSA on 11/01/22 was 3.9 -His Zytiga has been dose reduced to 250 mg daily with no fat diet in the morning, his mild fatigue has improved. -He is clinically doing very well, will continue Zytiga, prednisone and Xgeva. -He has been on Xgeva for 5 years, I changed to every 6 month in Feb 2024 -Follow-up in 3 months

## 2023-02-03 ENCOUNTER — Other Ambulatory Visit: Payer: Self-pay

## 2023-02-03 ENCOUNTER — Encounter: Payer: Self-pay | Admitting: Hematology

## 2023-02-03 ENCOUNTER — Inpatient Hospital Stay: Payer: Medicare Other | Admitting: Hematology

## 2023-02-03 ENCOUNTER — Other Ambulatory Visit (HOSPITAL_COMMUNITY): Payer: Self-pay

## 2023-02-03 VITALS — BP 105/72 | HR 80 | Temp 97.4°F | Ht 70.0 in | Wt 185.0 lb

## 2023-02-03 DIAGNOSIS — Z79899 Other long term (current) drug therapy: Secondary | ICD-10-CM | POA: Diagnosis not present

## 2023-02-03 DIAGNOSIS — I10 Essential (primary) hypertension: Secondary | ICD-10-CM | POA: Diagnosis not present

## 2023-02-03 DIAGNOSIS — G4700 Insomnia, unspecified: Secondary | ICD-10-CM | POA: Diagnosis not present

## 2023-02-03 DIAGNOSIS — Z7952 Long term (current) use of systemic steroids: Secondary | ICD-10-CM | POA: Diagnosis not present

## 2023-02-03 DIAGNOSIS — N400 Enlarged prostate without lower urinary tract symptoms: Secondary | ICD-10-CM | POA: Diagnosis not present

## 2023-02-03 DIAGNOSIS — Z9079 Acquired absence of other genital organ(s): Secondary | ICD-10-CM | POA: Diagnosis not present

## 2023-02-03 DIAGNOSIS — C61 Malignant neoplasm of prostate: Secondary | ICD-10-CM

## 2023-02-03 DIAGNOSIS — C7951 Secondary malignant neoplasm of bone: Secondary | ICD-10-CM | POA: Diagnosis not present

## 2023-02-03 MED ORDER — ABIRATERONE ACETATE 250 MG PO TABS
250.0000 mg | ORAL_TABLET | Freq: Every day | ORAL | 2 refills | Status: DC
Start: 2023-02-03 — End: 2023-05-10
  Filled 2023-02-03 – 2023-02-17 (×2): qty 30, 30d supply, fill #0
  Filled 2023-03-17: qty 30, 30d supply, fill #1
  Filled 2023-04-13: qty 30, 30d supply, fill #2

## 2023-02-03 MED ORDER — TAMSULOSIN HCL 0.4 MG PO CAPS: ORAL_CAPSULE | ORAL | 1 refills | Status: DC

## 2023-02-03 MED ORDER — ZOLPIDEM TARTRATE 5 MG PO TABS: 5.0000 mg | ORAL_TABLET | Freq: Every evening | ORAL | 0 refills | Status: DC | PRN

## 2023-02-03 NOTE — Progress Notes (Signed)
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  Castration sensitive disease. -Status post orchiectomy in June 2019. -Currently on Zytiga 1000 mg daily with prednisone started in July 2019, Xgeva 120 mg every 8 weeks -last PSMA scan in December 2023 showed stable disease in prostate, he did activity for this and bone lesions. -His PSA has been overall stable around 4-6, recent PSA on 11/01/22 was 3.9 -His Zytiga has been dose reduced to 250 mg daily with no fat diet in the morning, his mild fatigue has improved. -He is clinically doing very well, will continue Zytiga, prednisone and Xgeva. -He has been on Xgeva for 5 years, I changed to every 6 month in Feb 2024 -Follow-up in 3 months     Assessment and Plan    Metastatic Prostate Cancer 72 year old with metastatic prostate cancer, well-managed on abiraterone (Zytiga) and prednisone since 2019. PSA stable at 4.1, up from 3.9 but not concerning. No new symptoms or side effects. Labs (lipid panel, CBC, kidney, liver function) normal. Discussed switching from Xgeva to Zometa due to high copay; Zometa is effective and less expensive but may cause dental issues. Patient to verify insurance coverage. - Continue abiraterone (Zytiga) and prednisone - [place] to [employer] pending insurance approval, due to high co-pay - Schedule Zometa infusion for next visit -  Monitor PSA levels every three months - Follow-up in three months  Hypertension Aware that abiraterone and prednisone can elevate blood pressure. No significant issues reported. - Monitor blood pressure regularly  Benign Prostatic Hyperplasia (BPH) On tamsulosin for BPH, needs refill. - Refill tamsulosin prescription  Insomnia Uses Ambien (zolpidem) 3-4 times weekly for sleep, requests refill. - Refill Ambien prescription for 90 tablets, [facility]  General Health Maintenance Retired, enjoys walking, good family support. Regular follow-ups with primary care and cardiologist. Recent cardiac evaluation normal, no aneurysm. - Continue regular follow-ups with primary care and cardiologist - Maintain current exercise regimen - Ensure regular dental check-ups to prevent complications from bone-modifying agents  Follow-up -Continue abiraterone and prednisone -refilled meds for him  - schedule next oncology appointment with Dr. Cherly Hensen  - Lab work on Monday at 8 AM, follow-up with Dr. Cherly Hensen on Thursday at 8 AM per his request  -plan to start Zometa infusion during the next visit.         SUMMARY OF ONCOLOGIC HISTORY: Oncology History Overview Note  Negative genetic testing   Prostate cancer (HCC)  10/27/2017 Initial Diagnosis   Prostate cancer (HCC)   12/26/2017 Genetic Testing   Negative genetic testing on the multii-cancer panel.  The Multi-Gene Panel offered by [facility] includes sequencing and/or deletion duplication testing of the following 84 genes: AIP, ALK, APC, ATM, AXIN2,BAP1,  BARD1, BLM, BMPR1A, BRCA1, BRCA2, BRIP1, CASR, CDC73, CDH1, CDK4, CDKN1B, CDKN1C, CDKN2A (p14ARF), CDKN2A (p16INK4a), CEBPA, CHEK2, CTNNA1, DICER1, DIS3L2, EGFR (c.2369C>T, p.Thr790Met variant only), EPCAM (Deletion/duplication  testing only), FH, FLCN, GATA2, GPC3, GREM1 (Promoter region deletion/duplication testing only), HOXB13 (c.251G>A, p.Gly84Glu), HRAS, KIT, MAX, MEN1, MET, MITF  (c.952G>A, p.Glu318Lys variant only), MLH1, MSH2, MSH3, MSH6, MUTYH, NBN, NF1, NF2, NTHL1, PALB2, PDGFRA, PHOX2B, PMS2, POLD1, POLE, POT1, PRKAR1A, PTCH1, PTEN, RAD50, RAD51C, RAD51D, RB1, RECQL4, RET, RUNX1, SDHAF2, SDHA (sequence changes only), SDHB, SDHC, SDHD, SMAD4, SMARCA4, SMARCB1, SMARCE1, STK11, SUFU, TERC, TERT, TMEM127, TP53, TSC1, TSC2, VHL, WRN and WT1.  The report date is December 26, 2017.      Discussed the use of AI scribe software for clinical note transcription with the patient, who gave verbal consent to proceed.  History of Present Illness   The patient, a 72 year old male with a history of metastatic prostate cancer, presents for a routine follow-up. He reports feeling well overall and has increased his physical activity, aiming to walk seven miles a day. He denies any new symptoms or problems since his last visit.  The patient is currently on Zytiga (abiraterone) and prednisone. He reports no side effects from the [facility] but expresses concern that the abiraterone or prednisone may be causing weight gain and increased blood pressure. He also mentions a good appetite, which he understands to be a positive sign.  The patient's PSA levels have been stable, with the most recent reading at 4.1, a slight increase from the previous 3.9. He has been on the current regimen of Zytiga since 2019 and is on a low dose. He also receives Xgeva (denosumab) injections for bone health every six months but expresses concern about the high out-of-pocket cost.  In addition to his cancer treatment, the patient also sees a cardiologist. He was initially told he had an aneurysm that could rupture at any moment, but the cardiologist reassured him that he does not have an aneurysm. The cardiologist prescribed a statin and advised the patient to return in a year.  The patient also takes tamsulosin and requests a refill. He also requests a refill for his sleep aid, Ambien.         All other systems  were reviewed with the patient and are negative.  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    Prostate cancer metastatic to multiple sites [facility]) urologist-  dr Annabell Howells   dx 08-25-2017--  Gleason 9, PSA 542,  vol 67cc--  METs to bone (multiple sites) and lymph nodes   Wears glasses     SURGICAL HISTORY: Past Surgical History:  Procedure Laterality Date   APPENDECTOMY  1963   COLONOSCOPY WITH PROPOFOL  2017   ORCHIECTOMY Bilateral 09/08/2017   Procedure: ORCHIECTOMY;  Surgeon: Bjorn Pippin, MD;  Location: [facility];  Service: Urology;  Laterality: Bilateral;   ORIF HUMERUS FRACTURE Left 05/12/2018   Procedure: OPEN REDUCTION INTERNAL FIXATION (ORIF) LEFT PROXIMAL HUMERUS FRACTURE;  Surgeon: Myrene Galas, MD;  Location: [facility] OR;  Service: Orthopedics;  Laterality: Left;    I have reviewed the social history and family history with the patient and they are unchanged from previous note.  ALLERGIES:  has No Known Allergies.  MEDICATIONS:  Current Outpatient Medications  Medication Sig Dispense Refill   abiraterone acetate (ZYTIGA) 250 MG tablet Take 1 tablet (250 mg total) by mouth daily. Take with a low fat meal 30 tablet 2   aspirin EC 81 MG tablet Take 81 mg by mouth daily. Swallow whole.     Calcium Carb-Cholecalciferol (CALTRATE 600+D3 PO)  Take 2 tablets by mouth daily.     denosumab (XGEVA) 120 MG/1.7ML SOLN injection 1.7 ml     KLOR-CON M20 20 MEQ tablet TAKE 1 TABLET BY MOUTH EVERY DAY 90 tablet 1   Multiple Vitamin (MULTIVITAMIN) tablet Take 1 tablet by mouth daily.     predniSONE (DELTASONE) 5 MG tablet TAKE 1 TABLET BY MOUTH EVERY DAY WITH BREAKFAST 90 tablet 3   rosuvastatin (CRESTOR) 10 MG tablet TAKE 1 TABLET BY MOUTH EVERY DAY 90 tablet 1   tamsulosin (FLOMAX) 0.4 MG CAPS capsule TAKE 1 CAPSULE BY MOUTH EVERY DAY AFTER SUPPER 90 capsule 1   valsartan (DIOVAN) 80 MG tablet  Take 1 tablet (80 mg total) by mouth daily. 90 tablet 1   zolpidem (AMBIEN) 5 MG tablet Take 1 tablet (5 mg total) by mouth at bedtime as needed for sleep. 90 tablet 0   Current Facility-Administered Medications  Medication Dose Route Frequency Provider Last Rate Last Admin   betamethasone acetate-betamethasone sodium phosphate (CELESTONE) injection 6 mg  6 mg Other [place] Liter, DPM        PHYSICAL EXAMINATION: ECOG PERFORMANCE STATUS: 0 - Asymptomatic  Vitals:   02/03/23 0814  BP: 105/72  Pulse: 80  Temp: (!) 97.4 F (36.3 C)  SpO2: 100%   Wt Readings from Last 3 Encounters:  02/03/23 185 lb (83.9 kg)  11/10/22 182 lb (82.6 kg)  11/04/22 182 lb 14.4 oz (83 kg)     GENERAL:alert, no distress and comfortable SKIN: skin color, texture, turgor are normal, no rashes or significant lesions EYES: normal, Conjunctiva are pink and non-injected, sclera clear NECK: supple, thyroid normal size, non-tender, without nodularity LYMPH:  no palpable lymphadenopathy in the cervical, axillary  LUNGS: clear to auscultation and percussion with normal breathing effort HEART: regular rate & rhythm and no murmurs and no lower extremity edema ABDOMEN:abdomen soft, non-tender and normal bowel sounds Musculoskeletal:no cyanosis of digits and no clubbing  NEURO: alert & oriented x 3 with fluent speech, no focal motor/sensory deficits   LABORATORY DATA:  I have reviewed the data as listed    Latest Ref Rng & Units 01/31/2023    7:57 AM 11/01/2022    8:12 AM 08/02/2022    7:43 AM  CBC  WBC 4.0 - 10.5 K/uL 6.6  6.8  7.1   Hemoglobin 13.0 - 17.0 g/dL 19.1  47.8  29.5   Hematocrit 39.0 - 52.0 % 41.3  39.4  41.7   Platelets 150 - 400 K/uL 276  246  277         Latest Ref Rng & Units 01/31/2023    7:57 AM 11/01/2022    8:12 AM 09/07/2022   10:58 AM  CMP  Glucose 70 - 99 mg/dL 621  308  657   BUN 8 - 23 mg/dL 18  16  23    Creatinine 0.61 - 1.24 mg/dL 8.46  9.62  9.52   Sodium 135 -  145 mmol/L 139  140  139   Potassium 3.5 - 5.1 mmol/L 4.0  3.6  4.3   Chloride 98 - 111 mmol/L 103  105  101   CO2 22 - 32 mmol/L 29  25  27    Calcium 8.9 - 10.3 mg/dL 9.9  9.5  84.1   Total Protein 6.5 - 8.1 g/dL 6.8  6.8    Total Bilirubin <1.2 mg/dL 0.8  0.9    Alkaline Phos 38 - 126 U/L 46  45  AST 15 - 41 U/L 18  22    ALT 0 - 44 U/L 15  19        RADIOGRAPHIC STUDIES: I have personally reviewed the radiological images as listed and agreed with the findings in the report. No results found.    No orders of the defined types were placed in this encounter.  All questions were answered. The patient knows to call the clinic with any problems, questions or concerns. No barriers to learning was detected. The total time spent in the appointment was 25 minutes.     Malachy Mood, MD 02/03/2023

## 2023-02-17 ENCOUNTER — Other Ambulatory Visit (HOSPITAL_COMMUNITY): Payer: Self-pay

## 2023-02-17 ENCOUNTER — Other Ambulatory Visit: Payer: Self-pay

## 2023-02-17 NOTE — Progress Notes (Signed)
Specialty Pharmacy Refill Coordination Note  Ethan [name] is a 72 y.o. male contacted today regarding refills of specialty medication(s) Abiraterone Acetate   Patient requested Delivery   Delivery date: 02/22/23   Verified address: [address] [place], [place], [address]   Medication will be filled on 02/21/23.

## 2023-02-17 NOTE — Progress Notes (Signed)
Specialty Pharmacy Ongoing Clinical Assessment Note  Ethan [name] is a 72 y.o. male who is being followed by the specialty pharmacy service for RxSp Oncology   Patient's specialty medication(s) reviewed today: Abiraterone Acetate   Missed doses in the last 4 weeks: 0   Patient/Caregiver did not have any additional questions or concerns.   Therapeutic benefit summary: Patient is achieving benefit   Adverse events/side effects summary: Experienced adverse events/side effects (ongoing, tolerable fatigue)   Patient's therapy is appropriate to: Continue    Goals Addressed             This Visit's Progress    Slow Disease Progression       Patient is on track. Patient will maintain adherence.  Mr. Mense PSA has fluctuated slightly, but remains relatively stable.         Follow up:  6 months  Servando Snare Specialty Pharmacist

## 2023-02-21 ENCOUNTER — Other Ambulatory Visit: Payer: Self-pay

## 2023-03-15 ENCOUNTER — Other Ambulatory Visit: Payer: Self-pay

## 2023-03-16 ENCOUNTER — Other Ambulatory Visit: Payer: Self-pay | Admitting: Internal Medicine

## 2023-03-16 DIAGNOSIS — I10 Essential (primary) hypertension: Secondary | ICD-10-CM

## 2023-03-17 ENCOUNTER — Other Ambulatory Visit: Payer: Self-pay

## 2023-03-17 NOTE — Progress Notes (Signed)
 Specialty Pharmacy Refill Coordination Note  Ethan [name] is a 73 y.o. male contacted today regarding refills of specialty medication(s) Abiraterone  Acetate (ZYTIGA )   Patient requested Delivery   Delivery date: 03/22/23   Verified address: [address] [place], [place], [address]   Medication will be filled on 01.06.25.

## 2023-03-21 DIAGNOSIS — H52223 Regular astigmatism, bilateral: Secondary | ICD-10-CM | POA: Diagnosis not present

## 2023-03-22 ENCOUNTER — Encounter: Payer: Self-pay | Admitting: Hematology

## 2023-03-23 ENCOUNTER — Telehealth: Payer: Medicare Other | Admitting: Nurse Practitioner

## 2023-03-23 DIAGNOSIS — I10 Essential (primary) hypertension: Secondary | ICD-10-CM | POA: Diagnosis not present

## 2023-03-23 DIAGNOSIS — R059 Cough, unspecified: Secondary | ICD-10-CM

## 2023-03-23 MED ORDER — VALSARTAN 80 MG PO TABS: 80.0000 mg | ORAL_TABLET | Freq: Every day | ORAL | 1 refills | Status: DC

## 2023-03-23 MED ORDER — PROMETHAZINE-DM 6.25-15 MG/5ML PO SYRP: 5.0000 mL | ORAL_SOLUTION | Freq: Three times a day (TID) | ORAL | 0 refills | Status: AC | PRN

## 2023-03-23 NOTE — Progress Notes (Signed)
   Established Patient Office Visit  An audio/visual tele-health visit was completed today for this patient. I connected with  Ethan [name] on 03/23/23 utilizing audio/visual technology and verified that I am speaking with the correct person using two identifiers. The patient was located at their home, and I was located at the office of [facility] during the encounter. I discussed the limitations of evaluation and management by telemedicine. The patient expressed understanding and agreed to proceed.    Subjective   Patient ID: Ethan [name], male    DOB: Nov 02, 1950  Age: 73 y.o. MRN: [id number]  Chief Complaint  Patient presents with   Cough    HTN: requesting refill on valsartan . Has been taking this chronically and tolerating well. Last BP per chart review at goal. Last metabolic panel was checked 01/2023 by oncologist and identified stable kidney function and electrolytes.   Cough: Reports having had URI with overall symptom improvement but has lingering cough would like to discuss treatment options.     ROS: see HPI    Objective:     There were no vitals taken for this visit. BP Readings from Last 3 Encounters:  02/03/23 105/72  11/04/22 129/68  10/20/22 116/72   Wt Readings from Last 3 Encounters:  02/03/23 185 lb (83.9 kg)  11/10/22 182 lb (82.6 kg)  11/04/22 182 lb 14.4 oz (83 kg)      Physical Exam Comprehensive physical exam not completed today as office visit was conducted remotely.  Overall appears well, signs of acute distress.  Patient was alert and oriented, and appeared to have appropriate judgment.   No results found for any visits on 03/23/23.    The ASCVD Risk score (Arnett DK, et al., 2019) failed to calculate for the following reasons:   The systolic blood pressure is missing   The valid total cholesterol range is 130 to 320 mg/dL    Assessment & Plan:   Problem List Items Addressed This Visit        Cardiovascular and Mediastinum   Primary hypertension   Chronic Last labs and BP readings reviewed. Unfortunately patient unable to provide home-BP reading today.  For now, continue valsartan  80mg /day. Refill sent to pharmacy. F/U with PCP in 3-6 months.       Relevant Medications   valsartan  (DIOVAN ) 80 MG tablet     Other   Cough - Primary   Acute, per patient report it is improving overall.  Agree to sending in promethazine -dextromethorphan cough syrup for cough suppression. Patient warned to avoid driving/operating heavy machinery when taking cough syrup and also warned about increase risk of falls and encouraged to change positions slowly especially at night. He reports his understanding.       Relevant Medications   promethazine -dextromethorphan (PROMETHAZINE -DM) 6.25-15 MG/5ML syrup    Return in about 6 months (around 09/20/2023) for F/U with PCP.    Lauraine FORBES Pereyra, NP

## 2023-03-23 NOTE — Assessment & Plan Note (Signed)
 Chronic Last labs and BP readings reviewed. Unfortunately patient unable to provide home-BP reading today.  For now, continue valsartan 80mg /day. Refill sent to pharmacy. F/U with PCP in 3-6 months.

## 2023-03-23 NOTE — Assessment & Plan Note (Signed)
 Acute, per patient report it is improving overall.  Agree to sending in promethazine -dextromethorphan cough syrup for cough suppression. Patient warned to avoid driving/operating heavy machinery when taking cough syrup and also warned about increase risk of falls and encouraged to change positions slowly especially at night. He reports his understanding.

## 2023-03-28 DIAGNOSIS — H5203 Hypermetropia, bilateral: Secondary | ICD-10-CM | POA: Diagnosis not present

## 2023-04-13 ENCOUNTER — Other Ambulatory Visit: Payer: Self-pay

## 2023-04-13 NOTE — Progress Notes (Signed)
Specialty Pharmacy Refill Coordination Note  Ethan [name] is a 73 y.o. male contacted today regarding refills of specialty medication(s) Abiraterone Acetate [facility])   Patient requested Delivery   Delivery date: 04/21/23   Verified address: [address] [place], [place], [address]   Medication will be filled on 04/20/23.

## 2023-04-20 ENCOUNTER — Other Ambulatory Visit: Payer: Self-pay

## 2023-05-02 ENCOUNTER — Inpatient Hospital Stay: Payer: Medicare Other | Attending: Hematology

## 2023-05-02 DIAGNOSIS — Z8546 Personal history of malignant neoplasm of prostate: Secondary | ICD-10-CM | POA: Insufficient documentation

## 2023-05-02 DIAGNOSIS — Z9079 Acquired absence of other genital organ(s): Secondary | ICD-10-CM | POA: Diagnosis not present

## 2023-05-02 DIAGNOSIS — Z79899 Other long term (current) drug therapy: Secondary | ICD-10-CM | POA: Diagnosis not present

## 2023-05-02 DIAGNOSIS — C61 Malignant neoplasm of prostate: Secondary | ICD-10-CM

## 2023-05-02 LAB — CBC WITH DIFFERENTIAL (CANCER CENTER ONLY)
Abs Immature Granulocytes: 0.03 10*3/uL (ref 0.00–0.07)
Basophils Absolute: 0 10*3/uL (ref 0.0–0.1)
Basophils Relative: 0 %
Eosinophils Absolute: 0.4 10*3/uL (ref 0.0–0.5)
Eosinophils Relative: 4 %
HCT: 41 % (ref 39.0–52.0)
Hemoglobin: 13.6 g/dL (ref 13.0–17.0)
Immature Granulocytes: 0 %
Lymphocytes Relative: 27 %
Lymphs Abs: 2.2 10*3/uL (ref 0.7–4.0)
MCH: 30.6 pg (ref 26.0–34.0)
MCHC: 33.2 g/dL (ref 30.0–36.0)
MCV: 92.3 fL (ref 80.0–100.0)
Monocytes Absolute: 0.7 10*3/uL (ref 0.1–1.0)
Monocytes Relative: 8 %
Neutro Abs: 5 10*3/uL (ref 1.7–7.7)
Neutrophils Relative %: 61 %
Platelet Count: 261 10*3/uL (ref 150–400)
RBC: 4.44 MIL/uL (ref 4.22–5.81)
RDW: 12.2 % (ref 11.5–15.5)
WBC Count: 8.2 10*3/uL (ref 4.0–10.5)
nRBC: 0 % (ref 0.0–0.2)

## 2023-05-02 LAB — CMP (CANCER CENTER ONLY)
ALT: 18 U/L (ref 0–44)
AST: 18 U/L (ref 15–41)
Albumin: 4.4 g/dL (ref 3.5–5.0)
Alkaline Phosphatase: 49 U/L (ref 38–126)
Anion gap: 7 (ref 5–15)
BUN: 18 mg/dL (ref 8–23)
CO2: 29 mmol/L (ref 22–32)
Calcium: 9.7 mg/dL (ref 8.9–10.3)
Chloride: 104 mmol/L (ref 98–111)
Creatinine: 0.82 mg/dL (ref 0.61–1.24)
GFR, Estimated: >60 mL/min (ref >60)
Glucose, Bld: 112 mg/dL — ABNORMAL HIGH (ref 70–99)
Potassium: 4.1 mmol/L (ref 3.5–5.1)
Sodium: 140 mmol/L (ref 135–145)
Total Bilirubin: 0.7 mg/dL (ref 0.0–1.2)
Total Protein: 6.7 g/dL (ref 6.5–8.1)

## 2023-05-03 ENCOUNTER — Telehealth: Payer: Self-pay

## 2023-05-03 LAB — PROSTATE-SPECIFIC AG, SERUM (LABCORP): Prostate Specific Ag, Serum: 3.7 ng/mL (ref 0.0–4.0)

## 2023-05-04 ENCOUNTER — Other Ambulatory Visit: Payer: Self-pay | Admitting: Hematology

## 2023-05-04 NOTE — Progress Notes (Addendum)
 [facility] CONSULT NOTE  Patient Care Team: Etta Grandchild, MD as PCP - General (Internal Medicine) O'Neal, Ronnald Ramp, MD as PCP - Cardiology (Cardiology) Malachy Mood, MD as Attending Physician (Hematology and Oncology)  ASSESSMENT & PLAN:  Ethan [name] is a 73 y.o.male with history of hypertension, bilateral orchiectomy [facility] cancer. 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 He then had bilateral orchiectomy by Dr. Annabell Howells on 09/08/2017. He then started APP in 09/2017. PSA nadir 3.7 in 04/2023.  Current diagnosis: mHSPC Initial diagnosis: mHSPC Germline testing: Negative genetic testing on the multii-cancer panel.  Somatic testing: Guardant360 from 2023. TMB 8.74. MSI high not detected.  No reportable tumor related somatic alteration. Treatment: AAP. Post bilateral ochiectomy  The patient was counseled on the natural history of prostate cancer and the standard treatment options that are available for prostate cancer.  Patient has no symptoms from prostate cancer currently.  PSA appeared to be lowest over the past few years.  Recommend proceed with PSMA PET to assess for number of metastatic lesions.  Consider treatment for oligometastases and consolidation if amenable.  He has excellent performance status.  We discussed cardiovascular risk management.  We also discussed management of bone health.  He has pending dental evaluation for dental pain.  Recommend holding denosumab for now.  If no significant osteoporosis, will consider 1 final dose of Zometa after dental clearance.  New lower jaw pain pending seeing dentist.   Prostate cancer (HCC) Partial response Repeat PSMA PET Continue AAP.  History of bilateral orchiectomy  Primary hypertension On valsartan and report BP fine at home Refilled today.  Dyslipidemia, goal LDL below 100 On Crestor 10 mg daily  At risk for side effect of  medication Supportive Obtain bone mineral density study  Continue calcium (1000-1200 mg daily from food and supplements) and vitamin D3 (1000 IU daily) prevent diabetes Aggressive cardiovascular risk management Weight-bearing exercises as able (30 minutes per day) Limit alcohol consumption and avoid smoking   Orders Placed This Encounter  Procedures   DG Bone Density    Standing Status:   Future    Expiration Date:   05/04/2024    Reason for Exam (SYMPTOM  OR DIAGNOSIS REQUIRED):   assess bone density on prostate cancer treatment    Preferred imaging location?:   [address]   NM PET (PSMA) SKULL TO MID THIGH    Standing Status:   Future    Expected Date:   05/19/2023    Expiration Date:   05/04/2024    If indicated for the ordered procedure, I authorize the administration of a radiopharmaceutical per Radiology protocol:   Yes    Preferred imaging location?:   [facility]   The total time spent in the appointment was 35 minutes encounter with patients including review of chart and various tests results, discussions about plan of care and coordination of care plan  All questions were answered. The patient knows to call the clinic with any problems, questions or concerns. No barriers to learning was detected.  Melven Sartorius, MD 2/20/202512:49 PM  CHIEF COMPLAINTS/PURPOSE OF CONSULTATION:  Prostate cancer  HISTORY OF PRESENTING ILLNESS:  Ethan [name] 73 y.o. male is here because of prostate cancer. He initially was uncomfortable sitting down. He used to have PSA testing until not screening was recommended by [place] [employer]. As a result found to have elevated PSA and metastatic  prostate cancer at diagnosis.  I have reviewed his chart and materials related to his cancer extensively and collaborated history with the patient. Summary of oncologic history is as follows: Oncology History Overview Note  Negative genetic testing   Prostate cancer (HCC)  08/24/2017  Tumor Marker   PSA 352   08/31/2017 Surgery   Prostate biopsy showed high volume high-grade disease with a Gleason score 4+5 = 9 and multiple cores with a total of at least 11 out of 12 cores involved with cancer.    09/02/2017 Imaging   Bone scan  Multiple sites of abnormal increased tracer localization including midthoracic spine at T5, at LEFT L3, lateral RIGHT fourth rib, LEFT ischium, LEFT inferior pubic ramus and pubic body, and LEFT femoral neck consistent with osseous metastases.   09/08/2017 Surgery   orchiectomy   09/2017 -  Chemotherapy   Started Zytiga 1000 mg daily with prednisone  Also started Xgeva   10/27/2017 Initial Diagnosis   Prostate cancer (HCC)   12/15/2017 Tumor Marker   PSA 18.2   12/26/2017 Genetic Testing   Negative genetic testing on the multii-cancer panel.  The Multi-Gene Panel offered by [facility] includes sequencing and/or deletion duplication testing of the following 84 genes: AIP, ALK, APC, ATM, AXIN2,BAP1,  BARD1, BLM, BMPR1A, BRCA1, BRCA2, BRIP1, CASR, CDC73, CDH1, CDK4, CDKN1B, CDKN1C, CDKN2A (p14ARF), CDKN2A (p16INK4a), CEBPA, CHEK2, CTNNA1, DICER1, DIS3L2, EGFR (c.2369C>T, p.Thr790Met variant only), EPCAM (Deletion/duplication testing only), FH, FLCN, GATA2, GPC3, GREM1 (Promoter region deletion/duplication testing only), HOXB13 (c.251G>A, p.Gly84Glu), HRAS, KIT, MAX, MEN1, MET, MITF (c.952G>A, p.Glu318Lys variant only), MLH1, MSH2, MSH3, MSH6, MUTYH, NBN, NF1, NF2, NTHL1, PALB2, PDGFRA, PHOX2B, PMS2, POLD1, POLE, POT1, PRKAR1A, PTCH1, PTEN, RAD50, RAD51C, RAD51D, RB1, RECQL4, RET, RUNX1, SDHAF2, SDHA (sequence changes only), SDHB, SDHC, SDHD, SMAD4, SMARCA4, SMARCB1, SMARCE1, STK11, SUFU, TERC, TERT, TMEM127, TP53, TSC1, TSC2, VHL, WRN and WT1.  The report date is December 26, 2017.   06/28/2018 Imaging   CT AP 1. Decreased bilateral iliac lymphadenopathy since prior study. 2. Increased sclerosis of several bone metastases in lumbar spine and pelvis,  consistent with interval healing/response to therapy. 3. Decreased size of prostate gland. 4. No new or progressive metastatic disease identified.   08/01/2019 Imaging   CT  Musculoskeletal: Sclerotic metastases in the spine and pelvis are unchanged. 1. Interval decrease in size of bilateral iliac chain lymph nodes. Stable osseous metastatic disease. 2.  Aortic atherosclerosis (ICD10-I70.0).   04/16/2021 PET scan   PSMA PET 1. Focal lesion just LEFT of midline within the LEFT lobe of the prostate gland concerning for local prostate cancer recurrence. 2. Intensely radiotracer avid small external and common iliac lymph nodes consistent with local nodal metastasis in the pelvis. 3. Progression skeletal metastasis with several new lesions in the calvarium, spine, pelvis and proximal femur.   02/26/2022 PET scan   PSMA PET 1. No significant interval change from PSMA PET scan 04/16/2021. 2. Persistent radiotracer avid lesion in the RIGHT lobe of the prostate gland. 3. Persistent intensely radiotracer avid small metastatic iliac lymph nodes. 4. Multifocal intense radiotracer active skeletal metastasis. No change in size or number skeletal lesions. Activity without significant change.   02/2022 Miscellaneous   Decreased Abi to 250 mg with low fat diet.   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;  Service: Urology;  Laterality: Bilateral;   ORIF HUMERUS FRACTURE Left 05/12/2018   Procedure: OPEN REDUCTION INTERNAL FIXATION (ORIF) LEFT PROXIMAL HUMERUS FRACTURE;  Surgeon: Myrene Galas, MD;  Location: [facility] OR;  Service: Orthopedics;  Laterality: Left;    SOCIAL HISTORY: Social History   Socioeconomic History   Marital status: Married    Spouse name: Not on file   Number of children: 2   Years of education: Not on file   Highest education level: Bachelor's degree (e.g., BA, AB, BS)  Occupational History   Occupation: Theme park manager [employer]  Tobacco Use   Smoking status: Former    Current packs/day: 0.00    Types: Cigarettes    Start date: 09/08/1986    Quit date: 09/08/2006    Years since quitting: 16.6   Smokeless tobacco: Never  Vaping Use   Vaping status: Never Used  Substance and Sexual Activity   Alcohol use: Not Currently    Comment: 1 drink per day   Drug use: Never   Sexual activity: Yes    Partners: Female  Other Topics Concern   Not on file  Social History Narrative   Not on file   Social Drivers of Health   Financial Resource Strain: Low Risk  (03/23/2023)   Overall Financial Resource Strain (CARDIA)    Difficulty of Paying Living Expenses: Not very hard  Food Insecurity: No Food Insecurity (03/23/2023)   Hunger Vital Sign    Worried About Running Out of Food in the Last Year: Never true    Ran Out of Food in the Last Year: Never true  Transportation Needs: No Transportation Needs (03/23/2023)   PRAPARE - Administrator, Civil Service (Medical): No    Lack of Transportation (Non-Medical): No  Physical Activity: Sufficiently Active (03/23/2023)   Exercise Vital Sign     Days of Exercise per Week: 7 days    Minutes of Exercise per Session: 120 min  Stress: No Stress Concern Present (03/23/2023)   [employer] of Occupational Health - Occupational Stress Questionnaire    Feeling of Stress : Not at all  Social Connections: Socially Integrated (03/23/2023)   Social Connection and Isolation Panel [NHANES]    Frequency of Communication with Friends and Family: More than three times a week    Frequency of Social Gatherings with Friends and Family: Twice a week    Attends Religious Services: More than 4 times per year    Active Member of [employer] or Organizations: Yes    Attends Engineer, structural: More than 4 times per year    Marital Status: Married  Catering manager Violence: Not on file    FAMILY HISTORY: Family History  Problem Relation Age of Onset   Dementia  Mother    Kidney failure Father    Alcoholism Father    Congestive Heart Failure Brother    Lung cancer Maternal Grandmother    Heart attack Paternal Grandfather    Lung cancer Maternal Aunt    Lung cancer Maternal Uncle    Lung cancer Paternal Uncle     ALLERGIES:  has no known allergies.  MEDICATIONS:  Current Outpatient Medications  Medication Sig Dispense Refill   abiraterone acetate (ZYTIGA) 250 MG tablet Take 1 tablet (250 mg total) by mouth daily. Take with a low fat meal 30 tablet 2   aspirin EC 81 MG tablet Take 81 mg by mouth daily. Swallow whole.     Calcium Carb-Cholecalciferol (CALTRATE 600+D3 PO) Take 2 tablets by mouth daily.     denosumab (XGEVA) 120 MG/1.7ML SOLN injection 1.7 ml     KLOR-CON M20 20 MEQ tablet TAKE 1 TABLET BY MOUTH EVERY DAY 90 tablet 1   Multiple Vitamin (MULTIVITAMIN) tablet Take 1 tablet by mouth daily.     predniSONE (DELTASONE) 5 MG tablet TAKE 1 TABLET BY MOUTH EVERY DAY WITH BREAKFAST 90 tablet 3   promethazine-dextromethorphan (PROMETHAZINE-DM) 6.25-15 MG/5ML syrup Take 5 mLs by mouth 3 (three) times daily as needed for cough. 118 mL 0    rosuvastatin (CRESTOR) 10 MG tablet TAKE 1 TABLET BY MOUTH EVERY DAY 90 tablet 1   tamsulosin (FLOMAX) 0.4 MG CAPS capsule TAKE 1 CAPSULE BY MOUTH EVERY DAY AFTER SUPPER 90 capsule 1   valsartan (DIOVAN) 80 MG tablet Take 1 tablet (80 mg total) by mouth daily. 90 tablet 1   zolpidem (AMBIEN) 5 MG tablet Take 1 tablet (5 mg total) by mouth at bedtime as needed for sleep. 90 tablet 0   Current Facility-Administered Medications  Medication Dose Route Frequency Provider Last Rate Last Admin   betamethasone acetate-betamethasone sodium phosphate (CELESTONE) injection 6 mg  6 mg Other [place] Liter, DPM        REVIEW OF SYSTEMS:   All relevant systems were reviewed with the patient and are negative.  PHYSICAL EXAMINATION: ECOG PERFORMANCE STATUS: 0 - Asymptomatic  Vitals:   05/05/23 1056  BP: 99/61  Pulse: 91  Resp: 18  Temp: (!) 97.3 F (36.3 C)  SpO2: 98%   Filed Weights   05/05/23 1056  Weight: 185 lb 11.2 oz (84.2 kg)    GENERAL: alert, no distress and comfortable SKIN: skin color is normal, no jaundice, rashes EYES: sclera clear LUNGS: Effort normal, no respiratory distress.  Clear to auscultation bilaterally HEART: regular rate & rhythm and no lower extremity edema ABDOMEN: soft, non-tender and nondistended Musculoskeletal: no point tenderness  LABORATORY DATA:  I have reviewed the data as listed Lab Results  Component Value Date   WBC 8.2 05/02/2023   HGB 13.6 05/02/2023   HCT 41.0 05/02/2023   MCV 92.3 05/02/2023   PLT 261 05/02/2023   Recent Labs    11/01/22 0812 01/31/23 0757 05/02/23 0745  NA 140 139 140  K 3.6 4.0 4.1  CL 105 103 104  CO2 25 29 29   GLUCOSE 101* 115* 112*  BUN 16 18 18   CREATININE 0.90 0.97 0.82  CALCIUM 9.5 9.9 9.7  GFRNONAA >60 >60 >60  PROT 6.8 6.8 6.7  ALBUMIN 4.4 4.4 4.4  AST 22 18 18   ALT 19 15 18   ALKPHOS 45 46 49  BILITOT 0.9 0.8 0.7    RADIOGRAPHIC STUDIES: I have personally reviewed the radiological  images as  listed and agreed with the findings in the report. No results found.

## 2023-05-05 ENCOUNTER — Inpatient Hospital Stay: Payer: Medicare Other

## 2023-05-05 ENCOUNTER — Ambulatory Visit: Payer: Medicare Other

## 2023-05-05 VITALS — BP 99/61 | HR 91 | Temp 97.3°F | Resp 18 | Ht 70.0 in | Wt 185.7 lb

## 2023-05-05 DIAGNOSIS — Z79899 Other long term (current) drug therapy: Secondary | ICD-10-CM | POA: Diagnosis not present

## 2023-05-05 DIAGNOSIS — I10 Essential (primary) hypertension: Secondary | ICD-10-CM | POA: Diagnosis not present

## 2023-05-05 DIAGNOSIS — Z9079 Acquired absence of other genital organ(s): Secondary | ICD-10-CM | POA: Diagnosis not present

## 2023-05-05 DIAGNOSIS — Z9189 Other specified personal risk factors, not elsewhere classified: Secondary | ICD-10-CM | POA: Diagnosis not present

## 2023-05-05 DIAGNOSIS — E785 Hyperlipidemia, unspecified: Secondary | ICD-10-CM

## 2023-05-05 DIAGNOSIS — C61 Malignant neoplasm of prostate: Secondary | ICD-10-CM | POA: Diagnosis not present

## 2023-05-05 DIAGNOSIS — Z8546 Personal history of malignant neoplasm of prostate: Secondary | ICD-10-CM | POA: Diagnosis not present

## 2023-05-05 MED ORDER — VALSARTAN 80 MG PO TABS: 80.0000 mg | ORAL_TABLET | Freq: Every day | ORAL | 1 refills | Status: DC

## 2023-05-05 MED ORDER — TAMSULOSIN HCL 0.4 MG PO CAPS: ORAL_CAPSULE | ORAL | 1 refills | Status: DC

## 2023-05-05 NOTE — Assessment & Plan Note (Signed)
 Supportive Obtain bone mineral density study  Continue calcium (1000-1200 mg daily from food and supplements) and vitamin D3 (1000 IU daily) prevent diabetes Aggressive cardiovascular risk management Weight-bearing exercises as able (30 minutes per day) Limit alcohol consumption and avoid smoking

## 2023-05-05 NOTE — Assessment & Plan Note (Addendum)
 Partial response Repeat PSMA PET Continue AAP.  History of bilateral orchiectomy

## 2023-05-05 NOTE — Assessment & Plan Note (Signed)
On Crestor 10 mg daily 

## 2023-05-05 NOTE — Addendum Note (Signed)
 Addended by: Geanie Berlin on: 05/05/2023 12:52 PM   Modules accepted: Orders

## 2023-05-05 NOTE — Assessment & Plan Note (Addendum)
 On valsartan and report BP fine at home Refilled today.

## 2023-05-10 ENCOUNTER — Other Ambulatory Visit: Payer: Self-pay

## 2023-05-10 ENCOUNTER — Other Ambulatory Visit (HOSPITAL_COMMUNITY): Payer: Self-pay

## 2023-05-10 DIAGNOSIS — C61 Malignant neoplasm of prostate: Secondary | ICD-10-CM

## 2023-05-10 MED ORDER — ABIRATERONE ACETATE 250 MG PO TABS
250.0000 mg | ORAL_TABLET | Freq: Every day | ORAL | 2 refills | Status: DC
Start: 2023-05-10 — End: 2023-10-10
  Filled 2023-05-10: qty 30, 30d supply, fill #0
  Filled 2023-06-09: qty 30, 30d supply, fill #1
  Filled 2023-07-21: qty 30, 30d supply, fill #2

## 2023-05-10 NOTE — Progress Notes (Signed)
 Specialty Pharmacy Refill Coordination Note  Ethan [name] is a 73 y.o. male contacted today regarding refills of specialty medication(s) Abiraterone Acetate (ZYTIGA)   Patient requested Delivery   Delivery date: 05/18/23   Verified address: [address]  [place] [address]   Medication will be filled on 05/17/23.This fill date is pending response to refill request from provider. Patient is aware and if they have not received fill by intended date they must follow up with pharmacy.

## 2023-05-17 ENCOUNTER — Other Ambulatory Visit: Payer: Self-pay

## 2023-05-19 ENCOUNTER — Other Ambulatory Visit: Payer: Self-pay

## 2023-05-20 ENCOUNTER — Other Ambulatory Visit: Payer: Self-pay

## 2023-05-25 ENCOUNTER — Encounter (HOSPITAL_COMMUNITY)
Admission: RE | Admit: 2023-05-25 | Discharge: 2023-05-25 | Disposition: A | Discharge status: Home / Self Care | Source: Ambulatory Visit

## 2023-05-25 DIAGNOSIS — C801 Malignant (primary) neoplasm, unspecified: Secondary | ICD-10-CM | POA: Diagnosis not present

## 2023-05-25 DIAGNOSIS — C61 Malignant neoplasm of prostate: Secondary | ICD-10-CM | POA: Insufficient documentation

## 2023-05-25 DIAGNOSIS — C772 Secondary and unspecified malignant neoplasm of intra-abdominal lymph nodes: Secondary | ICD-10-CM | POA: Diagnosis not present

## 2023-05-25 DIAGNOSIS — C7951 Secondary malignant neoplasm of bone: Secondary | ICD-10-CM | POA: Diagnosis not present

## 2023-05-25 MED ORDER — FLOTUFOLASTAT F 18 GALLIUM 296-5846 MBQ/ML IV SOLN
8.0000 | Freq: Once | INTRAVENOUS | Status: DC
  Filled 2023-05-25: qty 8

## 2023-06-09 ENCOUNTER — Other Ambulatory Visit: Payer: Self-pay | Admitting: Pharmacy Technician

## 2023-06-09 ENCOUNTER — Other Ambulatory Visit: Payer: Self-pay

## 2023-06-09 NOTE — Progress Notes (Signed)
 Specialty Pharmacy Refill Coordination Note  Ethan [name] is a 73 y.o. male contacted today regarding refills of specialty medication(s) Abiraterone Acetate (ZYTIGA)   Patient requested (Patient-Rptd) Delivery   Delivery date: (Patient-Rptd) 07/04/23   Verified address: (Patient-Rptd) [address]   Medication will be filled on 07/01/23

## 2023-06-17 ENCOUNTER — Telehealth: Payer: Self-pay

## 2023-06-17 NOTE — Telephone Encounter (Signed)
 Per Dr. Nelta Numbers request, pt notified that he will review pt's PET scan with the tumor board at the end of the month; in the meantime, there will be no changes w/ medication(s) or POC. TV scheduled for 4/24 at 8:30 per request of Dr. Cherly Hensen.

## 2023-06-30 ENCOUNTER — Encounter: Payer: Self-pay | Admitting: Nurse Practitioner

## 2023-06-30 ENCOUNTER — Ambulatory Visit (INDEPENDENT_AMBULATORY_CARE_PROVIDER_SITE_OTHER)

## 2023-06-30 ENCOUNTER — Ambulatory Visit (INDEPENDENT_AMBULATORY_CARE_PROVIDER_SITE_OTHER): Admitting: Nurse Practitioner

## 2023-06-30 VITALS — BP 144/92 | Temp 98.4°F | Ht 70.0 in | Wt 187.0 lb

## 2023-06-30 DIAGNOSIS — W19XXXA Unspecified fall, initial encounter: Secondary | ICD-10-CM | POA: Diagnosis not present

## 2023-06-30 DIAGNOSIS — S40022A Contusion of left upper arm, initial encounter: Secondary | ICD-10-CM | POA: Diagnosis not present

## 2023-06-30 DIAGNOSIS — Z23 Encounter for immunization: Secondary | ICD-10-CM | POA: Diagnosis not present

## 2023-06-30 DIAGNOSIS — S59912D Unspecified injury of left forearm, subsequent encounter: Secondary | ICD-10-CM | POA: Diagnosis not present

## 2023-06-30 LAB — BASIC METABOLIC PANEL WITH GFR
BUN: 23 mg/dL (ref 6–23)
CO2: 30 meq/L (ref 19–32)
Calcium: 11 mg/dL — ABNORMAL HIGH (ref 8.4–10.5)
Chloride: 102 meq/L (ref 96–112)
Creatinine, Ser: 1.07 mg/dL (ref 0.40–1.50)
GFR: 68.77 mL/min (ref >60.00)
Glucose, Bld: 118 mg/dL — ABNORMAL HIGH (ref 70–99)
Potassium: 3.3 meq/L — ABNORMAL LOW (ref 3.5–5.1)
Sodium: 140 meq/L (ref 135–145)

## 2023-06-30 LAB — CBC WITH DIFFERENTIAL/PLATELET
Basophils Absolute: 0 10*3/uL (ref 0.0–0.1)
Basophils Relative: 0.4 % (ref 0.0–3.0)
Eosinophils Absolute: 0.2 10*3/uL (ref 0.0–0.7)
Eosinophils Relative: 1.9 % (ref 0.0–5.0)
HCT: 38 % — ABNORMAL LOW (ref 39.0–52.0)
Hemoglobin: 13 g/dL (ref 13.0–17.0)
Lymphocytes Relative: 20.7 % (ref 12.0–46.0)
Lymphs Abs: 2 10*3/uL (ref 0.7–4.0)
MCHC: 34.3 g/dL (ref 30.0–36.0)
MCV: 92.4 fl (ref 78.0–100.0)
Monocytes Absolute: 0.7 10*3/uL (ref 0.1–1.0)
Monocytes Relative: 7.1 % (ref 3.0–12.0)
Neutro Abs: 6.7 10*3/uL (ref 1.4–7.7)
Neutrophils Relative %: 69.9 % (ref 43.0–77.0)
Platelets: 292 10*3/uL (ref 150.0–400.0)
RBC: 4.12 Mil/uL — ABNORMAL LOW (ref 4.22–5.81)
RDW: 13.5 % (ref 11.5–15.5)
WBC: 9.6 10*3/uL (ref 4.0–10.5)

## 2023-06-30 LAB — PROTIME-INR
INR: 1.1 ratio — ABNORMAL HIGH (ref 0.8–1.0)
Prothrombin Time: 12 s (ref 9.6–13.1)

## 2023-06-30 NOTE — Assessment & Plan Note (Signed)
 Patient denies frequent falls, see additional recommendations under plan for hematoma.

## 2023-06-30 NOTE — Assessment & Plan Note (Addendum)
 Acute Large hematoma, I was concerned about possible compartment syndrome on exam.  I asked patient's PCP and my backup supervising physician Dr. Rochelle Chu to also come in and examine patient. No evidence of compartment syndrome identified, however recommendation was to get x-ray to look for any gas or air that may indicate possible infection.  On exam the absence of increased redness, tenderness, drainage, warmth is reassuring. X-ray results do not identify any air or gas. CBC, metabolic panel, and PT/INR also ordered for further evaluation today. Will hold off on antibiotics until lab results have resulted. Unfortunately he left the office before we were able to discuss updated tetanus vaccine.  Per chart review it appears that last Tdap administered in 2013.  Staff has been asked to call patient to verify whether or not last tetanus vaccine was administered in 2013 and if so to have him either come back to office to have this administered or proceed to pharmacy to have this administered. Dr. Rochelle Chu also suggested offering referral to orthopedic surgeon for evacuation of hematoma, will offer this to patient and may order referral per patient preference.

## 2023-06-30 NOTE — Telephone Encounter (Signed)
 Pt was seen about this issue on 06/30/23

## 2023-06-30 NOTE — Progress Notes (Signed)
 Established Patient Office Visit  Subjective   Patient ID: Ethan [name], male    DOB: [birth year]  Age: 73 y.o. MRN: [id number]  Chief Complaint  Patient presents with   Fall    Patient arrives today for acute visit. Patient reports he fell forward on concrete about 10 days ago.  He has a abrasion to his left forearm as well as significant bruising.  Patient is on aspirin 81 mg daily.  He denies any pain as well as numbness or tingling to his hand/arm.  Patient reports that area of concern has improved and come down in size over the last 10 days.    ROS: see HPI    Objective:     BP (!) 144/92   Temp 98.4 F (36.9 C) (Temporal)   Ht 5\' 10"  (1.778 m)   Wt 187 lb (84.8 kg)   SpO2 98%   BMI 26.83 kg/m  BP Readings from Last 3 Encounters:  06/30/23 (!) 144/92  05/05/23 99/61  02/03/23 105/72   Wt Readings from Last 3 Encounters:  06/30/23 187 lb (84.8 kg)  05/05/23 185 lb 11.2 oz (84.2 kg)  02/03/23 185 lb (83.9 kg)      Physical Exam Vitals reviewed.  Constitutional:      Appearance: Normal appearance.  HENT:     Head: Normocephalic and atraumatic.  Cardiovascular:     Rate and Rhythm: Normal rate and regular rhythm.     Pulses:          Radial pulses are 2+ on the left side.  Pulmonary:     Effort: Pulmonary effort is normal.     Breath sounds: Normal breath sounds.  Musculoskeletal:     Cervical back: Neck supple.  Skin:    General: Skin is warm and dry.       Neurological:     Mental Status: He is alert and oriented to person, place, and time.  Psychiatric:        Mood and Affect: Mood normal.        Behavior: Behavior normal.        Thought Content: Thought content normal.        Judgment: Judgment normal.      No results found for any visits on 06/30/23.    The ASCVD Risk score (Arnett DK, et al., 2019) failed to calculate for the following reasons:   The valid total cholesterol range is 130 to 320 mg/dL    Assessment & Plan:    Problem List Items Addressed This Visit       Other   Hematoma of arm, left, initial encounter - Primary   Acute Large hematoma, I was concerned about possible compartment syndrome on exam.  I asked patient's PCP and my backup supervising physician Dr. Yetta Barre to also come in and examine patient. No evidence of compartment syndrome identified, however recommendation was to get x-ray to look for any gas or air that may indicate possible infection.  On exam the absence of increased redness, tenderness, drainage, warmth is reassuring. X-ray results do not identify any air or gas. CBC, metabolic panel, and PT/INR also ordered for further evaluation today. Will hold off on antibiotics until lab results have resulted. Unfortunately he left the office before we were able to discuss updated tetanus vaccine.  Per chart review it appears that last Tdap administered in 2013.  Staff has been asked to call patient to verify whether or not last tetanus vaccine was administered in 2013  and if so to have him either come back to office to have this administered or proceed to pharmacy to have this administered. Dr. Rochelle Chu also suggested offering referral to orthopedic surgeon for evacuation of hematoma, will offer this to patient and may order referral per patient preference.       Relevant Orders   DG Forearm Left (Completed)   Basic metabolic panel with GFR   CBC with Differential/Platelet   Protime-INR   Fall   Patient denies frequent falls, see additional recommendations under plan for hematoma.      Relevant Orders   Protime-INR    Return for CPE when due .    Zorita Hiss, NP

## 2023-06-30 NOTE — Addendum Note (Signed)
 Addended by: Arch Beans, Wilma Michaelson P on: 06/30/2023 04:23 PM   Modules accepted: Orders

## 2023-07-01 ENCOUNTER — Other Ambulatory Visit: Payer: Self-pay

## 2023-07-04 ENCOUNTER — Other Ambulatory Visit: Payer: Self-pay | Admitting: Nurse Practitioner

## 2023-07-05 NOTE — Assessment & Plan Note (Signed)
 Supportive Obtain bone mineral density study  Continue calcium (1000-1200 mg daily from food and supplements) and vitamin D3 (1000 IU daily) prevent diabetes Aggressive cardiovascular risk management Weight-bearing exercises as able (30 minutes per day) Limit alcohol consumption and avoid smoking

## 2023-07-05 NOTE — Progress Notes (Unsigned)
 [facility] OFFICE PROGRESS NOTE  Patient Care Team: Arcadio Knuckles, MD as PCP - General (Internal Medicine) O'Neal, Cathay Clonts, MD as PCP - Cardiology (Cardiology) Sonja [place], MD as Attending Physician (Hematology and Oncology)  Telephone visit Physician location: [facility] Patient location: home  Ethan [name] is a 73 y.o.male with history of hypertension, bilateral orchiectomy [facility] cancer. 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 He then had bilateral orchiectomy by Dr. Inga Manges on 09/08/2017. He then started AAP in 09/2017. PSA nadir 3.7 in 04/2023.   Current diagnosis: mHSPC Initial diagnosis: mHSPC Germline testing: Negative genetic testing on the multii-cancer panel.  Somatic testing: Guardant360 from 2023. TMB 8.74. MSI high not detected.  No reportable tumor related somatic alteration. Treatment: AAP. Post bilateral ochiectomy  Reviewed and discussed his PSMA PET still has many metastatic lesions.  Given the findings, continue systemic treatment at this time.  We also discussed management of bone health.  He has pending dental evaluation for dental pain.  Recommend holding denosumab  for now.  If no significant osteoporosis, will consider 1 final dose of Zometa after dental clearance.  Assessment & Plan   No orders of the defined types were placed in this encounter.    Lowanda Ruddy, MD  INTERVAL HISTORY: he returns for treatment follow-up Complications related to previous cycle of chemotherapy included {NG Chemo Complications (Optional):32385}  Oncology History Overview Note  Negative genetic testing   Prostate cancer (HCC)  08/24/2017 Tumor Marker   PSA 352   08/31/2017 Surgery   Prostate biopsy showed high volume high-grade disease with a Gleason score 4+5 = 9 and multiple cores with a total of at least 11 out of 12 cores involved with cancer.    09/02/2017 Imaging   Bone scan   Multiple sites of abnormal increased tracer localization including midthoracic spine at T5, at LEFT L3, lateral RIGHT fourth rib, LEFT ischium, LEFT inferior pubic ramus and pubic body, and LEFT femoral neck consistent with osseous metastases.   09/08/2017 Surgery   orchiectomy   09/2017 -  Chemotherapy   Started Zytiga  1000 mg daily with prednisone   Also started Xgeva    10/27/2017 Initial Diagnosis   Prostate cancer (HCC)   12/15/2017 Tumor Marker   PSA 18.2   12/26/2017 Genetic Testing   Negative genetic testing on the multii-cancer panel.  The Multi-Gene Panel offered by [facility] includes sequencing and/or deletion duplication testing of the following 84 genes: AIP, ALK, APC, ATM, AXIN2,BAP1,  BARD1, BLM, BMPR1A, BRCA1, BRCA2, BRIP1, CASR, CDC73, CDH1, CDK4, CDKN1B, CDKN1C, CDKN2A (p14ARF), CDKN2A (p16INK4a), CEBPA, CHEK2, CTNNA1, DICER1, DIS3L2, EGFR (c.2369C>T, p.Thr790Met variant only), EPCAM (Deletion/duplication testing only), FH, FLCN, GATA2, GPC3, GREM1 (Promoter region deletion/duplication testing only), HOXB13 (c.251G>A, p.Gly84Glu), HRAS, KIT, MAX, MEN1, MET, MITF (c.952G>A, p.Glu318Lys variant only), MLH1, MSH2, MSH3, MSH6, MUTYH, NBN, NF1, NF2, NTHL1, PALB2, PDGFRA, PHOX2B, PMS2, POLD1, POLE, POT1, PRKAR1A, PTCH1, PTEN, RAD50, RAD51C, RAD51D, RB1, RECQL4, RET, RUNX1, SDHAF2, SDHA (sequence changes only), SDHB, SDHC, SDHD, SMAD4, SMARCA4, SMARCB1, SMARCE1, STK11, SUFU, TERC, TERT, TMEM127, TP53, TSC1, TSC2, VHL, WRN and WT1.  The report date is December 26, 2017.   06/28/2018 Imaging   CT AP 1. Decreased bilateral iliac lymphadenopathy since prior study. 2. Increased sclerosis of several bone metastases in lumbar spine and pelvis, consistent with interval healing/response to therapy. 3. Decreased size of prostate gland. 4. No new or progressive metastatic disease identified.  08/01/2019 Imaging   CT  Musculoskeletal: Sclerotic metastases in the spine and pelvis are unchanged. 1.  Interval decrease in size of bilateral iliac chain lymph nodes. Stable osseous metastatic disease. 2.  Aortic atherosclerosis (ICD10-I70.0).   04/16/2021 PET scan   PSMA PET 1. Focal lesion just LEFT of midline within the LEFT lobe of the prostate gland concerning for local prostate cancer recurrence. 2. Intensely radiotracer avid small external and common iliac lymph nodes consistent with local nodal metastasis in the pelvis. 3. Progression skeletal metastasis with several new lesions in the calvarium, spine, pelvis and proximal femur.   02/26/2022 PET scan   PSMA PET 1. No significant interval change from PSMA PET scan 04/16/2021. 2. Persistent radiotracer avid lesion in the RIGHT lobe of the prostate gland. 3. Persistent intensely radiotracer avid small metastatic iliac lymph nodes. 4. Multifocal intense radiotracer active skeletal metastasis. No change in size or number skeletal lesions. Activity without significant change.   02/2022 Miscellaneous   Decreased Abi to 250 mg with low fat diet.   11/01/2022 Tumor Marker   PSA 3.9   05/05/2023 Cancer Staging   Staging form: Prostate, AJCC 8th Edition - Clinical: Stage IVB (cTX, cN1, pM1b, PSA: 542, Grade Group: 5) - Signed by Lowanda Ruddy, MD on 05/05/2023 Prostate specific antigen (PSA) range: 20 or greater Histologic grading system: 5 grade system      PHYSICAL EXAMINATION: ECOG PERFORMANCE STATUS: {CHL ONC ECOG PS:[id number]}  There were no vitals filed for this visit. There were no vitals filed for this visit.  Relevant data reviewed during this visit included ***

## 2023-07-05 NOTE — Assessment & Plan Note (Signed)
 Partial response Continue AAP.  History of bilateral orchiectomy Lab in May

## 2023-07-07 ENCOUNTER — Inpatient Hospital Stay: Attending: Hematology

## 2023-07-07 ENCOUNTER — Encounter: Payer: Self-pay | Admitting: Nurse Practitioner

## 2023-07-07 DIAGNOSIS — C61 Malignant neoplasm of prostate: Secondary | ICD-10-CM

## 2023-07-07 DIAGNOSIS — Z9189 Other specified personal risk factors, not elsewhere classified: Secondary | ICD-10-CM

## 2023-07-08 ENCOUNTER — Ambulatory Visit: Admitting: Nurse Practitioner

## 2023-07-13 ENCOUNTER — Other Ambulatory Visit: Payer: Self-pay | Admitting: Internal Medicine

## 2023-07-13 DIAGNOSIS — E785 Hyperlipidemia, unspecified: Secondary | ICD-10-CM

## 2023-07-14 ENCOUNTER — Ambulatory Visit: Admitting: Nurse Practitioner

## 2023-07-14 ENCOUNTER — Encounter: Payer: Self-pay | Admitting: Nurse Practitioner

## 2023-07-14 ENCOUNTER — Other Ambulatory Visit: Payer: Self-pay | Admitting: Nurse Practitioner

## 2023-07-14 DIAGNOSIS — Z23 Encounter for immunization: Secondary | ICD-10-CM

## 2023-07-14 DIAGNOSIS — E876 Hypokalemia: Secondary | ICD-10-CM

## 2023-07-14 DIAGNOSIS — S40022A Contusion of left upper arm, initial encounter: Secondary | ICD-10-CM | POA: Diagnosis not present

## 2023-07-14 DIAGNOSIS — E785 Hyperlipidemia, unspecified: Secondary | ICD-10-CM

## 2023-07-14 LAB — BASIC METABOLIC PANEL WITH GFR
BUN: 22 mg/dL (ref 6–23)
CO2: 30 meq/L (ref 19–32)
Calcium: 11.6 mg/dL — ABNORMAL HIGH (ref 8.4–10.5)
Chloride: 102 meq/L (ref 96–112)
Creatinine, Ser: 0.97 mg/dL (ref 0.40–1.50)
GFR: 77.35 mL/min (ref >60.00)
Glucose, Bld: 109 mg/dL — ABNORMAL HIGH (ref 70–99)
Potassium: 4.2 meq/L (ref 3.5–5.1)
Sodium: 139 meq/L (ref 135–145)

## 2023-07-14 LAB — MAGNESIUM: Magnesium: 2.3 mg/dL (ref 1.5–2.5)

## 2023-07-14 MED ORDER — ROSUVASTATIN CALCIUM 10 MG PO TABS: 10.0000 mg | ORAL_TABLET | Freq: Every day | ORAL | 1 refills | Status: DC

## 2023-07-14 NOTE — Progress Notes (Signed)
 Established Patient Office Visit  Subjective   Patient ID: Ethan [name], male    DOB: Sep 29, 1950  Age: 73 y.o. MRN: [id number]  Chief Complaint  Patient presents with   hematoma    Hematoma: Patient was seen myself about 2 weeks ago after having had a fall 10 days prior to appointment.  He had hematoma on left arm.  He was treated with tetanus vaccine.  He arrives today for close follow-up.  Reports hematoma is much less painful and is getting smaller in size.  Hyperkalemia/Hypercalcemia: Incidental finding on labs as part of evaluation of hematoma 2 weeks ago.  He was told to increase potassium intake for a few days which he did, now he is on his chronic potassium 20 mEq daily.  He does have prostate cancer and takes about 1800 mg and calcium  supplementation daily.  Likely to help with preventing hypocalcemia as a side effect from his Xgeva .  Currently he has not had his Xgeva  injection for a while due to having dental issues.  He is asymptomatic currently.        Objective:     BP 118/80   Pulse 78   Temp 98.2 F (36.8 C) (Temporal)   Ht 5\' 10"  (1.778 m)   Wt 182 lb 6 oz (82.7 kg)   SpO2 99%   BMI 26.17 kg/m    Physical Exam Vitals reviewed.  Constitutional:      Appearance: Normal appearance.  HENT:     Head: Normocephalic and atraumatic.  Cardiovascular:     Rate and Rhythm: Normal rate and regular rhythm.  Pulmonary:     Effort: Pulmonary effort is normal.     Breath sounds: Normal breath sounds.  Musculoskeletal:     Cervical back: Neck supple.  Skin:    General: Skin is warm and dry.       Neurological:     Mental Status: He is alert and oriented to person, place, and time.  Psychiatric:        Mood and Affect: Mood normal.        Behavior: Behavior normal.        Thought Content: Thought content normal.        Judgment: Judgment normal.      Results for orders placed or performed in visit on 07/14/23  Magnesium  Result Value Ref Range    Magnesium 2.3 1.5 - 2.5 mg/dL  Basic metabolic panel with GFR  Result Value Ref Range   Sodium 139 135 - 145 mEq/L   Potassium 4.2 3.5 - 5.1 mEq/L   Chloride 102 96 - 112 mEq/L   CO2 30 19 - 32 mEq/L   Glucose, Bld 109 (H) 70 - 99 mg/dL   BUN 22 6 - 23 mg/dL   Creatinine, Ser 4.09 0.40 - 1.50 mg/dL   GFR 81.19 >14.78 mL/min   Calcium  11.6 (H) 8.4 - 10.5 mg/dL      The ASCVD Risk score (Arnett DK, et al., 2019) failed to calculate for the following reasons:   The valid total cholesterol range is 130 to 320 mg/dL    Assessment & Plan:   Problem List Items Addressed This Visit       Other   Hematoma of arm, left, initial encounter   Appears to be much improved compared to last office visit.  The size has decreased and pain has decreased.  Blood pressure has now normalized. Patient encouraged to continue monitoring hematoma, if any symptoms worsen he  was encouraged to let me know which point we could refer to general surgery for evacuation of hematoma.      Hypercalcemia - Primary   Possibly due to excessive supplement intake, recheck level as well as check PTH.  Further recommendations may be made based on his results. May consider reaching out to Dr. Janeann Mean with oncology to assist with plan pending results.       Relevant Orders   PTH, Intact and Calcium    Hypokalemia   Will recheck BMP/mag level, further recommendations may be made based upon these results.      Relevant Orders   Basic metabolic panel with GFR (Completed)   Magnesium (Completed)    Return in about 6 months (around 01/14/2024) for F/U with Isamar Wellbrock.    Zorita Hiss, NP

## 2023-07-14 NOTE — Assessment & Plan Note (Signed)
 Will recheck BMP/mag level, further recommendations may be made based upon these results.

## 2023-07-14 NOTE — Assessment & Plan Note (Signed)
 Possibly due to excessive supplement intake, recheck level as well as check PTH.  Further recommendations may be made based on his results. May consider reaching out to Dr. Janeann Mean with oncology to assist with plan pending results.

## 2023-07-14 NOTE — Addendum Note (Signed)
 Addended by: Arch Beans, Denice Cardon P on: 07/14/2023 11:52 AM   Modules accepted: Orders

## 2023-07-14 NOTE — Assessment & Plan Note (Signed)
 Appears to be much improved compared to last office visit.  The size has decreased and pain has decreased.  Blood pressure has now normalized. Patient encouraged to continue monitoring hematoma, if any symptoms worsen he was encouraged to let me know which point we could refer to general surgery for evacuation of hematoma.

## 2023-07-16 LAB — PTH, INTACT AND CALCIUM
Calcium: 11.5 mg/dL — ABNORMAL HIGH (ref 8.6–10.3)
PTH: <6 pg/mL — ABNORMAL LOW (ref 16–77)

## 2023-07-18 ENCOUNTER — Encounter: Payer: Self-pay | Admitting: Nurse Practitioner

## 2023-07-21 ENCOUNTER — Other Ambulatory Visit: Payer: Self-pay

## 2023-07-21 NOTE — Progress Notes (Signed)
 Specialty Pharmacy Refill Coordination Note  Ethan [name] is a 73 y.o. male contacted today regarding refills of specialty medication(s) Abiraterone  Acetate (ZYTIGA )   Patient requested (Patient-Rptd) Delivery   Delivery date: (Patient-Rptd) 08/02/23   Verified address: (Patient-Rptd) [address]  [phone]   Medication will be filled on 05.19.25.

## 2023-08-01 ENCOUNTER — Inpatient Hospital Stay: Payer: Medicare Other | Attending: Hematology

## 2023-08-01 DIAGNOSIS — Z79899 Other long term (current) drug therapy: Secondary | ICD-10-CM | POA: Diagnosis not present

## 2023-08-01 DIAGNOSIS — I10 Essential (primary) hypertension: Secondary | ICD-10-CM | POA: Diagnosis not present

## 2023-08-01 DIAGNOSIS — C7951 Secondary malignant neoplasm of bone: Secondary | ICD-10-CM | POA: Insufficient documentation

## 2023-08-01 DIAGNOSIS — C61 Malignant neoplasm of prostate: Secondary | ICD-10-CM | POA: Insufficient documentation

## 2023-08-01 LAB — CMP (CANCER CENTER ONLY)
ALT: 13 U/L (ref 0–44)
AST: 18 U/L (ref 15–41)
Albumin: 4.5 g/dL (ref 3.5–5.0)
Alkaline Phosphatase: 67 U/L (ref 38–126)
Anion gap: 6 (ref 5–15)
BUN: 24 mg/dL — ABNORMAL HIGH (ref 8–23)
CO2: 30 mmol/L (ref 22–32)
Calcium: 11 mg/dL — ABNORMAL HIGH (ref 8.9–10.3)
Chloride: 105 mmol/L (ref 98–111)
Creatinine: 0.95 mg/dL (ref 0.61–1.24)
GFR, Estimated: >60 mL/min (ref >60)
Glucose, Bld: 113 mg/dL — ABNORMAL HIGH (ref 70–99)
Potassium: 4 mmol/L (ref 3.5–5.1)
Sodium: 141 mmol/L (ref 135–145)
Total Bilirubin: 0.7 mg/dL (ref 0.0–1.2)
Total Protein: 6.8 g/dL (ref 6.5–8.1)

## 2023-08-01 LAB — CBC WITH DIFFERENTIAL (CANCER CENTER ONLY)
Abs Immature Granulocytes: 0.02 10*3/uL (ref 0.00–0.07)
Basophils Absolute: 0 10*3/uL (ref 0.0–0.1)
Basophils Relative: 1 %
Eosinophils Absolute: 0.3 10*3/uL (ref 0.0–0.5)
Eosinophils Relative: 4 %
HCT: 39 % (ref 39.0–52.0)
Hemoglobin: 13.3 g/dL (ref 13.0–17.0)
Immature Granulocytes: 0 %
Lymphocytes Relative: 28 %
Lymphs Abs: 2 10*3/uL (ref 0.7–4.0)
MCH: 31.1 pg (ref 26.0–34.0)
MCHC: 34.1 g/dL (ref 30.0–36.0)
MCV: 91.3 fL (ref 80.0–100.0)
Monocytes Absolute: 0.6 10*3/uL (ref 0.1–1.0)
Monocytes Relative: 8 %
Neutro Abs: 4.1 10*3/uL (ref 1.7–7.7)
Neutrophils Relative %: 59 %
Platelet Count: 260 10*3/uL (ref 150–400)
RBC: 4.27 MIL/uL (ref 4.22–5.81)
RDW: 12.3 % (ref 11.5–15.5)
WBC Count: 6.9 10*3/uL (ref 4.0–10.5)
nRBC: 0 % (ref 0.0–0.2)

## 2023-08-02 LAB — PROSTATE-SPECIFIC AG, SERUM (LABCORP): Prostate Specific Ag, Serum: 4.8 ng/mL — ABNORMAL HIGH (ref 0.0–4.0)

## 2023-08-02 LAB — TESTOSTERONE: Testosterone: <3 ng/dL — ABNORMAL LOW (ref 264–916)

## 2023-08-02 NOTE — Progress Notes (Signed)
 [facility] OFFICE PROGRESS NOTE  Patient Care Team: Arcadio Knuckles, MD as PCP - General (Internal Medicine) O'Neal, Cathay Clonts, MD as PCP - Cardiology (Cardiology) Sonja Moro, MD as Attending Physician (Hematology and Oncology)  Ethan [name] is a 73 y.o.male with history of hypertension, bilateral orchiectomy [facility]. 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 He then had bilateral orchiectomy by Dr. Inga Manges on 09/08/2017. He then started AAP in 09/2017. PSA nadir 3.7 in 04/2023.   Current diagnosis: mCRPC Initial diagnosis: mHSPC Germline testing: Negative genetic testing on the multii-cancer panel.  Somatic testing: Guardant360 from 2023. TMB 8.74. MSI high not detected.  No reportable tumor related somatic alteration. Treatment: AAP. Post bilateral ochiectomy   Reviewed and discussed his PSMA PET still has many metastatic lesions.  Given the findings, continue systemic treatment at this time.   Discussed chemotherapy with docetaxel with it's side effects. Alternatively with Pluvicto, just approved to be used prior to docetaxel. Discussed potential side effects. Given his has significant disease, he would like to be treated aggressively and interested in Pluvicto. I will refer him for Pluvicto.   We also discussed management of bone health. He completed dental procedures and was told can resume BMA. Will resume Xgeva  in 2-3 weeks.    Will request scheduling earlier appt of his dexa. Assessment & Plan Prostate cancer (HCC) Partial response Continue AAP until start Pluvicto.   History of bilateral orchiectomy Lab after visit with Dr. Dortha Gauss Primary hypertension On valsartan  and report BP fine at home Refilled today. Dyslipidemia, goal LDL below 100 On crestor   At risk for side effect of medication Supportive Obtain bone mineral density study  Continue calcium  (1000-1200 mg daily from food and  supplements) and vitamin D3 (1000 IU daily) prevent diabetes Aggressive cardiovascular risk management Weight-bearing exercises as able (30 minutes per day) Limit alcohol consumption and avoid smoking  Orders Placed This Encounter  Procedures   NM Radiologist Eval And Mgmt    Standing Status:   Future    Expected Date:   08/11/2023    Expiration Date:   08/03/2024    Reason for Exam (SYMPTOM  OR DIAGNOSIS REQUIRED):   mCRPC for Pluvicto    If indicated for the ordered procedure, I authorize the administration of a radiopharmaceutical per Radiology protocol:   Yes    Preferred imaging location?:   [facility]   CBC with Differential (Cancer Center Only)    Standing Status:   Future    Expiration Date:   08/03/2024   CMP (Cancer Center only)    Standing Status:   Future    Expiration Date:   08/03/2024   Testosterone     Standing Status:   Future    Expiration Date:   08/03/2024   Prostate-Specific AG, Serum    Standing Status:   Future    Expiration Date:   08/03/2024     Lowanda Ruddy, MD  INTERVAL HISTORY: Patient returns for follow-up. He has been walk everyday, 3-5 miles. Tired after that but recovered quickly. No persistent muscle pain or chest pain. No new bone or back pain, stomach. Appetite is good. No nausea, vomiting, diarrhea. He takes an extra flomax  a few times a week but not always.   Oncology History Overview Note  Negative genetic testing   Prostate cancer (HCC)  08/24/2017 Tumor Marker   PSA 352   08/31/2017 Surgery  Prostate biopsy showed high volume high-grade disease with a Gleason score 4+5 = 9 and multiple cores with a total of at least 11 out of 12 cores involved with cancer.    09/02/2017 Imaging   Bone scan  Multiple sites of abnormal increased tracer localization including midthoracic spine at T5, at LEFT L3, lateral RIGHT fourth rib, LEFT ischium, LEFT inferior pubic ramus and pubic body, and LEFT femoral neck consistent with osseous  metastases.   09/08/2017 Surgery   orchiectomy   09/2017 -  Chemotherapy   Started Zytiga  1000 mg daily with prednisone   Also started Xgeva    10/27/2017 Initial Diagnosis   Prostate cancer (HCC)   12/15/2017 Tumor Marker   PSA 18.2   12/26/2017 Genetic Testing   Negative genetic testing on the multii-cancer panel.  The Multi-Gene Panel offered by [facility] includes sequencing and/or deletion duplication testing of the following 84 genes: AIP, ALK, APC, ATM, AXIN2,BAP1,  BARD1, BLM, BMPR1A, BRCA1, BRCA2, BRIP1, CASR, CDC73, CDH1, CDK4, CDKN1B, CDKN1C, CDKN2A (p14ARF), CDKN2A (p16INK4a), CEBPA, CHEK2, CTNNA1, DICER1, DIS3L2, EGFR (c.2369C>T, p.Thr790Met variant only), EPCAM (Deletion/duplication testing only), FH, FLCN, GATA2, GPC3, GREM1 (Promoter region deletion/duplication testing only), HOXB13 (c.251G>A, p.Gly84Glu), HRAS, KIT, MAX, MEN1, MET, MITF (c.952G>A, p.Glu318Lys variant only), MLH1, MSH2, MSH3, MSH6, MUTYH, NBN, NF1, NF2, NTHL1, PALB2, PDGFRA, PHOX2B, PMS2, POLD1, POLE, POT1, PRKAR1A, PTCH1, PTEN, RAD50, RAD51C, RAD51D, RB1, RECQL4, RET, RUNX1, SDHAF2, SDHA (sequence changes only), SDHB, SDHC, SDHD, SMAD4, SMARCA4, SMARCB1, SMARCE1, STK11, SUFU, TERC, TERT, TMEM127, TP53, TSC1, TSC2, VHL, WRN and WT1.  The report date is December 26, 2017.   06/28/2018 Imaging   CT AP 1. Decreased bilateral iliac lymphadenopathy since prior study. 2. Increased sclerosis of several bone metastases in lumbar spine and pelvis, consistent with interval healing/response to therapy. 3. Decreased size of prostate gland. 4. No new or progressive metastatic disease identified.   08/01/2019 Imaging   CT  Musculoskeletal: Sclerotic metastases in the spine and pelvis are unchanged. 1. Interval decrease in size of bilateral iliac chain lymph nodes. Stable osseous metastatic disease. 2.  Aortic atherosclerosis (ICD10-I70.0).   04/16/2021 PET scan   PSMA PET 1. Focal lesion just LEFT of midline within the LEFT lobe  of the prostate gland concerning for local prostate cancer recurrence. 2. Intensely radiotracer avid small external and common iliac lymph nodes consistent with local nodal metastasis in the pelvis. 3. Progression skeletal metastasis with several new lesions in the calvarium, spine, pelvis and proximal femur.   02/26/2022 PET scan   PSMA PET 1. No significant interval change from PSMA PET scan 04/16/2021. 2. Persistent radiotracer avid lesion in the RIGHT lobe of the prostate gland. 3. Persistent intensely radiotracer avid small metastatic iliac lymph nodes. 4. Multifocal intense radiotracer active skeletal metastasis. No change in size or number skeletal lesions. Activity without significant change.   02/2022 Miscellaneous   Decreased Abi to 250 mg with low fat diet.   11/01/2022 Tumor Marker   PSA 3.9   05/05/2023 Cancer Staging   Staging form: Prostate, AJCC 8th Edition - Clinical: Stage IVB (cTX, cN1, pM1b, PSA: 542, Grade Group: 5) - Signed by Lowanda Ruddy, MD on 05/05/2023 Prostate specific antigen (PSA) range: 20 or greater Histologic grading system: 5 grade system    Current Outpatient Medications on File Prior to Visit  Medication Sig Dispense Refill   abiraterone  acetate (ZYTIGA ) 250 MG tablet Take 1 tablet (250 mg total) by mouth daily. Take with a low fat meal 30 tablet 2  Calcium  Carb-Cholecalciferol  (CALTRATE 600+D3 PO) Take 2 tablets by mouth daily.     denosumab  (XGEVA ) 120 MG/1.7ML SOLN injection 1.7 ml     KLOR-CON  M20 20 MEQ tablet TAKE 1 TABLET BY MOUTH EVERY DAY 90 tablet 1   Multiple Vitamin (MULTIVITAMIN) tablet Take 1 tablet by mouth daily.     predniSONE  (DELTASONE ) 5 MG tablet TAKE 1 TABLET BY MOUTH EVERY DAY WITH BREAKFAST 90 tablet 3   promethazine -dextromethorphan (PROMETHAZINE -DM) 6.25-15 MG/5ML syrup Take 5 mLs by mouth 3 (three) times daily as needed for cough. 118 mL 0   rosuvastatin  (CRESTOR ) 10 MG tablet Take 1 tablet (10 mg total) by mouth  daily. 90 tablet 1   tamsulosin  (FLOMAX ) 0.4 MG CAPS capsule TAKE 1 CAPSULE BY MOUTH EVERY DAY AFTER SUPPER 90 capsule 1   valsartan  (DIOVAN ) 80 MG tablet Take 1 tablet (80 mg total) by mouth daily. 90 tablet 1   zolpidem  (AMBIEN ) 5 MG tablet Take 1 tablet (5 mg total) by mouth at bedtime as needed for sleep. 90 tablet 0   Current Facility-Administered Medications on File Prior to Visit  Medication Dose Route Frequency Provider Last Rate Last Admin   betamethasone  acetate-betamethasone  sodium phosphate  (CELESTONE ) injection 6 mg  6 mg Other Once Camilo Cella, DPM         PHYSICAL EXAMINATION: ECOG PERFORMANCE STATUS: 0 - Asymptomatic  Vitals:   08/04/23 0842  BP: 118/81  Pulse: 93  Resp: 20  Temp: (!) 97.3 F (36.3 C)  SpO2: 98%   Filed Weights   08/04/23 0842  Weight: 176 lb (79.8 kg)    GENERAL: alert, no distress and comfortable SKIN: skin color normal   Relevant data reviewed during this visit included imaging and labs.

## 2023-08-04 ENCOUNTER — Inpatient Hospital Stay: Payer: Medicare Other

## 2023-08-04 VITALS — BP 118/81 | HR 93 | Temp 97.3°F | Resp 20 | Wt 176.0 lb

## 2023-08-04 DIAGNOSIS — Z9189 Other specified personal risk factors, not elsewhere classified: Secondary | ICD-10-CM | POA: Diagnosis not present

## 2023-08-04 DIAGNOSIS — E785 Hyperlipidemia, unspecified: Secondary | ICD-10-CM | POA: Diagnosis not present

## 2023-08-04 DIAGNOSIS — C61 Malignant neoplasm of prostate: Secondary | ICD-10-CM | POA: Diagnosis not present

## 2023-08-04 DIAGNOSIS — I10 Essential (primary) hypertension: Secondary | ICD-10-CM | POA: Diagnosis not present

## 2023-08-04 DIAGNOSIS — C7951 Secondary malignant neoplasm of bone: Secondary | ICD-10-CM | POA: Diagnosis not present

## 2023-08-04 DIAGNOSIS — Z79899 Other long term (current) drug therapy: Secondary | ICD-10-CM | POA: Diagnosis not present

## 2023-08-04 NOTE — Assessment & Plan Note (Addendum)
 Supportive Obtain bone mineral density study  Continue calcium (1000-1200 mg daily from food and supplements) and vitamin D3 (1000 IU daily) prevent diabetes Aggressive cardiovascular risk management Weight-bearing exercises as able (30 minutes per day) Limit alcohol consumption and avoid smoking

## 2023-08-04 NOTE — Assessment & Plan Note (Addendum)
 On crestor.

## 2023-08-04 NOTE — Assessment & Plan Note (Addendum)
 Partial response Continue AAP until start Pluvicto.   History of bilateral orchiectomy Lab after visit with Dr. Dortha Gauss

## 2023-08-04 NOTE — Assessment & Plan Note (Addendum)
 On valsartan and report BP fine at home Refilled today.

## 2023-08-09 ENCOUNTER — Other Ambulatory Visit: Payer: Self-pay

## 2023-08-09 DIAGNOSIS — C61 Malignant neoplasm of prostate: Secondary | ICD-10-CM

## 2023-08-10 ENCOUNTER — Other Ambulatory Visit: Payer: Self-pay | Admitting: Hematology

## 2023-08-10 ENCOUNTER — Ambulatory Visit (HOSPITAL_BASED_OUTPATIENT_CLINIC_OR_DEPARTMENT_OTHER)
Admission: RE | Admit: 2023-08-10 | Discharge: 2023-08-10 | Disposition: A | Discharge status: Home / Self Care | Source: Ambulatory Visit

## 2023-08-10 DIAGNOSIS — M810 Age-related osteoporosis without current pathological fracture: Secondary | ICD-10-CM | POA: Diagnosis not present

## 2023-08-10 DIAGNOSIS — Z9189 Other specified personal risk factors, not elsewhere classified: Secondary | ICD-10-CM | POA: Diagnosis not present

## 2023-08-10 DIAGNOSIS — C61 Malignant neoplasm of prostate: Secondary | ICD-10-CM | POA: Diagnosis not present

## 2023-08-10 DIAGNOSIS — Z13820 Encounter for screening for osteoporosis: Secondary | ICD-10-CM | POA: Diagnosis not present

## 2023-08-11 ENCOUNTER — Encounter (HOSPITAL_COMMUNITY)
Admission: RE | Admit: 2023-08-11 | Discharge: 2023-08-11 | Disposition: A | Discharge status: Home / Self Care | Source: Ambulatory Visit

## 2023-08-11 ENCOUNTER — Encounter: Payer: Self-pay | Admitting: Hematology

## 2023-08-11 ENCOUNTER — Ambulatory Visit: Payer: Self-pay

## 2023-08-11 DIAGNOSIS — C61 Malignant neoplasm of prostate: Secondary | ICD-10-CM | POA: Diagnosis not present

## 2023-08-11 DIAGNOSIS — R590 Localized enlarged lymph nodes: Secondary | ICD-10-CM | POA: Diagnosis not present

## 2023-08-11 NOTE — Consult Note (Signed)
 Chief Complaint: Patient with metastatic castrate resistant prostate cancer. Evaluation for   Lu 177 PSMA therapy (Pluvicto).  Referring Physician(s): Caralyn Chandler    Patient Status: [facility] - Out-pt  History of Present Illness: Ethan [name] is a 73 y.o. male  with metastatic prostate adenocarcinoma.     Initial diagnosis in 2019 with hormone sensitive prostate cancer.  High-grade pathology with Gleason equal 4+5 adenocarcinoma.  Bilateral orchiectomy in 2019 with nadir PSA of  3.7 from over 500.   Patient currently receiving Zytiga  anti androgen therapy.    Recent PSMA PET scan demonstrated multifocal radiotracer avid skeletal metastasis and mild adenopathy in the pelvis.    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    Prostate cancer metastatic to multiple sites [facility]) urologist-  dr Inga Manges   dx 08-25-2017--  Gleason 9, PSA 542,  vol 67cc--  METs to bone (multiple sites) and lymph nodes   Wears glasses     Past Surgical History:  Procedure Laterality Date   APPENDECTOMY  1963   COLONOSCOPY WITH PROPOFOL   2017   ORCHIECTOMY Bilateral 09/08/2017   Procedure: ORCHIECTOMY;  Surgeon: Homero Luster, MD;  Location: [facility];  Service: Urology;  Laterality: Bilateral;   ORIF HUMERUS FRACTURE Left 05/12/2018   Procedure: OPEN REDUCTION INTERNAL FIXATION (ORIF) LEFT PROXIMAL HUMERUS FRACTURE;  Surgeon: Hardy Lia, MD;  Location: [facility] OR;  Service: Orthopedics;  Laterality: Left;    Allergies: Patient has no known allergies.  Medications: Prior to Admission medications   Medication Sig Start Date End Date Taking? Authorizing Provider  abiraterone  acetate (ZYTIGA ) 250 MG tablet Take 1 tablet (250 mg total) by mouth daily. Take with a low fat meal 05/10/23   Lowanda Ruddy, MD  Calcium  Carb-Cholecalciferol  (CALTRATE 600+D3 PO) Take 2 tablets by mouth daily.    [provider]  denosumab   (XGEVA ) 120 MG/1.7ML SOLN injection 1.7 ml    [provider]  KLOR-CON  M20 20 MEQ tablet TAKE 1 TABLET BY MOUTH EVERY DAY 05/04/23   Sonja Sidney, MD  Multiple Vitamin (MULTIVITAMIN) tablet Take 1 tablet by mouth daily.    [provider]  predniSONE  (DELTASONE ) 5 MG tablet TAKE 1 TABLET BY MOUTH EVERY DAY WITH BREAKFAST 08/11/23   Lowanda Ruddy, MD  promethazine -dextromethorphan (PROMETHAZINE -DM) 6.25-15 MG/5ML syrup Take 5 mLs by mouth 3 (three) times daily as needed for cough. 03/23/23   Zorita Hiss, NP  rosuvastatin  (CRESTOR ) 10 MG tablet Take 1 tablet (10 mg total) by mouth daily. 07/14/23   Zorita Hiss, NP  tamsulosin  (FLOMAX ) 0.4 MG CAPS capsule TAKE 1 CAPSULE BY MOUTH EVERY DAY AFTER SUPPER 05/05/23   Lowanda Ruddy, MD  valsartan  (DIOVAN ) 80 MG tablet Take 1 tablet (80 mg total) by mouth daily. 05/05/23   Lowanda Ruddy, MD  zolpidem  (AMBIEN ) 5 MG tablet Take 1 tablet (5 mg total) by mouth at bedtime as needed for sleep. 02/03/23   Sonja Cibolo, MD     Family History  Problem Relation Age of Onset   Dementia Mother    Kidney failure Father    Alcoholism Father    Congestive Heart Failure Brother    Lung cancer Maternal Grandmother    Heart attack Paternal Grandfather    Lung cancer Maternal Aunt    Lung cancer Maternal Uncle    Lung cancer Paternal Uncle     Social History   Socioeconomic  History   Marital status: Married    Spouse name: Not on file   Number of children: 2   Years of education: Not on file   Highest education level: Bachelor's degree (e.g., BA, AB, BS)  Occupational History   Occupation: Theme park manager [employer]  Tobacco Use   Smoking status: Former    Current packs/day: 0.00    Types: Cigarettes    Start date: 09/08/1986    Quit date: 09/08/2006    Years since quitting: 16.9   Smokeless tobacco: Never  Vaping Use   Vaping status: Never Used  Substance and Sexual Activity   Alcohol use: Not Currently    Comment:  1 drink per day   Drug use: Never   Sexual activity: Yes    Partners: Female  Other Topics Concern   Not on file  Social History Narrative   Not on file   Social Drivers of Health   Financial Resource Strain: Low Risk  (03/23/2023)   Overall Financial Resource Strain (CARDIA)    Difficulty of Paying Living Expenses: Not very hard  Food Insecurity: No Food Insecurity (03/23/2023)   Hunger Vital Sign    Worried About Running Out of Food in the Last Year: Never true    Ran Out of Food in the Last Year: Never true  Transportation Needs: No Transportation Needs (03/23/2023)   PRAPARE - Administrator, Civil Service (Medical): No    Lack of Transportation (Non-Medical): No  Physical Activity: Sufficiently Active (03/23/2023)   Exercise Vital Sign    Days of Exercise per Week: 7 days    Minutes of Exercise per Session: 120 min  Stress: No Stress Concern Present (03/23/2023)   [employer] of Occupational Health - Occupational Stress Questionnaire    Feeling of Stress : Not at all  Social Connections: Socially Integrated (03/23/2023)   Social Connection and Isolation Panel [NHANES]    Frequency of Communication with Friends and Family: More than three times a week    Frequency of Social Gatherings with Friends and Family: Twice a week    Attends Religious Services: More than 4 times per year    Active Member of [employer] or Organizations: Yes    Attends Engineer, structural: More than 4 times per year    Marital Status: Married    ECOG Status: 0 - Asymptomatic  Review of Systems: A 12 point ROS discussed and pertinent positives are indicated in the HPI above.  All other systems are negative.  Review of Systems:  No bone pain. No incontinence   Vital Signs: There were no vitals taken for this visit.  Physical Exam Well appearing male.  Imaging: No results found.  Labs: Recent Labs    11/01/22 0812 01/31/23 0757 05/02/23 0745 08/01/23 0754  PSA1 3.9  4.1* 3.7 4.8*    CBC: Recent Labs    01/31/23 0757 05/02/23 0745 06/30/23 1402 08/01/23 0754  WBC 6.6 8.2 9.6 6.9  HGB 14.2 13.6 13.0 13.3  HCT 41.3 41.0 38.0* 39.0  PLT 276 261 292.0 260    COAGS: Recent Labs    06/30/23 1402  INR 1.1*    BMP: Recent Labs    11/01/22 0812 01/31/23 0757 05/02/23 0745 06/30/23 1402 07/14/23 0902 08/01/23 0754  NA 140 139 140 140 139 141  K 3.6 4.0 4.1 3.3* 4.2 4.0  CL 105 103 104 102 102 105  CO2 25 29 29 30 30 30   GLUCOSE 101*  115* 112* 118* 109* 113*  BUN 16 18 18 23 22  24*  CALCIUM  9.5 9.9 9.7 11.0* 11.5*  11.6* 11.0*  CREATININE 0.90 0.97 0.82 1.07 0.97 0.95  GFRNONAA >60 >60 >60  --   --  >60    LIVER FUNCTION TESTS: Recent Labs    11/01/22 0812 01/31/23 0757 05/02/23 0745 08/01/23 0754  BILITOT 0.9 0.8 0.7 0.7  AST 22 18 18 18   ALT 19 15 18 13   ALKPHOS 45 46 49 67  PROT 6.8 6.8 6.7 6.8  ALBUMIN  4.4 4.4 4.4 4.5    TUMOR MARKERS: PSA = 4.8  Assessment and Plan:  [Patient is good candidate Lu 177 PSMA therapy ( vipivotide tetraxetan).  Patient demonstrates mild-to-moderate progression metastatic [lymphadenopathy and skeletal disease] identified on recent PSMA PET scan.  Additionally patient's PSA is gradually increasing.  Patient has demonstrated progression on androgen deprivation therapy.   Patient explained major and minor risks and benefits of therapy.  Major benefit being progression-free survival.  Major risk being myelosuppression and renal toxicity. Minor toxicity of xerostomia.  All the patient's questions were answered.  Patient accompanied by daughter who was also present for consult.    Patient is scheduled for 6 treatments spaced 6 weeks apart.  Recommend following up with oncologist for CBC and CMP 1 week prior to each treatment to assess safety of continuing with therapy.      Thank you for this interesting consult.  I greatly enjoyed meeting Ethan [name] and look forward to participating in  their care.  A copy of this report was sent to the requesting provider on this date.  Electronically Signed: Reino Carbo, MD 08/11/2023, 11:32 AM   I spent a total of  30 Minutes   in face to face in clinical consultation, greater than 50% of which was counseling/coordinating care for metastatic neuroendocrine tumor.

## 2023-08-15 ENCOUNTER — Ambulatory Visit: Payer: Medicare Other | Admitting: Internal Medicine

## 2023-08-16 ENCOUNTER — Other Ambulatory Visit: Payer: Self-pay

## 2023-08-16 DIAGNOSIS — C61 Malignant neoplasm of prostate: Secondary | ICD-10-CM

## 2023-08-16 DIAGNOSIS — Z9189 Other specified personal risk factors, not elsewhere classified: Secondary | ICD-10-CM

## 2023-08-16 NOTE — Progress Notes (Signed)
 Scheduled for Pluvicto on 6/19 and 7/31.  Will return to see me about 1 week before 7/31, ie. 7/24 with labs, MD appt and then one unit of pRBC if Hgb <7.

## 2023-08-17 NOTE — Telephone Encounter (Signed)
 Secure chat sent to scheduling. Terrel Ferries, RN

## 2023-08-19 ENCOUNTER — Other Ambulatory Visit: Payer: Self-pay

## 2023-08-19 NOTE — Progress Notes (Signed)
 Specialty Pharmacy Ongoing Clinical Assessment Note  Delvecchio Madole is a 73 y.o. male who is being followed by the specialty pharmacy service for RxSp Oncology   Patient's specialty medication(s) reviewed today: Abiraterone  Acetate (ZYTIGA )   Missed doses in the last 4 weeks: 0   Patient/Caregiver did not have any additional questions or concerns.   Therapeutic benefit summary: Patient is NOT achieving benefit   Adverse events/side effects summary: No adverse events/side effects   Patient's therapy is appropriate to: Continue    Goals Addressed             This Visit's Progress    Slow Disease Progression   Worsening    Patient is not on track and worsening. Patient will maintain adherence and be monitored by provider to determine if a change in treatment plan is warranted.  At office visit on 08/04/23, it was noted that patient's PET scan came back with multiple metastatic lesions. Patient agreed to more aggressive therapy and will be starting Pluvicto on 6/19.         Follow up: 6 months  [facility]

## 2023-08-19 NOTE — Progress Notes (Signed)
 Per visit with Dr. Alita Irwin on 08/04/23, patient's disease had progressed. Dr. Alita Irwin and patient decided to start Pluvicto to treat aggressively. Patient to continue Zytiga  until initial dose of Pluvicto on 6/19. Disenrolled.

## 2023-08-22 ENCOUNTER — Inpatient Hospital Stay: Attending: Hematology

## 2023-08-22 ENCOUNTER — Ambulatory Visit

## 2023-08-22 ENCOUNTER — Inpatient Hospital Stay

## 2023-08-22 VITALS — HR 88 | Temp 97.8°F | Resp 18 | Wt 179.5 lb

## 2023-08-22 DIAGNOSIS — C61 Malignant neoplasm of prostate: Secondary | ICD-10-CM | POA: Insufficient documentation

## 2023-08-22 DIAGNOSIS — Z79899 Other long term (current) drug therapy: Secondary | ICD-10-CM | POA: Insufficient documentation

## 2023-08-22 LAB — CBC WITH DIFFERENTIAL (CANCER CENTER ONLY)
Abs Immature Granulocytes: 0.01 10*3/uL (ref 0.00–0.07)
Basophils Absolute: 0 10*3/uL (ref 0.0–0.1)
Basophils Relative: 1 %
Eosinophils Absolute: 0.3 10*3/uL (ref 0.0–0.5)
Eosinophils Relative: 4 %
HCT: 36.5 % — ABNORMAL LOW (ref 39.0–52.0)
Hemoglobin: 12.7 g/dL — ABNORMAL LOW (ref 13.0–17.0)
Immature Granulocytes: 0 %
Lymphocytes Relative: 24 %
Lymphs Abs: 1.5 10*3/uL (ref 0.7–4.0)
MCH: 31.3 pg (ref 26.0–34.0)
MCHC: 34.8 g/dL (ref 30.0–36.0)
MCV: 89.9 fL (ref 80.0–100.0)
Monocytes Absolute: 0.5 10*3/uL (ref 0.1–1.0)
Monocytes Relative: 7 %
Neutro Abs: 3.9 10*3/uL (ref 1.7–7.7)
Neutrophils Relative %: 64 %
Platelet Count: 238 10*3/uL (ref 150–400)
RBC: 4.06 MIL/uL — ABNORMAL LOW (ref 4.22–5.81)
RDW: 11.8 % (ref 11.5–15.5)
WBC Count: 6.2 10*3/uL (ref 4.0–10.5)
nRBC: 0 % (ref 0.0–0.2)

## 2023-08-22 LAB — SAMPLE TO BLOOD BANK

## 2023-08-22 LAB — CMP (CANCER CENTER ONLY)
ALT: 14 U/L (ref 0–44)
AST: 19 U/L (ref 15–41)
Albumin: 4.3 g/dL (ref 3.5–5.0)
Alkaline Phosphatase: 74 U/L (ref 38–126)
Anion gap: 8 (ref 5–15)
BUN: 23 mg/dL (ref 8–23)
CO2: 28 mmol/L (ref 22–32)
Calcium: 10.6 mg/dL — ABNORMAL HIGH (ref 8.9–10.3)
Chloride: 105 mmol/L (ref 98–111)
Creatinine: 0.97 mg/dL (ref 0.61–1.24)
GFR, Estimated: >60 mL/min (ref >60)
Glucose, Bld: 127 mg/dL — ABNORMAL HIGH (ref 70–99)
Potassium: 3.7 mmol/L (ref 3.5–5.1)
Sodium: 141 mmol/L (ref 135–145)
Total Bilirubin: 0.7 mg/dL (ref 0.0–1.2)
Total Protein: 6.5 g/dL (ref 6.5–8.1)

## 2023-08-22 MED ORDER — SODIUM CHLORIDE 0.9 % IV SOLN: INTRAVENOUS | Status: DC

## 2023-08-22 MED ORDER — ZOLEDRONIC ACID 4 MG/100ML IV SOLN
4.0000 mg | Freq: Once | INTRAVENOUS | Status: AC
  Administered 2023-08-22: 4 mg via INTRAVENOUS
  Filled 2023-08-22: qty 100

## 2023-08-22 NOTE — Patient Instructions (Signed)
 [facility] - A DEPT OF [facility]  Discharge Instructions: Thank you for choosing [facility] to provide your oncology and hematology care.   If you have a lab appointment with the Cancer Center, please go directly to the Cancer Center and check in at the registration area.   Wear comfortable clothing and clothing appropriate for easy access to any Portacath or PICC line.   We strive to give you quality time with your provider. You may need to reschedule your appointment if you arrive late (15 or more minutes).  Arriving late affects you and other patients whose appointments are after yours.  Also, if you miss three or more appointments without notifying the office, you may be dismissed from the clinic at the provider's discretion.      For prescription refill requests, have your pharmacy contact our office and allow 72 hours for refills to be completed.    Today you received the following chemotherapy and/or immunotherapy agents:  Zoledronic Acid Injection (Cancer) What is this medication? ZOLEDRONIC ACID (ZOE le dron ik AS id) treats high calcium  levels in the blood caused by cancer. It may also be used with chemotherapy to treat weakened bones caused by cancer. It works by slowing down the release of calcium  from bones. This lowers calcium  levels in your blood. It also makes your bones stronger and less likely to break (fracture). It belongs to a group of medications called bisphosphonates. This medicine may be used for other purposes; ask your health care provider or pharmacist if you have questions. COMMON BRAND NAME(S): Zometa, Zometa Powder What should I tell my care team before I take this medication? They need to know if you have any of these conditions: Dehydration Dental disease Kidney disease Liver disease Low levels of calcium  in the blood Lung or breathing disease, such as asthma Receiving steroids, such as dexamethasone  or  prednisone  An unusual or allergic reaction to zoledronic acid, other medications, foods, dyes, or preservatives Pregnant or trying to get pregnant Breast-feeding How should I use this medication? This medication is injected into a vein. It is given by your care team in a hospital or clinic setting. Talk to your care team about the use of this medication in children. Special care may be needed. Overdosage: If you think you have taken too much of this medicine contact a poison control center or emergency room at once. NOTE: This medicine is only for you. Do not share this medicine with others. What if I miss a dose? Keep appointments for follow-up doses. It is important not to miss your dose. Call your care team if you are unable to keep an appointment. What may interact with this medication? Certain antibiotics given by injection Diuretics, such as bumetanide, furosemide NSAIDs, medications for pain and inflammation, such as ibuprofen or naproxen Teriparatide Thalidomide This list may not describe all possible interactions. Give your health care provider a list of all the medicines, herbs, non-prescription drugs, or dietary supplements you use. Also tell them if you smoke, drink alcohol, or use illegal drugs. Some items may interact with your medicine. What should I watch for while using this medication? Visit your care team for regular checks on your progress. It may be some time before you see the benefit from this medication. Some people who take this medication have severe bone, joint, or muscle pain. This medication may also increase your risk for jaw problems or a broken thigh bone.  Tell your care team right away if you have severe pain in your jaw, bones, joints, or muscles. Tell you care team if you have any pain that does not go away or that gets worse. Tell your dentist and dental surgeon that you are taking this medication. You should not have major dental surgery while on this  medication. See your dentist to have a dental exam and fix any dental problems before starting this medication. Take good care of your teeth while on this medication. Make sure you see your dentist for regular follow-up appointments. You should make sure you get enough calcium  and vitamin D  while you are taking this medication. Discuss the foods you eat and the vitamins you take with your care team. Check with your care team if you have severe diarrhea, nausea, and vomiting, or if you sweat a lot. The loss of too much body fluid may make it dangerous for you to take this medication. You may need bloodwork while taking this medication. Talk to your care team if you wish to become pregnant or think you might be pregnant. This medication can cause serious birth defects. What side effects may I notice from receiving this medication? Side effects that you should report to your care team as soon as possible: Allergic reactions--skin rash, itching, hives, swelling of the face, lips, tongue, or throat Kidney injury--decrease in the amount of urine, swelling of the ankles, hands, or feet Low calcium  level--muscle pain or cramps, confusion, tingling, or numbness in the hands or feet Osteonecrosis of the jaw--pain, swelling, or redness in the mouth, numbness of the jaw, poor healing after dental work, unusual discharge from the mouth, visible bones in the mouth Severe bone, joint, or muscle pain Side effects that usually do not require medical attention (report to your care team if they continue or are bothersome): Constipation Fatigue Fever Loss of appetite Nausea Stomach pain This list may not describe all possible side effects. Call your doctor for medical advice about side effects. You may report side effects to FDA at 1-800-FDA-1088. Where should I keep my medication? This medication is given in a hospital or clinic. It will not be stored at home. NOTE: This sheet is a summary. It may not cover all  possible information. If you have questions about this medicine, talk to your doctor, pharmacist, or health care provider.  2024 Elsevier/Gold Standard (2021-04-24 00:00:00)   To help prevent nausea and vomiting after your treatment, we encourage you to take your nausea medication as directed.  BELOW ARE SYMPTOMS THAT SHOULD BE REPORTED IMMEDIATELY: *FEVER GREATER THAN 100.4 F (38 C) OR HIGHER *CHILLS OR SWEATING *NAUSEA AND VOMITING THAT IS NOT CONTROLLED WITH YOUR NAUSEA MEDICATION *UNUSUAL SHORTNESS OF BREATH *UNUSUAL BRUISING OR BLEEDING *URINARY PROBLEMS (pain or burning when urinating, or frequent urination) *BOWEL PROBLEMS (unusual diarrhea, constipation, pain near the anus) TENDERNESS IN MOUTH AND THROAT WITH OR WITHOUT PRESENCE OF ULCERS (sore throat, sores in mouth, or a toothache) UNUSUAL RASH, SWELLING OR PAIN  UNUSUAL VAGINAL DISCHARGE OR ITCHING   Items with * indicate a potential emergency and should be followed up as soon as possible or go to the Emergency Department if any problems should occur.  Please show the CHEMOTHERAPY ALERT CARD or IMMUNOTHERAPY ALERT CARD at check-in to the Emergency Department and triage nurse.  Should you have questions after your visit or need to cancel or reschedule your appointment, please contact [facility] - A DEPT OF [place].  [facility]  Dept: [phone]  and follow the prompts.  Office hours are 8:00 a.m. to 4:30 p.m. Monday - Friday. Please note that voicemails left after 4:00 p.m. may not be returned until the following business day.  We are closed weekends and major holidays. You have access to a nurse at all times for urgent questions. Please call the main number to the clinic Dept: [phone] and follow the prompts.   For any non-urgent questions, you may also contact your provider using MyChart. We now offer e-Visits for anyone 24 and older to request care online for non-urgent symptoms. For details visit  [email or web address].   Also download the MyChart app! Go to the app store, search "MyChart", open the app, select [place], and log in with your MyChart username and password.

## 2023-08-23 LAB — TESTOSTERONE: Testosterone: <3 ng/dL — ABNORMAL LOW (ref 264–916)

## 2023-08-23 LAB — PROSTATE-SPECIFIC AG, SERUM (LABCORP): Prostate Specific Ag, Serum: 4.4 ng/mL — ABNORMAL HIGH (ref 0.0–4.0)

## 2023-08-25 ENCOUNTER — Telehealth: Payer: Self-pay

## 2023-08-25 NOTE — Telephone Encounter (Signed)
 TC from Brandon at [facility], wanting to know the name of the medication which needs authorization, which is Pluvicto. This RN told Concha Deed and she had questions about which billing route to take to get medication authorized. Informed someone would call back from authorizations. Informed Perley Bradley of the above. She states she will f/u.

## 2023-08-31 NOTE — Written Directive (Addendum)
  PLUVICTO  THERAPY   RADIOPHARMACEUTICAL: Lutetium 177 vipivotide tetraxetan (Pluvicto)     PRESCRIBED DOSE FOR ADMINISTRATION:  200 mCi   ROUTE OFADMINISTRATION:  IV   DIAGNOSIS:  Prostate Cancer   REFERRING PHYSICIAN: Dr. Janeann Mean   TREATMENT #: 1   ADDITIONAL PHYSICIAN COMMENTS/NOTES:  NO labs today AUTHORIZED USER SIGNATURE & TIME STAMP: Reino Carbo, MD   09/01/23    9:26 AM

## 2023-09-01 ENCOUNTER — Encounter (HOSPITAL_COMMUNITY)
Admission: RE | Admit: 2023-09-01 | Discharge: 2023-09-01 | Disposition: A | Discharge status: Home / Self Care | Source: Ambulatory Visit

## 2023-09-01 DIAGNOSIS — C61 Malignant neoplasm of prostate: Secondary | ICD-10-CM | POA: Insufficient documentation

## 2023-09-01 DIAGNOSIS — C7951 Secondary malignant neoplasm of bone: Secondary | ICD-10-CM | POA: Diagnosis not present

## 2023-09-01 DIAGNOSIS — R9721 Rising PSA following treatment for malignant neoplasm of prostate: Secondary | ICD-10-CM | POA: Diagnosis not present

## 2023-09-01 MED ORDER — SODIUM CHLORIDE 0.9 % IV BOLUS
1000.0000 mL | Freq: Once | INTRAVENOUS | Status: AC
  Administered 2023-09-01: 1000 mL via INTRAVENOUS

## 2023-09-01 MED ORDER — SODIUM CHLORIDE 0.9 % IV BOLUS: 1000.0000 mL | Freq: Once | INTRAVENOUS | Status: DC

## 2023-09-01 MED ORDER — ONDANSETRON HCL 4 MG PO TABS: 4.0000 mg | ORAL_TABLET | Freq: Three times a day (TID) | ORAL | 0 refills | Status: AC | PRN

## 2023-09-01 MED ORDER — LUTETIUM LU 177 VIPIVOTIDE TET 1000 MBQ/ML IV SOLN
217.7960 | Freq: Once | INTRAVENOUS | Status: AC
  Administered 2023-09-01: 217.796 via INTRAVENOUS

## 2023-09-01 NOTE — Progress Notes (Signed)
 CLINICAL DATA: [Prostate carcinoma.  73 year old male with castrate resistant metastatic prostate adenocarcinoma.  Metastasis within the bones and lymph nodes identified by PSMA PET scan.]  PSA mildly elevated.      EXAM: NUCLEAR MEDICINE PLUVICTO INJECTION  TECHNIQUE: Infusion: The nuclear medicine technologist and I personally verified the dose activity to be delivered as specified in the written directive, and verified the patient identification via 2 separate methods.  Initial flush of the intravenous catheter was performed was sterile saline. The dose syringe was connected to the catheter and the Lu-177 Pluvicto administered over a 1 to 10 min infusion. Single 10 cc  lushes with normal saline follow the dose. No complications were noted. The entire IV tubing, venocatheter, stopcock and syringes was removed in total, placed in a disposal bag and sent for assay of the residual activity, which will be reported at a later time in our EMR by the physics staff. Pressure was applied to the venipuncture site, and a compression bandage placed. Patient monitored for 1 hour following infusion.    Radiation Safety personnel were present to perform the discharge survey, as detailed on their documentation. After a short period of observation, the patient had his IV removed.  RADIOPHARMACEUTICALS: [217.8  Microcuries Lu-177 PLUVICTO  FINDINGS: Current Infusion: [1]  Planned Infusions: 6    Patient presented to nuclear medicine for treatment. The patient's most recent blood counts were reviewed and remains a good candidate to proceed with Lu-177 Pluvicto. The patient was situated in an infusion suite with a contact barrier placed under the arm. Intravenous access was established, using sterile technique, and a normal saline infusion from a syringe was started.     Micro-dosimetry: The prescribed radiation activity was assayed and confirmed to be within specified tolerance.  IMPRESSION: Current  Infusion: [1]  Planned Infusions: 6    [The patient tolerated the infusion well. The patient will return in 6 weeks for ongoing care.]

## 2023-09-05 ENCOUNTER — Other Ambulatory Visit: Payer: Self-pay | Admitting: *Deleted

## 2023-09-05 MED ORDER — TAMSULOSIN HCL 0.4 MG PO CAPS: ORAL_CAPSULE | ORAL | 1 refills | Status: DC

## 2023-10-06 ENCOUNTER — Other Ambulatory Visit

## 2023-10-06 ENCOUNTER — Ambulatory Visit

## 2023-10-10 ENCOUNTER — Other Ambulatory Visit: Payer: Self-pay

## 2023-10-10 DIAGNOSIS — M818 Other osteoporosis without current pathological fracture: Secondary | ICD-10-CM | POA: Insufficient documentation

## 2023-10-10 DIAGNOSIS — C61 Malignant neoplasm of prostate: Secondary | ICD-10-CM

## 2023-10-10 DIAGNOSIS — D649 Anemia, unspecified: Secondary | ICD-10-CM

## 2023-10-10 NOTE — Assessment & Plan Note (Signed)
 On valsartan and report BP fine at home Refilled today.

## 2023-10-10 NOTE — Assessment & Plan Note (Addendum)
 Continue C2 Pluvicto .   History of bilateral orchiectomy Lab and visit about 1 week before next cycle

## 2023-10-10 NOTE — Assessment & Plan Note (Addendum)
 Supportive Obtain bone mineral density study  Continue calcium (1000-1200 mg daily from food and supplements) and vitamin D3 (1000 IU daily) prevent diabetes Aggressive cardiovascular risk management Weight-bearing exercises as able (30 minutes per day) Limit alcohol consumption and avoid smoking

## 2023-10-10 NOTE — Progress Notes (Unsigned)
 [facility] OFFICE PROGRESS NOTE  Patient Care Team: Elnor Lauraine BRAVO, NP as PCP - General (Nurse Practitioner) Barbaraann, Darryle Ned, MD as PCP - Cardiology (Cardiology) Tina Pauletta BROCKS, MD as Consulting Physician (Hematology and Oncology)  Ethan [name] is a 73 y.o.male with history of hypertension, bilateral orchiectomy [facility] cancer. 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 He then had bilateral orchiectomy by Dr. Watt on 09/08/2017. He then started AAP in 09/2017. PSA nadir 3.7 in 04/2023.   Current diagnosis: mCRPC Initial diagnosis: mHSPC Germline testing: Negative genetic testing on the multii-cancer panel.  Somatic testing: Guardant360 from 2023. TMB 8.74. MSI high not detected.  No reportable tumor related somatic alteration. Previous Treatment: 09/2017-08/2023 AAP. PSA nadir 3.7 in 04/2023. Post bilateral ochiectomy Current treatment: 09/01/2023 - current Pluvicto  Assessment & Plan Prostate cancer (HCC) Continue C2 Pluvicto .   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 Routine dental care Continue calcium  (1000-1200 mg daily from food and supplements) and vitamin D3 (1000 IU daily) prevent diabetes At risk for side effect of medication Supportive Obtain bone mineral density study  Continue calcium  (1000-1200 mg daily from food and supplements) and vitamin D3 (1000 IU daily) prevent diabetes Aggressive cardiovascular risk management Weight-bearing exercises as able (30 minutes per day) Limit alcohol consumption and avoid smoking   Follow up on 9/9, lab and then visit.  No orders of the defined types were placed in this encounter.    Pauletta BROCKS Tina, MD  INTERVAL HISTORY: Patient returns for follow-up. Started Pluvicto   Oncology History Overview Note  Negative genetic testing   Prostate cancer (HCC)   08/24/2017 Tumor Marker   PSA 352   08/31/2017 Surgery   Prostate biopsy showed high volume high-grade disease with a Gleason score 4+5 = 9 and multiple cores with a total of at least 11 out of 12 cores involved with cancer.    09/02/2017 Imaging   Bone scan  Multiple sites of abnormal increased tracer localization including midthoracic spine at T5, at LEFT L3, lateral RIGHT fourth rib, LEFT ischium, LEFT inferior pubic ramus and pubic body, and LEFT femoral neck consistent with osseous metastases.   09/08/2017 Surgery   orchiectomy   09/2017 -  Chemotherapy   Started Zytiga  1000 mg daily with prednisone   Also started Xgeva    10/27/2017 Initial Diagnosis   Prostate cancer (HCC)   12/15/2017 Tumor Marker   PSA 18.2   12/26/2017 Genetic Testing   Negative genetic testing on the multii-cancer panel.  The Multi-Gene Panel offered by [facility] includes sequencing and/or deletion duplication testing of the following 84 genes: AIP, ALK, APC, ATM, AXIN2,BAP1,  BARD1, BLM, BMPR1A, BRCA1, BRCA2, BRIP1, CASR, CDC73, CDH1, CDK4, CDKN1B, CDKN1C, CDKN2A (p14ARF), CDKN2A (p16INK4a), CEBPA, CHEK2, CTNNA1, DICER1, DIS3L2, EGFR (c.2369C>T, p.Thr790Met variant only), EPCAM (Deletion/duplication testing only), FH, FLCN, GATA2, GPC3, GREM1 (Promoter region deletion/duplication testing only), HOXB13 (c.251G>A, p.Gly84Glu), HRAS, KIT, MAX, MEN1, MET, MITF (c.952G>A, p.Glu318Lys variant only), MLH1, MSH2, MSH3, MSH6, MUTYH, NBN, NF1, NF2, NTHL1, PALB2, PDGFRA, PHOX2B, PMS2, POLD1, POLE, POT1, PRKAR1A, PTCH1, PTEN, RAD50, RAD51C, RAD51D, RB1, RECQL4, RET, RUNX1, SDHAF2, SDHA (sequence changes only), SDHB, SDHC, SDHD, SMAD4, SMARCA4, SMARCB1, SMARCE1, STK11, SUFU, TERC, TERT, TMEM127, TP53, TSC1, TSC2, VHL, WRN and WT1.  The report date is December 26, 2017.   06/28/2018 Imaging   CT AP 1. Decreased bilateral  iliac lymphadenopathy since prior study. 2. Increased sclerosis of several bone metastases in lumbar spine and  pelvis, consistent with interval healing/response to therapy. 3. Decreased size of prostate gland. 4. No new or progressive metastatic disease identified.   08/01/2019 Imaging   CT  Musculoskeletal: Sclerotic metastases in the spine and pelvis are unchanged. 1. Interval decrease in size of bilateral iliac chain lymph nodes. Stable osseous metastatic disease. 2.  Aortic atherosclerosis (ICD10-I70.0).   04/16/2021 PET scan   PSMA PET 1. Focal lesion just LEFT of midline within the LEFT lobe of the prostate gland concerning for local prostate cancer recurrence. 2. Intensely radiotracer avid small external and common iliac lymph nodes consistent with local nodal metastasis in the pelvis. 3. Progression skeletal metastasis with several new lesions in the calvarium, spine, pelvis and proximal femur.   02/26/2022 PET scan   PSMA PET 1. No significant interval change from PSMA PET scan 04/16/2021. 2. Persistent radiotracer avid lesion in the RIGHT lobe of the prostate gland. 3. Persistent intensely radiotracer avid small metastatic iliac lymph nodes. 4. Multifocal intense radiotracer active skeletal metastasis. No change in size or number skeletal lesions. Activity without significant change.   02/2022 Miscellaneous   Decreased Abi to 250 mg with low fat diet.   11/01/2022 Tumor Marker   PSA 3.9   05/05/2023 Cancer Staging   Staging form: Prostate, AJCC 8th Edition - Clinical: Stage IVB (cTX, cN1, pM1b, PSA: 542, Grade Group: 5) - Signed by Tina Pauletta BROCKS, MD on 05/05/2023 Prostate specific antigen (PSA) range: 20 or greater Histologic grading system: 5 grade system   04/2023 Tumor Marker   PSA nadir 3.7   05/25/2023 PET scan   PSMA PET 1. Similar distribution of eccentric left prostatic primary, pelvic nodal, and widespread osseous metastasis. The primary is similar in tracer affinity and the nodal metastasis are similar in size but decreased in tracer affinity. Osseous metastasis are  decreased in tracer affinity. 2. A tiny low left periaortic abdominal retroperitoneal node demonstrates new or increased mild tracer affinity. Cannot exclude disease progression at this site. 3. Incidental findings, including: Coronary artery atherosclerosis. Aortic Atherosclerosis (ICD10-I70.0).      PHYSICAL EXAMINATION: ECOG PERFORMANCE STATUS: {CHL ONC ECOG [phone]}  There were no vitals filed for this visit. There were no vitals filed for this visit.  GENERAL: alert, no distress and comfortable SKIN: skin color normal and no bruising or petechiae or jaundice on exposed skin EYES: normal, sclera clear OROPHARYNX: no exudate  NECK: No palpable mass LYMPH:  no palpable cervical, axillary lymphadenopathy  LUNGS: clear to auscultation and percussion with normal breathing effort HEART: regular rate & rhythm  ABDOMEN: abdomen soft, non-tender and nondistended. Musculoskeletal: no edema NEURO: no focal motor/sensory deficits  Relevant data reviewed during this visit included ***

## 2023-10-10 NOTE — Assessment & Plan Note (Addendum)
 Continue Zometa . Xgeva  from 2019-2024 Routine dental care Continue calcium  (1000-1200 mg daily from food and supplements) and vitamin D3 (1000 IU daily) prevent diabetes

## 2023-10-11 ENCOUNTER — Inpatient Hospital Stay: Attending: Hematology

## 2023-10-11 ENCOUNTER — Inpatient Hospital Stay (HOSPITAL_BASED_OUTPATIENT_CLINIC_OR_DEPARTMENT_OTHER)

## 2023-10-11 ENCOUNTER — Inpatient Hospital Stay

## 2023-10-11 VITALS — BP 125/71 | HR 69 | Temp 97.3°F | Resp 17 | Ht 70.0 in | Wt 174.6 lb

## 2023-10-11 DIAGNOSIS — C7951 Secondary malignant neoplasm of bone: Secondary | ICD-10-CM | POA: Diagnosis not present

## 2023-10-11 DIAGNOSIS — M818 Other osteoporosis without current pathological fracture: Secondary | ICD-10-CM | POA: Diagnosis not present

## 2023-10-11 DIAGNOSIS — C61 Malignant neoplasm of prostate: Secondary | ICD-10-CM | POA: Diagnosis not present

## 2023-10-11 DIAGNOSIS — T387X5A Adverse effect of androgens and anabolic congeners, initial encounter: Secondary | ICD-10-CM | POA: Diagnosis not present

## 2023-10-11 DIAGNOSIS — Z9189 Other specified personal risk factors, not elsewhere classified: Secondary | ICD-10-CM | POA: Diagnosis not present

## 2023-10-11 DIAGNOSIS — I10 Essential (primary) hypertension: Secondary | ICD-10-CM

## 2023-10-11 DIAGNOSIS — D649 Anemia, unspecified: Secondary | ICD-10-CM

## 2023-10-11 DIAGNOSIS — Z9079 Acquired absence of other genital organ(s): Secondary | ICD-10-CM | POA: Insufficient documentation

## 2023-10-11 DIAGNOSIS — G4700 Insomnia, unspecified: Secondary | ICD-10-CM | POA: Insufficient documentation

## 2023-10-11 LAB — SAMPLE TO BLOOD BANK

## 2023-10-11 LAB — CBC WITH DIFFERENTIAL (CANCER CENTER ONLY)
Abs Immature Granulocytes: 0.01 K/uL (ref 0.00–0.07)
Basophils Absolute: 0 K/uL (ref 0.0–0.1)
Basophils Relative: 1 %
Eosinophils Absolute: 0.2 K/uL (ref 0.0–0.5)
Eosinophils Relative: 3 %
HCT: 36.3 % — ABNORMAL LOW (ref 39.0–52.0)
Hemoglobin: 12.9 g/dL — ABNORMAL LOW (ref 13.0–17.0)
Immature Granulocytes: 0 %
Lymphocytes Relative: 21 %
Lymphs Abs: 1.3 K/uL (ref 0.7–4.0)
MCH: 31 pg (ref 26.0–34.0)
MCHC: 35.5 g/dL (ref 30.0–36.0)
MCV: 87.3 fL (ref 80.0–100.0)
Monocytes Absolute: 0.6 K/uL (ref 0.1–1.0)
Monocytes Relative: 10 %
Neutro Abs: 4 K/uL (ref 1.7–7.7)
Neutrophils Relative %: 65 %
Platelet Count: 222 K/uL (ref 150–400)
RBC: 4.16 MIL/uL — ABNORMAL LOW (ref 4.22–5.81)
RDW: 12.1 % (ref 11.5–15.5)
WBC Count: 6.1 K/uL (ref 4.0–10.5)
nRBC: 0 % (ref 0.0–0.2)

## 2023-10-11 LAB — CMP (CANCER CENTER ONLY)
ALT: 14 U/L (ref 0–44)
AST: 20 U/L (ref 15–41)
Albumin: 4.3 g/dL (ref 3.5–5.0)
Alkaline Phosphatase: 62 U/L (ref 38–126)
Anion gap: 8 (ref 5–15)
BUN: 21 mg/dL (ref 8–23)
CO2: 24 mmol/L (ref 22–32)
Calcium: 9.7 mg/dL (ref 8.9–10.3)
Chloride: 106 mmol/L (ref 98–111)
Creatinine: 0.85 mg/dL (ref 0.61–1.24)
GFR, Estimated: >60 mL/min (ref >60)
Glucose, Bld: 110 mg/dL — ABNORMAL HIGH (ref 70–99)
Potassium: 3.9 mmol/L (ref 3.5–5.1)
Sodium: 138 mmol/L (ref 135–145)
Total Bilirubin: 0.8 mg/dL (ref 0.0–1.2)
Total Protein: 6.9 g/dL (ref 6.5–8.1)

## 2023-10-11 LAB — FERRITIN: Ferritin: 52 ng/mL (ref 24–336)

## 2023-10-11 LAB — VITAMIN B12: Vitamin B-12: 586 pg/mL (ref 180–914)

## 2023-10-11 MED ORDER — ZOLPIDEM TARTRATE 5 MG PO TABS: 5.0000 mg | ORAL_TABLET | Freq: Every evening | ORAL | 0 refills | Status: DC | PRN

## 2023-10-11 NOTE — Assessment & Plan Note (Addendum)
 Refill Ambien  today. 5 mg as needed.

## 2023-10-12 LAB — TESTOSTERONE: Testosterone: <3 ng/dL — ABNORMAL LOW (ref 264–916)

## 2023-10-12 LAB — PROSTATE-SPECIFIC AG, SERUM (LABCORP): Prostate Specific Ag, Serum: 1.9 ng/mL (ref 0.0–4.0)

## 2023-10-12 NOTE — Written Directive (Cosign Needed)
  PLUVICTO   THERAPY   RADIOPHARMACEUTICAL: Lutetium 177 vipivotide tetraxetan (Pluvicto )     PRESCRIBED DOSE FOR ADMINISTRATION:  200 mCi   ROUTE OFADMINISTRATION:  IV   DIAGNOSIS:  prostate cancer, Prostate cancer    REFERRING PHYSICIAN:Chang, Pauletta BROCKS, MD    TREATMENT # 2   ADDITIONAL PHYSICIAN COMMENTS/NOTES:   AUTHORIZED USER SIGNATURE & TIME STAMP: Norleen GORMAN Boxer, MD   10/13/23    8:24 AM

## 2023-10-13 ENCOUNTER — Ambulatory Visit (HOSPITAL_COMMUNITY)
Admission: RE | Admit: 2023-10-13 | Discharge: 2023-10-13 | Disposition: A | Discharge status: Home / Self Care | Source: Ambulatory Visit

## 2023-10-13 ENCOUNTER — Ambulatory Visit: Payer: Self-pay

## 2023-10-13 DIAGNOSIS — C61 Malignant neoplasm of prostate: Secondary | ICD-10-CM | POA: Diagnosis not present

## 2023-10-13 DIAGNOSIS — C7951 Secondary malignant neoplasm of bone: Secondary | ICD-10-CM | POA: Diagnosis not present

## 2023-10-13 MED ORDER — SODIUM CHLORIDE 0.9 % IV BOLUS: 500.0000 mL | Freq: Once | INTRAVENOUS | Status: DC

## 2023-10-13 MED ORDER — SODIUM CHLORIDE 0.9 % IV BOLUS
1000.0000 mL | Freq: Once | INTRAVENOUS | Status: AC
  Administered 2023-10-13: 1000 mL via INTRAVENOUS

## 2023-10-13 NOTE — Telephone Encounter (Signed)
 Notified of message below

## 2023-10-13 NOTE — Telephone Encounter (Signed)
-----   Message from Pauletta JAYSON Chihuahua sent at 10/13/2023 10:11 AM EDT ----- Please let him know good news, PSA is coming down.  Thank you. ----- Message ----- From: Rebecka, Lab [facility] [place] Sent: 10/11/2023   8:34 AM EDT To: Pauletta JAYSON Chihuahua, MD

## 2023-10-13 NOTE — Progress Notes (Signed)
 Post pluvicto  vital signs pt. Tolerated second pluvicto  treatment well!

## 2023-10-13 NOTE — Progress Notes (Signed)
 Pre vitals for pluvicto 

## 2023-10-13 NOTE — Progress Notes (Signed)
 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  Initial flush of the intravenous catheter was performed was sterile saline. The dose syringe was connected to the catheter and the Lu-177 Pluvicto  administered over a 1 to 10 min infusion. Single 10 cc  lushes with normal saline follow the dose. No complications were noted. The entire IV tubing, venocatheter, stopcock and syringes was removed in total, placed in a disposal bag and sent for assay of the residual activity, which will be reported at a later time in our EMR by the physics staff. Pressure was applied to the venipuncture site, and a compression bandage placed. Patient monitored for 1 hour following infusion.   RADIOPHARMACEUTICALS: [Two hundred one] microcuries Lu-177 PLUVICTO   FINDINGS: Current Infusion: [2]  Planned Infusions: 6    Patient presented to nuclear medicine for treatment. The patient's most recent blood counts were reviewed and remains a good candidate to proceed with Lu-177 Pluvicto .     Patient reports 1 day of the  several days after therapy.  No nausea.     No myelosuppression or renal toxicity.     PSA decreased to 1.9 from 4.4     The patient was situated in an infusion suite with a contact barrier placed under the arm. Intravenous access was established, using sterile technique, and a normal saline infusion from a syringe was started.     Micro-dosimetry: The prescribed radiation activity was assayed and confirmed to be within specified tolerance.  IMPRESSION: Current Infusion: [2]  Planned Infusions: 6    [The patient tolerated the infusion well. The  patient will return in 6 weeks for ongoing care.]

## 2023-10-20 ENCOUNTER — Ambulatory Visit (HOSPITAL_COMMUNITY)
Admission: RE | Admit: 2023-10-20 | Discharge: 2023-10-20 | Disposition: A | Discharge status: Home / Self Care | Payer: Medicare Other | Source: Ambulatory Visit | Attending: Cardiology | Admitting: Cardiology

## 2023-10-20 DIAGNOSIS — I10 Essential (primary) hypertension: Secondary | ICD-10-CM

## 2023-10-20 DIAGNOSIS — I77810 Thoracic aortic ectasia: Secondary | ICD-10-CM | POA: Insufficient documentation

## 2023-10-20 DIAGNOSIS — I119 Hypertensive heart disease without heart failure: Secondary | ICD-10-CM | POA: Insufficient documentation

## 2023-10-20 LAB — ECHOCARDIOGRAM COMPLETE
Area-P 1/2: 1.64 cm2
S' Lateral: 2.3 cm

## 2023-10-21 ENCOUNTER — Ambulatory Visit: Payer: Self-pay | Admitting: Cardiovascular Disease

## 2023-10-26 ENCOUNTER — Other Ambulatory Visit: Payer: Self-pay | Admitting: Hematology

## 2023-10-26 ENCOUNTER — Other Ambulatory Visit: Payer: Self-pay

## 2023-10-26 DIAGNOSIS — C61 Malignant neoplasm of prostate: Secondary | ICD-10-CM

## 2023-10-27 ENCOUNTER — Encounter: Payer: Self-pay | Admitting: Hematology

## 2023-11-15 ENCOUNTER — Ambulatory Visit (INDEPENDENT_AMBULATORY_CARE_PROVIDER_SITE_OTHER): Payer: Medicare Other

## 2023-11-15 VITALS — Ht 70.0 in | Wt 174.0 lb

## 2023-11-15 DIAGNOSIS — Z0000 Encounter for general adult medical examination without abnormal findings: Secondary | ICD-10-CM

## 2023-11-15 DIAGNOSIS — Z1159 Encounter for screening for other viral diseases: Secondary | ICD-10-CM

## 2023-11-15 NOTE — Progress Notes (Signed)
 Subjective:  Please attest and cosign this visit due to patients primary care provider not being in the office at the time the visit was completed.  (Pt of Lauraine Pereyra, NP)   Ethan [name] is a 73 y.o. who presents for a Medicare Wellness preventive visit.  As a reminder, Annual Wellness Visits don't include a physical exam, and some assessments may be limited, especially if this visit is performed virtually. We may recommend an in-person follow-up visit with your provider if needed.  Visit Complete: Virtual I connected with  Ethan [name] on 11/15/23 by a audio enabled telemedicine application and verified that I am speaking with the correct person using two identifiers.  Patient Location: Home  Provider Location: Office/Clinic  I discussed the limitations of evaluation and management by telemedicine. The patient expressed understanding and agreed to proceed.  Vital Signs: Because this visit was a virtual/telehealth visit, some criteria may be missing or patient reported. Any vitals not documented were not able to be obtained and vitals that have been documented are patient reported.  VideoDeclined- This patient declined Librarian, academic. Therefore the visit was completed with audio only.  Persons Participating in Visit: Patient.  AWV Questionnaire: No: Patient Medicare AWV questionnaire was not completed prior to this visit.  Cardiac Risk Factors include: advanced age (>63men, >43 women);dyslipidemia;hypertension;male gender     Objective:    Today's Vitals   11/15/23 1055  Weight: 174 lb (78.9 kg)  Height: 5' 10 (1.778 m)   Body mass index is 24.97 kg/m.     11/15/2023   10:55 AM 11/10/2022   11:05 AM 08/28/2020    1:03 PM 10/04/2019    2:39 PM 08/02/2019   10:14 AM 05/31/2019   12:54 PM 03/29/2019    1:14 PM  Advanced Directives  Does Patient Have a Medical Advance Directive? Yes Yes Yes Yes Yes Yes Yes  Type of Special educational needs teacher of [place];Living will Healthcare Power of [place];Living will Healthcare Power of [place];Living will      Does patient want to make changes to medical advance directive? No - Patient declined   No - Patient declined No - Patient declined    Copy of Healthcare Power of Attorney in Chart? Yes - validated most recent copy scanned in chart (See row information) No - copy requested No - copy requested        Current Medications (verified) Outpatient Encounter Medications as of 11/15/2023  Medication Sig   Calcium  Carb-Cholecalciferol  (CALTRATE 600+D3 PO) Take 2 tablets by mouth daily.   Multiple Vitamin (MULTIVITAMIN) tablet Take 1 tablet by mouth daily.   ondansetron  (ZOFRAN ) 4 MG tablet Take 1 tablet (4 mg total) by mouth every 8 (eight) hours as needed for nausea or vomiting.   potassium chloride  SA (KLOR-CON  M) 20 MEQ tablet TAKE 1 TABLET BY MOUTH EVERY DAY   promethazine -dextromethorphan (PROMETHAZINE -DM) 6.25-15 MG/5ML syrup Take 5 mLs by mouth 3 (three) times daily as needed for cough.   rosuvastatin  (CRESTOR ) 10 MG tablet Take 1 tablet (10 mg total) by mouth daily.   tamsulosin  (FLOMAX ) 0.4 MG CAPS capsule TAKE 1 CAPSULE BY MOUTH EVERY DAY AFTER SUPPER   valsartan  (DIOVAN ) 80 MG tablet Take 1 tablet (80 mg total) by mouth daily.   zolpidem  (AMBIEN ) 5 MG tablet Take 1 tablet (5 mg total) by mouth at bedtime as needed for sleep.   [DISCONTINUED] denosumab  (XGEVA ) 120 MG/1.7ML SOLN injection 1.7 ml   Facility-Administered Encounter Medications as of  11/15/2023  Medication   betamethasone  acetate-betamethasone  sodium phosphate  (CELESTONE ) injection 6 mg    Allergies (verified) Patient has no known allergies.   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    Prostate cancer metastatic to multiple sites [facility]) urologist-  dr watt   dx 08-25-2017--  Gleason 9, PSA 542,  vol 67cc--  METs to bone  (multiple sites) and lymph nodes   Wears glasses    Past Surgical History:  Procedure Laterality Date   APPENDECTOMY  1963   COLONOSCOPY WITH PROPOFOL   2017   ORCHIECTOMY Bilateral 09/08/2017   Procedure: ORCHIECTOMY;  Surgeon: watt Rush, MD;  Location: [facility];  Service: Urology;  Laterality: Bilateral;   ORIF HUMERUS FRACTURE Left 05/12/2018   Procedure: OPEN REDUCTION INTERNAL FIXATION (ORIF) LEFT PROXIMAL HUMERUS FRACTURE;  Surgeon: Celena Sharper, MD;  Location: [facility] OR;  Service: Orthopedics;  Laterality: Left;   Family History  Problem Relation Age of Onset   Dementia Mother    Kidney failure Father    Alcoholism Father    Congestive Heart Failure Brother    Lung cancer Maternal Grandmother    Heart attack Paternal Grandfather    Lung cancer Maternal Aunt    Lung cancer Maternal Uncle    Lung cancer Paternal Uncle    Social History   Socioeconomic History   Marital status: Married    Spouse name: Not on file   Number of children: 2   Years of education: Not on file   Highest education level: Bachelor's degree (e.g., BA, AB, BS)  Occupational History   Occupation: Theme park manager [employer]  Tobacco Use   Smoking status: Former    Current packs/day: 0.00    Types: Cigarettes    Start date: 09/08/1986    Quit date: 09/08/2006    Years since quitting: 17.1   Smokeless tobacco: Never  Vaping Use   Vaping status: Never Used  Substance and Sexual Activity   Alcohol use: Not Currently    Alcohol/week: 1.0 standard drink of alcohol    Types: 1 Cans of beer per week    Comment: 1 drink per day   Drug use: Never   Sexual activity: Yes    Partners: Female  Other Topics Concern   Not on file  Social History Narrative   Not on file   Social Drivers of Health   Financial Resource Strain: Low Risk  (11/15/2023)   Overall Financial Resource Strain (CARDIA)    Difficulty of Paying Living Expenses: Not hard at all  Food Insecurity:  No Food Insecurity (11/15/2023)   Hunger Vital Sign    Worried About Running Out of Food in the Last Year: Never true    Ran Out of Food in the Last Year: Never true  Transportation Needs: No Transportation Needs (11/15/2023)   PRAPARE - Administrator, Civil Service (Medical): No    Lack of Transportation (Non-Medical): No  Physical Activity: Sufficiently Active (11/15/2023)   Exercise Vital Sign    Days of Exercise per Week: 6 days    Minutes of Exercise per Session: 90 min  Stress: No Stress Concern Present (11/15/2023)   [employer] of Occupational Health - Occupational Stress Questionnaire    Feeling of Stress: Not at all  Social Connections: Socially Integrated (11/15/2023)   Social Connection and Isolation Panel    Frequency of Communication with Friends and Family:  More than three times a week    Frequency of Social Gatherings with Friends and Family: Twice a week    Attends Religious Services: More than 4 times per year    Active Member of [employer] or Organizations: Yes    Attends Engineer, structural: More than 4 times per year    Marital Status: Married    Tobacco Counseling Counseling given: Not Answered    Clinical Intake:  Pre-visit preparation completed: Yes  Pain : No/denies pain     BMI - recorded: 24.97 Nutritional Status: BMI of 19-24  Normal Nutritional Risks: None Diabetes: No  Lab Results  Component Value Date   HGBA1C 6.2 09/07/2022     How often do you need to have someone help you when you read instructions, pamphlets, or other written materials from your doctor or pharmacy?: 1 - Never  Interpreter Needed?: No  Information entered by :: Verdie Saba, CMA   Activities of Daily Living     11/15/2023   10:59 AM  In your present state of health, do you have any difficulty performing the following activities:  Hearing? 0  Vision? 0  Difficulty concentrating or making decisions? 0  Walking or climbing stairs? 0  Dressing  or bathing? 0  Doing errands, shopping? 0  Preparing Food and eating ? N  Using the Toilet? N  In the past six months, have you accidently leaked urine? Y  Comment wears a pad  Do you have problems with loss of bowel control? N  Managing your Medications? N  Managing your Finances? N  Housekeeping or managing your Housekeeping? N    Patient Care Team: Elnor Lauraine BRAVO, NP as PCP - General (Nurse Practitioner) Barbaraann, Darryle Ned, MD as PCP - Cardiology (Cardiology) Tina Pauletta BROCKS, MD as Consulting Physician (Hematology and Oncology) [place], Ritesh, OD (Optometry)  I have updated your Care Teams any recent Medical Services you may have received from other providers in the past year.     Assessment:   This is a routine wellness examination for Ethan [name].  Hearing/Vision screen Hearing Screening - Comments:: Denies hearing difficulties   Vision Screening - Comments:: Wears rx glasses - up to date with routine eye exams with [facility]   Goals Addressed               This Visit's Progress     Patient Stated (pt-stated)        Patient stated he has just celebrated a birthday in August and plans to continue exercising       Depression Screen     11/15/2023   11:00 AM 10/11/2023    8:38 AM 11/10/2022   11:06 AM 09/07/2022   10:00 AM 09/18/2020    1:24 PM  PHQ 2/9 Scores  PHQ - 2 Score 0 0 0 0 0  PHQ- 9 Score 0  0 0     Fall Risk     11/15/2023   10:59 AM 11/10/2022   11:13 AM 11/09/2022    9:45 AM  Fall Risk   Falls in the past year? 0 0 0  Number falls in past yr: 0 0 0  Injury with Fall? 0 0   Risk for fall due to : No Fall Risks No Fall Risks   Follow up Falls evaluation completed;Falls prevention discussed Falls prevention discussed     MEDICARE RISK AT HOME:  Medicare Risk at Home Any stairs in or around the home?: No (inside/outside) If so, are  there any without handrails?: No Home free of loose throw rugs in walkways, pet beds, electrical cords, etc?:  Yes Adequate lighting in your home to reduce risk of falls?: Yes Life alert?: No Use of a cane, walker or w/c?: No Grab bars in the bathroom?: Yes Shower chair or bench in shower?: Yes Elevated toilet seat or a handicapped toilet?: Yes  TIMED UP AND GO:  Was the test performed?  No  Cognitive Function: 6CIT completed        11/15/2023   11:02 AM 11/10/2022   11:14 AM  6CIT Screen  What Year? 0 points 0 points  What month? 0 points 0 points  What time? 0 points 0 points  Count back from 20 0 points 0 points  Months in reverse 0 points 0 points  Repeat phrase 0 points 0 points  Total Score 0 points 0 points    Immunizations Immunization History  Administered Date(s) Administered   Fluad Quad(high Dose 65+) 02/09/2022   Fluad Trivalent(High Dose 65+) 12/28/2022   Fluzone Influenza virus vaccine,trivalent (IIV3), split virus 12/06/2018   INFLUENZA, HIGH DOSE SEASONAL PF 12/26/2017, 12/06/2018   Influenza Split 12/20/2011   Influenza,inj,Quad PF,6+ Mos 12/23/2016   Influenza-Unspecified 02/09/2022   PFIZER(Purple Top)SARS-COV-2 Vaccination 04/05/2019, 04/24/2019, 11/09/2019, 12/04/2020   PNEUMOCOCCAL CONJUGATE-20 07/14/2023   Pfizer Covid-19 Vaccine Bivalent Booster 5yrs & up 12/04/2020   Pfizer(Comirnaty)Fall Seasonal Vaccine 12 years and older 02/09/2022, 12/28/2022   Pneumococcal Conjugate-13 12/26/2017   Td 06/30/2023   Tdap 10/21/2011   Zoster Recombinant(Shingrix) 12/23/2016   Zoster, Live 10/21/2011, 08/25/2016, 12/23/2016    Screening Tests Health Maintenance  Topic Date Due   Hepatitis C Screening  Never done   Zoster Vaccines- Shingrix (2 of 2) 02/17/2017   INFLUENZA VACCINE  10/14/2023   COVID-19 Vaccine (7 - Pfizer risk 2024-25 season) 11/14/2023   Medicare Annual Wellness (AWV)  11/14/2024   Fecal DNA (Cologuard)  09/21/2025   DTaP/Tdap/Td (3 - Td or Tdap) 06/29/2033   Pneumococcal Vaccine: 50+ Years  Completed   HPV VACCINES  Aged Out    Meningococcal B Vaccine  Aged Out   Colonoscopy  Discontinued    Health Maintenance  Health Maintenance Due  Topic Date Due   Hepatitis C Screening  Never done   Zoster Vaccines- Shingrix (2 of 2) 02/17/2017   INFLUENZA VACCINE  10/14/2023   COVID-19 Vaccine (7 - Pfizer risk 2024-25 season) 11/14/2023   Health Maintenance Items Addressed: Hepatitis C Screening   Additional Screening:  Vision Screening: Recommended annual ophthalmology exams for early detection of glaucoma and other disorders of the eye. Would you like a referral to an eye doctor? No    Dental Screening: Recommended annual dental exams for proper oral hygiene  Community Resource Referral / Chronic Care Management: CRR required this visit?  No   CCM required this visit?  No   Plan:    I have personally reviewed and noted the following in the patient's chart:   Medical and social history Use of alcohol, tobacco or illicit drugs  Current medications and supplements including opioid prescriptions. Patient is not currently taking opioid prescriptions. Functional ability and status Nutritional status Physical activity Advanced directives List of other physicians Hospitalizations, surgeries, and ER visits in previous 12 months Vitals Screenings to include cognitive, depression, and falls Referrals and appointments  In addition, I have reviewed and discussed with patient certain preventive protocols, quality metrics, and best practice recommendations. A written personalized care plan for preventive services as  well as general preventive health recommendations were provided to patient.   Verdie CHRISTELLA Saba, CMA   11/15/2023   After Visit Summary: (MyChart) Due to this being a telephonic visit, the after visit summary with patients personalized plan was offered to patient via MyChart   Notes: Nothing significant to report at this time.

## 2023-11-15 NOTE — Patient Instructions (Addendum)
 Ethan [name] , Thank you for taking time out of your busy schedule to complete your Annual Wellness Visit with me. I enjoyed our conversation and look forward to speaking with you again next year. I, as well as your care team,  appreciate your ongoing commitment to your health goals. Please review the following plan we discussed and let me know if I can assist you in the future. Your Game plan/ To Do List    Referrals: If you haven't heard from the office you've been referred to, please reach out to them at the phone provided.   Follow up Visits: We will see or speak with you next year for your Next Medicare AWV with our clinical staff Have you seen your provider in the last 6 months (3 months if uncontrolled diabetes)? No  Clinician Recommendations:  Aim for 30 minutes of exercise or brisk walking, 6-8 glasses of water, and 5 servings of fruits and vegetables each day. Educated and advised on getting the 2nd Shingles and Influenza vaccines in 2025.      This is a list of the screenings recommended for you:  Health Maintenance  Topic Date Due   Hepatitis C Screening  Never done   Zoster (Shingles) Vaccine (2 of 2) 02/17/2017   Flu Shot  10/14/2023   COVID-19 Vaccine (7 - Pfizer risk 2024-25 season) 11/14/2023   Medicare Annual Wellness Visit  11/14/2024   Cologuard (Stool DNA test)  09/21/2025   DTaP/Tdap/Td vaccine (3 - Td or Tdap) 06/29/2033   Pneumococcal Vaccine for age over 74  Completed   HPV Vaccine  Aged Out   Meningitis B Vaccine  Aged Out   Colon Cancer Screening  Discontinued    Advanced directives: (In Chart) A copy of your advanced directives are scanned into your chart should your provider ever need it. Advance Care Planning is important because it:  [x]  Makes sure you receive the medical care that is consistent with your values, goals, and preferences  [x]  It provides guidance to your family and loved ones and reduces their decisional burden about whether or not they  are making the right decisions based on your wishes.  Follow the link provided in your after visit summary or read over the paperwork we have mailed to you to help you started getting your Advance Directives in place. If you need assistance in completing these, please reach out to us  so that we can help you!

## 2023-11-21 ENCOUNTER — Inpatient Hospital Stay

## 2023-11-21 ENCOUNTER — Inpatient Hospital Stay: Attending: Hematology

## 2023-11-21 VITALS — BP 151/77 | HR 78 | Temp 97.3°F | Resp 14 | Wt 172.9 lb

## 2023-11-21 DIAGNOSIS — C7951 Secondary malignant neoplasm of bone: Secondary | ICD-10-CM | POA: Diagnosis not present

## 2023-11-21 DIAGNOSIS — Z9079 Acquired absence of other genital organ(s): Secondary | ICD-10-CM | POA: Diagnosis not present

## 2023-11-21 DIAGNOSIS — Z923 Personal history of irradiation: Secondary | ICD-10-CM | POA: Diagnosis not present

## 2023-11-21 DIAGNOSIS — T387X5A Adverse effect of androgens and anabolic congeners, initial encounter: Secondary | ICD-10-CM | POA: Diagnosis not present

## 2023-11-21 DIAGNOSIS — Z9221 Personal history of antineoplastic chemotherapy: Secondary | ICD-10-CM | POA: Diagnosis not present

## 2023-11-21 DIAGNOSIS — C61 Malignant neoplasm of prostate: Secondary | ICD-10-CM

## 2023-11-21 DIAGNOSIS — M818 Other osteoporosis without current pathological fracture: Secondary | ICD-10-CM | POA: Diagnosis not present

## 2023-11-21 DIAGNOSIS — C779 Secondary and unspecified malignant neoplasm of lymph node, unspecified: Secondary | ICD-10-CM | POA: Diagnosis not present

## 2023-11-21 LAB — CMP (CANCER CENTER ONLY)
ALT: 15 U/L (ref 0–44)
AST: 19 U/L (ref 15–41)
Albumin: 4.6 g/dL (ref 3.5–5.0)
Alkaline Phosphatase: 49 U/L (ref 38–126)
Anion gap: 9 (ref 5–15)
BUN: 18 mg/dL (ref 8–23)
CO2: 26 mmol/L (ref 22–32)
Calcium: 10.2 mg/dL (ref 8.9–10.3)
Chloride: 106 mmol/L (ref 98–111)
Creatinine: 0.82 mg/dL (ref 0.61–1.24)
GFR, Estimated: >60 mL/min (ref >60)
Glucose, Bld: 120 mg/dL — ABNORMAL HIGH (ref 70–99)
Potassium: 4.1 mmol/L (ref 3.5–5.1)
Sodium: 141 mmol/L (ref 135–145)
Total Bilirubin: 0.8 mg/dL (ref 0.0–1.2)
Total Protein: 7 g/dL (ref 6.5–8.1)

## 2023-11-21 LAB — CBC WITH DIFFERENTIAL (CANCER CENTER ONLY)
Abs Immature Granulocytes: 0.01 K/uL (ref 0.00–0.07)
Basophils Absolute: 0 K/uL (ref 0.0–0.1)
Basophils Relative: 1 %
Eosinophils Absolute: 0.3 K/uL (ref 0.0–0.5)
Eosinophils Relative: 4 %
HCT: 38.6 % — ABNORMAL LOW (ref 39.0–52.0)
Hemoglobin: 13.5 g/dL (ref 13.0–17.0)
Immature Granulocytes: 0 %
Lymphocytes Relative: 22 %
Lymphs Abs: 1.3 K/uL (ref 0.7–4.0)
MCH: 31 pg (ref 26.0–34.0)
MCHC: 35 g/dL (ref 30.0–36.0)
MCV: 88.7 fL (ref 80.0–100.0)
Monocytes Absolute: 0.6 K/uL (ref 0.1–1.0)
Monocytes Relative: 10 %
Neutro Abs: 3.8 K/uL (ref 1.7–7.7)
Neutrophils Relative %: 63 %
Platelet Count: 220 K/uL (ref 150–400)
RBC: 4.35 MIL/uL (ref 4.22–5.81)
RDW: 12.1 % (ref 11.5–15.5)
WBC Count: 6 K/uL (ref 4.0–10.5)
nRBC: 0 % (ref 0.0–0.2)

## 2023-11-21 MED ORDER — ZOLEDRONIC ACID 4 MG/100ML IV SOLN
4.0000 mg | Freq: Once | INTRAVENOUS | Status: AC
Start: 2023-11-21 — End: 2023-11-21
  Administered 2023-11-21: 4 mg via INTRAVENOUS
  Filled 2023-11-21: qty 100

## 2023-11-21 MED ORDER — SODIUM CHLORIDE 0.9 % IV SOLN: INTRAVENOUS | Status: DC

## 2023-11-21 NOTE — Progress Notes (Signed)
 [facility] OFFICE PROGRESS NOTE  Patient Care Team: Elnor Lauraine BRAVO, NP as PCP - General (Nurse Practitioner) Barbaraann, Darryle Ned, MD as PCP - Cardiology (Cardiology) Tina Pauletta BROCKS, MD as Consulting Physician (Hematology and Oncology) McMillin, Ritesh, OD (Optometry)  Ethan [name] is a 73 y.o.male with history of hypertension, bilateral [facility]. 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 He then had bilateral orchiectomy by Dr. Watt on 09/08/2017. He then started AAP in 09/2017. PSA nadir 3.7 in 04/2023.   Current diagnosis: mCRPC Initial diagnosis: mHSPC Germline testing: Negative genetic testing on the multii-cancer panel.  Somatic testing: Guardant360 from 2023. TMB 8.74. MSI high not detected.  No reportable tumor related somatic alteration. Previous Treatment: 09/2017-08/2023 AAP. PSA nadir 3.7 in 04/2023. Post bilateral ochiectomy Current treatment: 09/01/2023 - current Pluvicto   Pretreatment PET: Multifocal tracer avid osseous metastasis & eccentric left prostatic primary,  nodal metastasis. (05/25/23). PSA 4.4  Clinical tolerating Pluvicto  well.  Will proceed with cycle 3 next week.  Continue to monitor PSA.  Will receive Zometa  today. Assessment & Plan Prostate cancer (HCC) Continue C3 Pluvicto .   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  Will receive Zometa  today. If cannot tolerate Zometa , will switch to Xgeva . Xgeva  from 2019-2024 Routine dental care Continue calcium  (1000-1200 mg daily from food and supplements) and vitamin D3 (1000 IU daily) prevent diabetes  Follow-up ~10/22 with lab.  Patient would like earlier appointment.  Orders Placed This Encounter  Procedures   CBC with Differential (Cancer Center Only)    Standing Status:   Future    Expiration Date:   11/20/2024   CMP  (Cancer Center only)    Standing Status:   Future    Expiration Date:   11/20/2024   Prostate-Specific AG, Serum    Standing Status:   Future    Expiration Date:   11/20/2024   Testosterone     Standing Status:   Future    Expiration Date:   11/20/2024     Pauletta BROCKS Tina, MD  INTERVAL HISTORY: Patient returns for follow-up.  Overall feeling well.  He is active, mowing lawn, no physical limitation.  Exercises hard sometimes.  No active chest pain, new back pain or bone pain.  Oncology History Overview Note  Negative genetic testing   Prostate cancer (HCC)  08/24/2017 Tumor Marker   PSA 352   08/31/2017 Surgery   Prostate biopsy showed high volume high-grade disease with a Gleason score 4+5 = 9 and multiple cores with a total of at least 11 out of 12 cores involved with cancer.    09/02/2017 Imaging   Bone scan  Multiple sites of abnormal increased tracer localization including midthoracic spine at T5, at LEFT L3, lateral RIGHT fourth rib, LEFT ischium, LEFT inferior pubic ramus and pubic body, and LEFT femoral neck consistent with osseous metastases.   09/08/2017 Surgery   orchiectomy   09/2017 -  Chemotherapy   Started Zytiga  1000 mg daily with prednisone   Also started Xgeva    10/27/2017 Initial Diagnosis   Prostate cancer (HCC)   12/15/2017 Tumor Marker   PSA 18.2   12/26/2017 Genetic Testing   Negative genetic testing on the multii-cancer panel.  The Multi-Gene Panel offered by [facility] includes sequencing and/or deletion duplication testing of the following 84 genes: AIP, ALK, APC, ATM, AXIN2,BAP1,  BARD1, BLM, BMPR1A, BRCA1, BRCA2, BRIP1, CASR, CDC73, CDH1, CDK4, CDKN1B, CDKN1C, CDKN2A (p14ARF), CDKN2A (p16INK4a), CEBPA, CHEK2, CTNNA1, DICER1, DIS3L2, EGFR (c.2369C>T, p.Thr790Met variant only), EPCAM (Deletion/duplication testing only), FH, FLCN, GATA2, GPC3, GREM1 (Promoter region deletion/duplication testing only), HOXB13 (c.251G>A, p.Gly84Glu), HRAS, KIT, MAX, MEN1, MET, MITF  (c.952G>A, p.Glu318Lys variant only), MLH1, MSH2, MSH3, MSH6, MUTYH, NBN, NF1, NF2, NTHL1, PALB2, PDGFRA, PHOX2B, PMS2, POLD1, POLE, POT1, PRKAR1A, PTCH1, PTEN, RAD50, RAD51C, RAD51D, RB1, RECQL4, RET, RUNX1, SDHAF2, SDHA (sequence changes only), SDHB, SDHC, SDHD, SMAD4, SMARCA4, SMARCB1, SMARCE1, STK11, SUFU, TERC, TERT, TMEM127, TP53, TSC1, TSC2, VHL, WRN and WT1.  The report date is December 26, 2017.   06/28/2018 Imaging   CT AP 1. Decreased bilateral iliac lymphadenopathy since prior study. 2. Increased sclerosis of several bone metastases in lumbar spine and pelvis, consistent with interval healing/response to therapy. 3. Decreased size of prostate gland. 4. No new or progressive metastatic disease identified.   08/01/2019 Imaging   CT  Musculoskeletal: Sclerotic metastases in the spine and pelvis are unchanged. 1. Interval decrease in size of bilateral iliac chain lymph nodes. Stable osseous metastatic disease. 2.  Aortic atherosclerosis (ICD10-I70.0).   04/16/2021 PET scan   PSMA PET 1. Focal lesion just LEFT of midline within the LEFT lobe of the prostate gland concerning for local prostate cancer recurrence. 2. Intensely radiotracer avid small external and common iliac lymph nodes consistent with local nodal metastasis in the pelvis. 3. Progression skeletal metastasis with several new lesions in the calvarium, spine, pelvis and proximal femur.   02/26/2022 PET scan   PSMA PET 1. No significant interval change from PSMA PET scan 04/16/2021. 2. Persistent radiotracer avid lesion in the RIGHT lobe of the prostate gland. 3. Persistent intensely radiotracer avid small metastatic iliac lymph nodes. 4. Multifocal intense radiotracer active skeletal metastasis. No change in size or number skeletal lesions. Activity without significant change.   02/2022 Miscellaneous   Decreased Abi to 250 mg with low fat diet.   11/01/2022 Tumor Marker   PSA 3.9   05/05/2023 Cancer Staging   Staging  form: Prostate, AJCC 8th Edition - Clinical: Stage IVB (cTX, cN1, pM1b, PSA: 542, Grade Group: 5) - Signed by Tina Pauletta BROCKS, MD on 05/05/2023 Prostate specific antigen (PSA) range: 20 or greater Histologic grading system: 5 grade system   04/2023 Tumor Marker   PSA nadir 3.7   05/25/2023 PET scan   PSMA PET 1. Similar distribution of eccentric left prostatic primary, pelvic nodal, and widespread osseous metastasis. The primary is similar in tracer affinity and the nodal metastasis are similar in size but decreased in tracer affinity. Osseous metastasis are decreased in tracer affinity. 2. A tiny low left periaortic abdominal retroperitoneal node demonstrates new or increased mild tracer affinity. Cannot exclude disease progression at this site. 3. Incidental findings, including: Coronary artery atherosclerosis. Aortic Atherosclerosis (ICD10-I70.0).      PHYSICAL EXAMINATION: ECOG PERFORMANCE STATUS: 0 - Asymptomatic  Vitals:   11/21/23 0826  BP: (!) 151/77  Pulse: 78  Resp: 14  Temp: (!) 97.3 F (36.3 C)  SpO2: 98%   Filed Weights   11/21/23 0826  Weight: 172 lb 14.4 oz (78.4 kg)    GENERAL: alert, no distress and comfortable SKIN: skin color normal and no jaundice LUNGS: clear to auscultation and percussion with normal breathing effort HEART: regular rate & rhythm  Musculoskeletal: no edema  Relevant data reviewed during this visit included labs.  New labs ordered.

## 2023-11-21 NOTE — Patient Instructions (Signed)
 [facility] - A DEPT OF [facility]  Discharge Instructions: Thank you for choosing [facility] to provide your oncology and hematology care.   If you have a lab appointment with the Cancer Center, please go directly to the Cancer Center and check in at the registration area.   Wear comfortable clothing and clothing appropriate for easy access to any Portacath or PICC line.   We strive to give you quality time with your provider. You may need to reschedule your appointment if you arrive late (15 or more minutes).  Arriving late affects you and other patients whose appointments are after yours.  Also, if you miss three or more appointments without notifying the office, you may be dismissed from the clinic at the provider's discretion.      For prescription refill requests, have your pharmacy contact our office and allow 72 hours for refills to be completed.    Today you received the following chemotherapy and/or immunotherapy agents: Zoledronic  Acid (Zometa )   To help prevent nausea and vomiting after your treatment, we encourage you to take your nausea medication as directed.  BELOW ARE SYMPTOMS THAT SHOULD BE REPORTED IMMEDIATELY: *FEVER GREATER THAN 100.4 F (38 C) OR HIGHER *CHILLS OR SWEATING *NAUSEA AND VOMITING THAT IS NOT CONTROLLED WITH YOUR NAUSEA MEDICATION *UNUSUAL SHORTNESS OF BREATH *UNUSUAL BRUISING OR BLEEDING *URINARY PROBLEMS (pain or burning when urinating, or frequent urination) *BOWEL PROBLEMS (unusual diarrhea, constipation, pain near the anus) TENDERNESS IN MOUTH AND THROAT WITH OR WITHOUT PRESENCE OF ULCERS (sore throat, sores in mouth, or a toothache) UNUSUAL RASH, SWELLING OR PAIN  UNUSUAL VAGINAL DISCHARGE OR ITCHING   Items with * indicate a potential emergency and should be followed up as soon as possible or go to the Emergency Department if any problems should occur.  Please show the CHEMOTHERAPY ALERT CARD or  IMMUNOTHERAPY ALERT CARD at check-in to the Emergency Department and triage nurse.  Should you have questions after your visit or need to cancel or reschedule your appointment, please contact [facility] - A DEPT OF [facility]  Dept: [phone]  and follow the prompts.  Office hours are 8:00 a.m. to 4:30 p.m. Monday - Friday. Please note that voicemails left after 4:00 p.m. may not be returned until the following business day.  We are closed weekends and major holidays. You have access to a nurse at all times for urgent questions. Please call the main number to the clinic Dept: [phone] and follow the prompts.   For any non-urgent questions, you may also contact your provider using MyChart. We now offer e-Visits for anyone 40 and older to request care online for non-urgent symptoms. For details visit [email or web address].   Also download the MyChart app! Go to the app store, search MyChart, open the app, select [place], and log in with your MyChart username and password.

## 2023-11-21 NOTE — Assessment & Plan Note (Addendum)
 Continue Zometa .  Will receive Zometa  today. If cannot tolerate Zometa , will switch to Xgeva . Xgeva  from 2019-2024 Routine dental care Continue calcium  (1000-1200 mg daily from food and supplements) and vitamin D3 (1000 IU daily) prevent diabetes

## 2023-11-21 NOTE — Assessment & Plan Note (Addendum)
 Continue C3 Pluvicto .   History of bilateral orchiectomy Lab and visit about 1 week before next cycle

## 2023-11-22 LAB — PROSTATE-SPECIFIC AG, SERUM (LABCORP): Prostate Specific Ag, Serum: 1.1 ng/mL (ref 0.0–4.0)

## 2023-11-22 LAB — TESTOSTERONE: Testosterone: <3 ng/dL — ABNORMAL LOW (ref 264–916)

## 2023-11-24 ENCOUNTER — Other Ambulatory Visit (HOSPITAL_COMMUNITY)

## 2023-11-28 NOTE — Written Directive (Cosign Needed)
  PLUVICTO   THERAPY   RADIOPHARMACEUTICAL: Lutetium 177 vipivotide tetraxetan (Pluvicto )     PRESCRIBED DOSE FOR ADMINISTRATION:  200 mCi   ROUTE OFADMINISTRATION:  IV   DIAGNOSIS:  Prostate Cancer   REFERRING PHYSICIAN: Tina Pauletta BROCKS, MD   TREATMENT #: [id number]   ADDITIONAL PHYSICIAN COMMENTS/NOTES:   AUTHORIZED USER SIGNATURE & TIME STAMP: Norleen GORMAN Boxer, MD   11/29/23    1:55 PM

## 2023-11-29 ENCOUNTER — Ambulatory Visit (HOSPITAL_COMMUNITY)
Admission: RE | Admit: 2023-11-29 | Discharge: 2023-11-29 | Disposition: A | Discharge status: Home / Self Care | Source: Ambulatory Visit

## 2023-11-29 DIAGNOSIS — C61 Malignant neoplasm of prostate: Secondary | ICD-10-CM | POA: Diagnosis present

## 2023-11-29 DIAGNOSIS — C7951 Secondary malignant neoplasm of bone: Secondary | ICD-10-CM | POA: Diagnosis not present

## 2023-11-29 MED ORDER — SODIUM CHLORIDE 0.9 % IV BOLUS
1000.0000 mL | Freq: Once | INTRAVENOUS | Status: AC
  Administered 2023-11-29: 1000 mL via INTRAVENOUS

## 2023-11-29 MED ORDER — LUTETIUM LU 177 VIPIVOTIDE TET 1000 MBQ/ML IV SOLN
210.2000 | Freq: Once | INTRAVENOUS | Status: AC
Start: 2023-11-29 — End: 2023-11-29
  Administered 2023-11-29: 210.2 via INTRAVENOUS

## 2023-11-29 NOTE — Progress Notes (Addendum)
 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  Initial flush of the intravenous catheter was performed was sterile saline. The dose syringe was connected to the catheter and the Lu-177 Pluvicto  administered over a 1 to 10 min infusion. Single 10 cc  lushes with normal saline follow the dose. No complications were noted. The entire IV tubing, venocatheter, stopcock and syringes was removed in total, placed in a disposal bag and sent for assay of the residual activity, which will be reported at a later time in our EMR by the physics staff. Pressure was applied to the venipuncture site, and a compression bandage placed. Patient monitored for 1 hour following infusion.    Radiation Safety personnel were present to perform the discharge survey, as detailed on their documentation. After a short period of observation, the patient had his IV removed.  RADIOPHARMACEUTICALS: [210.2] microcuries Lu-177 PLUVICTO   FINDINGS: Current Infusion: [3]  Planned Infusions: 6    Patient presented to nuclear medicine for treatment. The patient's most recent blood counts were reviewed and remains a good candidate to proceed with Lu-177 Pluvicto .    Patient reports increased energy.  No adverse effects.  Renal function and CBC within normal limits.     Continued decrease in PSA PSA equal 1.1.       The patient was situated in an infusion suite with a contact barrier placed under the arm. Intravenous access was established, using sterile technique, and a normal saline infusion from a syringe was started.     Micro-dosimetry: The prescribed radiation activity was assayed and confirmed to be within  specified tolerance.  IMPRESSION: Current Infusion: [3]  Planned Infusions: 6    [The patient tolerated the infusion well. The patient will return in 6 weeks for ongoing care.]

## 2023-12-08 ENCOUNTER — Other Ambulatory Visit: Payer: Self-pay | Admitting: *Deleted

## 2023-12-08 ENCOUNTER — Other Ambulatory Visit: Payer: Self-pay

## 2023-12-08 DIAGNOSIS — I10 Essential (primary) hypertension: Secondary | ICD-10-CM

## 2023-12-08 MED ORDER — TAMSULOSIN HCL 0.4 MG PO CAPS: ORAL_CAPSULE | ORAL | 1 refills | Status: DC

## 2023-12-30 NOTE — Assessment & Plan Note (Addendum)
 Continue C4 Pluvicto .   History of bilateral orchiectomy Lab and visit on the week of 12/2

## 2023-12-30 NOTE — Assessment & Plan Note (Addendum)
 On crestor.

## 2023-12-30 NOTE — Progress Notes (Unsigned)
 [facility] OFFICE PROGRESS NOTE  Patient Care Team: Elnor Lauraine BRAVO, Ethan [name] as PCP - General (Nurse Practitioner) Ethan [name], Ethan Ned, Ethan [name] as PCP - Cardiology (Cardiology) Ethan Pauletta BROCKS, Ethan [name] as Consulting Physician (Hematology and Oncology) Ethan [name], Ethan [name], Ethan [name] (Optometry)  Lemon is a 73 y.o.male with history of hypertension, bilateral orchiectomy [facility]. 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 He then had bilateral orchiectomy by Dr. Watt on 09/08/2017. He then started AAP in 09/2017. PSA nadir 3.7 in 04/2023.    Current diagnosis: mCRPC Initial diagnosis: mHSPC Germline testing: Negative genetic testing on the multii-cancer panel.  Somatic testing: Guardant360 from 2023. TMB 8.74. MSI high not detected.  No reportable tumor related somatic alteration. Previous Treatment: 09/2017-08/2023 AAP. PSA nadir 3.7 in 04/2023. Post bilateral ochiectomy. Repeat PET in 05/2023 showed persistent widespread osseous metastases. Current treatment: 09/01/2023 - current Pluvicto    Pretreatment PET: Multifocal tracer avid osseous metastasis & eccentric left prostatic primary,  nodal metastasis. (05/25/23). PSA 4.4   No new symptoms.  Clinical tolerating Pluvicto  well.  Will proceed with cycle 4.  Continue to monitor PSA.   Second dose of Zometa  was better tolerated.  No side effects.  Will receive Zometa  in Dec. Assessment & Plan Prostate cancer (HCC) Continue C4 Pluvicto .   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  Last Zometa  in Sept. Next Zometa  in December. Xgeva  from 2019-2024 Routine dental care Continue calcium  (1000-1200 mg daily from food and supplements) and vitamin D3 (1000 IU daily) prevent diabetes Primary hypertension On valsartan  and report BP fine at home  Orders Placed This Encounter  Procedures   CBC with Differential (Cancer Center Only)    Standing Status:   Future    Expiration Date:   01/03/2025   CMP (Cancer Center only)    Standing Status:   Future    Expiration Date:   01/03/2025   PSA    Standing Status:   Future    Expiration Date:   01/03/2025   Testosterone     Standing Status:   Future    Expiration Date:   01/03/2025     Pauletta [name] Tina, Ethan [name]  INTERVAL HISTORY: Patient returns for follow-up.  Overall he is feeling well.  He denies any new bone pain or back pain.  Appetite is good.  He is still walking over 10,000 steps a day.  He denies any new symptoms.  Some dry mouth from treatment as expected.  Some fatigue after 2 to 3 days to resolve on its own.  No other side effects.  He is taking vitamin D  and calcium .  He denies any side effect from Zometa  last time.  Oncology History Overview Note  Negative genetic testing   Prostate cancer (HCC)  08/24/2017 Tumor Marker   PSA 352   08/31/2017 Surgery   Prostate biopsy showed high volume high-grade disease with a Gleason score 4+5 = 9 and multiple cores with a total of at least 11 out of 12 cores involved with cancer.  09/02/2017 Imaging   Bone scan  Multiple sites of abnormal increased tracer localization including midthoracic spine at T5, at LEFT L3, lateral RIGHT fourth rib, LEFT ischium, LEFT inferior pubic ramus and pubic body, and LEFT femoral neck consistent with osseous metastases.   09/08/2017 Surgery   orchiectomy   09/2017 -  Chemotherapy   Started Zytiga  1000 mg daily with prednisone   Also started Xgeva    10/27/2017 Initial Diagnosis    Prostate cancer (HCC)   12/15/2017 Tumor Marker   PSA 18.2   12/26/2017 Genetic Testing   Negative genetic testing on the multii-cancer panel.  The Multi-Gene Panel offered by [facility] includes sequencing and/or deletion duplication testing of the following 84 genes: AIP, ALK, APC, ATM, AXIN2,BAP1,  BARD1, BLM, BMPR1A, BRCA1, BRCA2, BRIP1, CASR, CDC73, CDH1, CDK4, CDKN1B, CDKN1C, CDKN2A (p14ARF), CDKN2A (p16INK4a), CEBPA, CHEK2, CTNNA1, DICER1, DIS3L2, EGFR (c.2369C>T, p.Thr790Met variant only), EPCAM (Deletion/duplication testing only), FH, FLCN, GATA2, GPC3, GREM1 (Promoter region deletion/duplication testing only), HOXB13 (c.251G>A, p.Gly84Glu), HRAS, KIT, MAX, MEN1, MET, MITF (c.952G>A, p.Glu318Lys variant only), MLH1, MSH2, MSH3, MSH6, MUTYH, NBN, NF1, NF2, NTHL1, PALB2, PDGFRA, PHOX2B, PMS2, POLD1, POLE, POT1, PRKAR1A, PTCH1, PTEN, RAD50, RAD51C, RAD51D, RB1, RECQL4, RET, RUNX1, SDHAF2, SDHA (sequence changes only), SDHB, SDHC, SDHD, SMAD4, SMARCA4, SMARCB1, SMARCE1, STK11, SUFU, TERC, TERT, TMEM127, TP53, TSC1, TSC2, VHL, WRN and WT1.  The report date is December 26, 2017.   06/28/2018 Imaging   CT AP 1. Decreased bilateral iliac lymphadenopathy since prior study. 2. Increased sclerosis of several bone metastases in lumbar spine and pelvis, consistent with interval healing/response to therapy. 3. Decreased size of prostate gland. 4. No new or progressive metastatic disease identified.   08/01/2019 Imaging   CT  Musculoskeletal: Sclerotic metastases in the spine and pelvis are unchanged. 1. Interval decrease in size of bilateral iliac chain lymph nodes. Stable osseous metastatic disease. 2.  Aortic atherosclerosis (ICD10-I70.0).   04/16/2021 PET scan   PSMA PET 1. Focal lesion just LEFT of midline within the LEFT lobe of the prostate gland concerning for local prostate cancer recurrence. 2. Intensely radiotracer avid small external and common iliac lymph nodes consistent with local nodal  metastasis in the pelvis. 3. Progression skeletal metastasis with several new lesions in the calvarium, spine, pelvis and proximal femur.   02/26/2022 PET scan   PSMA PET 1. No significant interval change from PSMA PET scan 04/16/2021. 2. Persistent radiotracer avid lesion in the RIGHT lobe of the prostate gland. 3. Persistent intensely radiotracer avid small metastatic iliac lymph nodes. 4. Multifocal intense radiotracer active skeletal metastasis. No change in size or number skeletal lesions. Activity without significant change.   02/2022 Miscellaneous   Decreased Abi to 250 mg with low fat diet.   11/01/2022 Tumor Marker   PSA 3.9   05/05/2023 Cancer Staging   Staging form: Prostate, AJCC 8th Edition - Clinical: Stage IVB (cTX, cN1, pM1b, PSA: 542, Grade Group: 5) - Signed by Ethan Pauletta BROCKS, Ethan [name] on 05/05/2023 Prostate specific antigen (PSA) range: 20 or greater Histologic grading system: 5 grade system   04/2023 Tumor Marker   PSA nadir 3.7   05/25/2023 PET scan   PSMA PET 1. Similar distribution of eccentric left prostatic primary, pelvic nodal, and widespread osseous metastasis. The primary is similar in tracer affinity and the nodal metastasis are similar in size but decreased in tracer affinity. Osseous metastasis are decreased in tracer affinity. 2. A tiny low left periaortic abdominal retroperitoneal node demonstrates new or increased mild tracer affinity. Cannot  exclude disease progression at this site. 3. Incidental findings, including: Coronary artery atherosclerosis. Aortic Atherosclerosis (ICD10-I70.0).      PHYSICAL EXAMINATION: ECOG PERFORMANCE STATUS: 0 - Asymptomatic  Vitals:   01/04/24 0826  BP: 125/76  Pulse: 76  Resp: 18  Temp: (!) 97.4 F (36.3 C)  SpO2: 99%   Filed Weights   01/04/24 0826  Weight: 173 lb 3.2 oz (78.6 kg)    GENERAL: alert, no distress and comfortable SKIN: skin color normal and no jaundice LUNGS: clear to auscultation and no  wheeze or rales with normal breathing effort HEART: regular rate & rhythm  ABDOMEN: abdomen soft, non-tender and nondistended. Musculoskeletal: no edema and no tenderness on palpation   Relevant data reviewed during this visit included labs.  New labs ordered.

## 2023-12-30 NOTE — Assessment & Plan Note (Addendum)
 Supportive Obtain bone mineral density study  Continue calcium (1000-1200 mg daily from food and supplements) and vitamin D3 (1000 IU daily) prevent diabetes Aggressive cardiovascular risk management Weight-bearing exercises as able (30 minutes per day) Limit alcohol consumption and avoid smoking

## 2024-01-04 ENCOUNTER — Inpatient Hospital Stay: Attending: Hematology

## 2024-01-04 ENCOUNTER — Telehealth: Payer: Self-pay | Admitting: *Deleted

## 2024-01-04 ENCOUNTER — Inpatient Hospital Stay

## 2024-01-04 VITALS — BP 125/76 | HR 76 | Temp 97.4°F | Resp 18 | Ht 70.0 in | Wt 173.2 lb

## 2024-01-04 DIAGNOSIS — I10 Essential (primary) hypertension: Secondary | ICD-10-CM | POA: Insufficient documentation

## 2024-01-04 DIAGNOSIS — E785 Hyperlipidemia, unspecified: Secondary | ICD-10-CM | POA: Insufficient documentation

## 2024-01-04 DIAGNOSIS — C7951 Secondary malignant neoplasm of bone: Secondary | ICD-10-CM | POA: Insufficient documentation

## 2024-01-04 DIAGNOSIS — Z9079 Acquired absence of other genital organ(s): Secondary | ICD-10-CM | POA: Insufficient documentation

## 2024-01-04 DIAGNOSIS — C779 Secondary and unspecified malignant neoplasm of lymph node, unspecified: Secondary | ICD-10-CM | POA: Insufficient documentation

## 2024-01-04 DIAGNOSIS — T387X5A Adverse effect of androgens and anabolic congeners, initial encounter: Secondary | ICD-10-CM

## 2024-01-04 DIAGNOSIS — C61 Malignant neoplasm of prostate: Secondary | ICD-10-CM

## 2024-01-04 DIAGNOSIS — M818 Other osteoporosis without current pathological fracture: Secondary | ICD-10-CM | POA: Diagnosis not present

## 2024-01-04 DIAGNOSIS — Z9189 Other specified personal risk factors, not elsewhere classified: Secondary | ICD-10-CM | POA: Diagnosis not present

## 2024-01-04 LAB — CMP (CANCER CENTER ONLY)
ALT: 17 U/L (ref 0–44)
AST: 23 U/L (ref 15–41)
Albumin: 4.7 g/dL (ref 3.5–5.0)
Alkaline Phosphatase: 60 U/L (ref 38–126)
Anion gap: 7 (ref 5–15)
BUN: 18 mg/dL (ref 8–23)
CO2: 27 mmol/L (ref 22–32)
Calcium: 10.3 mg/dL (ref 8.9–10.3)
Chloride: 103 mmol/L (ref 98–111)
Creatinine: 0.84 mg/dL (ref 0.61–1.24)
GFR, Estimated: >60 mL/min (ref >60)
Glucose, Bld: 119 mg/dL — ABNORMAL HIGH (ref 70–99)
Potassium: 3.9 mmol/L (ref 3.5–5.1)
Sodium: 137 mmol/L (ref 135–145)
Total Bilirubin: 1.1 mg/dL (ref 0.0–1.2)
Total Protein: 7.3 g/dL (ref 6.5–8.1)

## 2024-01-04 LAB — CBC WITH DIFFERENTIAL (CANCER CENTER ONLY)
Abs Immature Granulocytes: 0.01 K/uL (ref 0.00–0.07)
Basophils Absolute: 0 K/uL (ref 0.0–0.1)
Basophils Relative: 0 %
Eosinophils Absolute: 0.2 K/uL (ref 0.0–0.5)
Eosinophils Relative: 3 %
HCT: 38 % — ABNORMAL LOW (ref 39.0–52.0)
Hemoglobin: 13.5 g/dL (ref 13.0–17.0)
Immature Granulocytes: 0 %
Lymphocytes Relative: 18 %
Lymphs Abs: 1.3 K/uL (ref 0.7–4.0)
MCH: 31.3 pg (ref 26.0–34.0)
MCHC: 35.5 g/dL (ref 30.0–36.0)
MCV: 88 fL (ref 80.0–100.0)
Monocytes Absolute: 0.6 K/uL (ref 0.1–1.0)
Monocytes Relative: 9 %
Neutro Abs: 4.8 K/uL (ref 1.7–7.7)
Neutrophils Relative %: 70 %
Platelet Count: 223 K/uL (ref 150–400)
RBC: 4.32 MIL/uL (ref 4.22–5.81)
RDW: 12 % (ref 11.5–15.5)
WBC Count: 6.9 K/uL (ref 4.0–10.5)
nRBC: 0 % (ref 0.0–0.2)

## 2024-01-04 LAB — PSA: Prostatic Specific Antigen: 0.74 ng/mL (ref 0.00–4.00)

## 2024-01-04 NOTE — Assessment & Plan Note (Addendum)
 Continue Zometa .  Last Zometa  in Sept. Next Zometa  in December. Xgeva  from 2019-2024 Routine dental care Continue calcium  (1000-1200 mg daily from food and supplements) and vitamin D3 (1000 IU daily) prevent diabetes

## 2024-01-04 NOTE — Telephone Encounter (Signed)
No call

## 2024-01-04 NOTE — Assessment & Plan Note (Addendum)
 On valsartan  and report BP fine at home

## 2024-01-05 LAB — TESTOSTERONE: Testosterone: <3 ng/dL — ABNORMAL LOW (ref 264–916)

## 2024-01-10 ENCOUNTER — Encounter (HOSPITAL_COMMUNITY)
Admission: RE | Admit: 2024-01-10 | Discharge: 2024-01-10 | Disposition: A | Discharge status: Home / Self Care | Source: Ambulatory Visit

## 2024-01-10 DIAGNOSIS — C61 Malignant neoplasm of prostate: Secondary | ICD-10-CM | POA: Diagnosis not present

## 2024-01-10 DIAGNOSIS — C7952 Secondary malignant neoplasm of bone marrow: Secondary | ICD-10-CM | POA: Diagnosis not present

## 2024-01-10 MED ORDER — SODIUM CHLORIDE 0.9 % IV BOLUS
1000.0000 mL | Freq: Once | INTRAVENOUS | Status: DC
Start: 2024-01-10 — End: 2024-01-16

## 2024-01-10 MED ORDER — LUTETIUM LU 177 VIPIVOTIDE TET 1000 MBQ/ML IV SOLN
200.0000 | Freq: Once | INTRAVENOUS | Status: AC
Start: 2024-01-10 — End: 2024-01-10
  Administered 2024-01-10: 202.995 via INTRAVENOUS

## 2024-01-10 MED ORDER — SODIUM CHLORIDE 0.9 % IV BOLUS: 1000.0000 mL | Freq: Once | INTRAVENOUS | Status: DC

## 2024-01-10 NOTE — Written Directive (Addendum)
  PLUVICTO   THERAPY   RADIOPHARMACEUTICAL: Lutetium 177 vipivotide tetraxetan (Pluvicto )     PRESCRIBED DOSE FOR ADMINISTRATION:  200 mCi   ROUTE OFADMINISTRATION:  IV   DIAGNOSIS:  prostate cancer, Prostate cancer    REFERRING PHYSICIAN: Dr. FABIENE Chihuahua   TREATMENT #: 4   ADDITIONAL PHYSICIAN COMMENTS/NOTES: No labs today  AUTHORIZED USER SIGNATURE & TIME STAMP:  Norleen GORMAN Boxer, MD   01/10/24    10:38 AM

## 2024-01-10 NOTE — Progress Notes (Signed)
 EXAM: NUCLEAR MEDICINE PLUVICTO  INJECTION 01/10/2024 04:41:10 PM TECHNIQUE: Patient presented to nuclear medicine for treatment of metastatic prostate cancer. PSMA PET scan 05/25/2023 was reviewed. The patient's most recent labs were reviewed and it was determined to proceed with Lu-177 Pluvicto . Written informed consent was obtained from the patient by Dr. Malva, who also signed the WD for 200 mCi. Written instructions for radiation safety were provided, and the patient was educated on all radiation precautions. Copies of forms were given along with a travel card. The radiopharmaceutical was injected intravenously via the right hand at 1433/FMU CD according to local protocol. The patient was surveyed at 3 ft measuring ~1.01 mR/hr and released. The patient will return in 6 weeks for the next treatment. RADIOPHARMACEUTICAL: 202.995 mCi Lu-177 PLUVICTO  COMPARISON: PSMA PET scan 05/25/2023. CLINICAL HISTORY: Malignant neoplasm of prostate (HCC); Secondary malignant neoplasm of bone and bone marrow; Secondary malignant neoplasm of bone and bone marrow. FINDINGS: Current Infusion: This is treatment number 4. The patient has no adverse reactions or complaints. Patient reports increased energy and recent wood chopping. Recent laboratory results show normal CBC and BMP. PSA continues to decline, with a current PSA of 0.74, a decrease from 1.1. Planned Infusions: 6 IMPRESSION: Treatment 4 of 6 planned Lu-177 Pluvicto  treatments. No adverse reactions or complaints. PSA continues to decline, now 0.74 (previously 1.1). Plan for next infusion in 6 weeks.

## 2024-01-11 ENCOUNTER — Other Ambulatory Visit: Payer: Self-pay | Admitting: Nurse Practitioner

## 2024-01-11 DIAGNOSIS — E785 Hyperlipidemia, unspecified: Secondary | ICD-10-CM

## 2024-02-13 NOTE — Progress Notes (Unsigned)
 [facility] OFFICE PROGRESS NOTE  Patient Care Team: Elnor Lauraine BRAVO, NP as PCP - General (Nurse Practitioner) Barbaraann, Darryle Ned, MD as PCP - Cardiology (Cardiology) Tina Pauletta BROCKS, MD as Consulting Physician (Hematology and Oncology) [place], Ritesh, OD (Optometry)  Ethan [name] is a 73 y.o.male with history of hypertension, bilateral orchiectomy [facility]. 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 He then had bilateral orchiectomy by Dr. Watt on 09/08/2017. He then started AAP in 09/2017. PSA nadir 3.7 in 04/2023.   Current diagnosis: mCRPC Initial diagnosis: mHSPC Germline testing: Negative genetic testing on the multii-cancer panel.  Somatic testing: Guardant360 from 2023. TMB 8.74. MSI high not detected.  No reportable tumor related somatic alteration. Previous Treatment:  09/2017-08/2023 AAP. PSA nadir 3.7 in 04/2023. Post bilateral ochiectomy. Repeat PET in 05/2023 showed persistent widespread osseous metastases. Current treatment: 09/01/2023 - current Pluvicto   Pretreatment PET: Multifocal tracer avid osseous metastasis & eccentric left prostatic primary,  nodal metastasis. (05/25/23). PSA 4.4   No new concerning symptoms.  Clinical tolerating Pluvicto  well.  Dry mouth, taste change from therapy.  This is tolerable.  Will proceed with cycle 5  Continue to monitor PSA.   Second dose of Zometa  was better tolerated.  No side effects.  Will receive Zometa  today.  Patient is doing well when having Flomax  for urination.  Urinary difficulty when discontinued.  Will refill today. Assessment & Plan Prostate cancer [facility]) Completed C4 Pluvicto .   C5 on 12/9 Plan on restaging PET after Pluvicto  History of bilateral orchiectomy Lab and visit on the week of 1/12 at 9 with lab before visit. 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 Continue Zometa .  Will receive today Next Zometa  in March. Xgeva   from 2019-2024 Routine dental care Continue calcium  (1000-1200 mg daily from food and supplements) and vitamin D3 (1000 IU daily) prevent diabetes Primary hypertension On valsartan  and report BP fine at home Dyslipidemia, goal LDL below 100 On crestor   At risk for side effect of medication Supportive bone mineral density study from 07/2023 showed Osteoporosis  Continue calcium  (1000-1200 mg daily from food and supplements) and vitamin D3 (1000 IU daily) prevent diabetes Aggressive cardiovascular risk management Weight-bearing exercises as able (30 minutes per day) Limit alcohol consumption and avoid smoking  Orders Placed This Encounter  Procedures   CBC with Differential (Cancer Center Only)    Standing Status:   Future    Expiration Date:   02/13/2025   CMP (Cancer Center only)    Standing Status:   Future    Expiration Date:   02/13/2025   PSA    Standing Status:   Future    Expiration Date:   02/13/2025   Testosterone     Standing Status:   Future    Expiration Date:   02/13/2025   Lipid panel    Standing Status:   Future    Expiration Date:   02/13/2025     Pauletta BROCKS Tina, MD  INTERVAL HISTORY: Patient returns for follow-up.  Overall feeling well.  Some loss of taste, dry mouth as expected.  Fatigue for about a week and resolved.  Able to do things he wanted do, walking about 5 miles a day.  No new bone pain or back pain.  Appetite is good.  Self stopped Flomax  and resulted in some more difficulty with urination.  After resuming, no trouble with urination.  Oncology History Overview  Note  Negative genetic testing   Prostate cancer (HCC)  08/24/2017 Tumor Marker   PSA 352   08/31/2017 Surgery   Prostate biopsy showed high volume high-grade disease with a Gleason score 4+5 = 9 and multiple cores with a total of at least 11 out of 12 cores involved with cancer.    09/02/2017 Imaging   Bone scan  Multiple sites of abnormal increased tracer localization including  midthoracic spine at T5, at LEFT L3, lateral RIGHT fourth rib, LEFT ischium, LEFT inferior pubic ramus and pubic body, and LEFT femoral neck consistent with osseous metastases.   09/08/2017 Surgery   orchiectomy   09/2017 -  Chemotherapy   Started Zytiga  1000 mg daily with prednisone   Also started Xgeva    10/27/2017 Initial Diagnosis   Prostate cancer (HCC)   12/15/2017 Tumor Marker   PSA 18.2   12/26/2017 Genetic Testing   Negative genetic testing on the multii-cancer panel.  The Multi-Gene Panel offered by [facility] includes sequencing and/or deletion duplication testing of the following 84 genes: AIP, ALK, APC, ATM, AXIN2,BAP1,  BARD1, BLM, BMPR1A, BRCA1, BRCA2, BRIP1, CASR, CDC73, CDH1, CDK4, CDKN1B, CDKN1C, CDKN2A (p14ARF), CDKN2A (p16INK4a), CEBPA, CHEK2, CTNNA1, DICER1, DIS3L2, EGFR (c.2369C>T, p.Thr790Met variant only), EPCAM (Deletion/duplication testing only), FH, FLCN, GATA2, GPC3, GREM1 (Promoter region deletion/duplication testing only), HOXB13 (c.251G>A, p.Gly84Glu), HRAS, KIT, MAX, MEN1, MET, MITF (c.952G>A, p.Glu318Lys variant only), MLH1, MSH2, MSH3, MSH6, MUTYH, NBN, NF1, NF2, NTHL1, PALB2, PDGFRA, PHOX2B, PMS2, POLD1, POLE, POT1, PRKAR1A, PTCH1, PTEN, RAD50, RAD51C, RAD51D, RB1, RECQL4, RET, RUNX1, SDHAF2, SDHA (sequence changes only), SDHB, SDHC, SDHD, SMAD4, SMARCA4, SMARCB1, SMARCE1, STK11, SUFU, TERC, TERT, TMEM127, TP53, TSC1, TSC2, VHL, WRN and WT1.  The report date is December 26, 2017.   06/28/2018 Imaging   CT AP 1. Decreased bilateral iliac lymphadenopathy since prior study. 2. Increased sclerosis of several bone metastases in lumbar spine and pelvis, consistent with interval healing/response to therapy. 3. Decreased size of prostate gland. 4. No new or progressive metastatic disease identified.   08/01/2019 Imaging   CT  Musculoskeletal: Sclerotic metastases in the spine and pelvis are unchanged. 1. Interval decrease in size of bilateral iliac chain lymph nodes.  Stable osseous metastatic disease. 2.  Aortic atherosclerosis (ICD10-I70.0).   04/16/2021 PET scan   PSMA PET 1. Focal lesion just LEFT of midline within the LEFT lobe of the prostate gland concerning for local prostate cancer recurrence. 2. Intensely radiotracer avid small external and common iliac lymph nodes consistent with local nodal metastasis in the pelvis. 3. Progression skeletal metastasis with several new lesions in the calvarium, spine, pelvis and proximal femur.   02/26/2022 PET scan   PSMA PET 1. No significant interval change from PSMA PET scan 04/16/2021. 2. Persistent radiotracer avid lesion in the RIGHT lobe of the prostate gland. 3. Persistent intensely radiotracer avid small metastatic iliac lymph nodes. 4. Multifocal intense radiotracer active skeletal metastasis. No change in size or number skeletal lesions. Activity without significant change.   02/2022 Miscellaneous   Decreased Abi to 250 mg with low fat diet.   11/01/2022 Tumor Marker   PSA 3.9   05/05/2023 Cancer Staging   Staging form: Prostate, AJCC 8th Edition - Clinical: Stage IVB (cTX, cN1, pM1b, PSA: 542, Grade Group: 5) - Signed by Tina Pauletta BROCKS, MD on 05/05/2023 Prostate specific antigen (PSA) range: 20 or greater Histologic grading system: 5 grade system   04/2023 Tumor Marker   PSA nadir 3.7   05/25/2023 PET scan   PSMA PET  1. Similar distribution of eccentric left prostatic primary, pelvic nodal, and widespread osseous metastasis. The primary is similar in tracer affinity and the nodal metastasis are similar in size but decreased in tracer affinity. Osseous metastasis are decreased in tracer affinity. 2. A tiny low left periaortic abdominal retroperitoneal node demonstrates new or increased mild tracer affinity. Cannot exclude disease progression at this site. 3. Incidental findings, including: Coronary artery atherosclerosis. Aortic Atherosclerosis (ICD10-I70.0).      PHYSICAL  EXAMINATION: ECOG PERFORMANCE STATUS: 1  Vitals:   02/14/24 0815  BP: 116/73  Pulse: 76  Resp: 18  Temp: (!) 97.3 F (36.3 C)  SpO2: 99%   Filed Weights   02/14/24 0815  Weight: 176 lb 14.4 oz (80.2 kg)    GENERAL: alert, no distress and comfortable SKIN: skin color normal and no jaundice  EYES: normal, sclera clear LUNGS: clear to auscultation and no wheeze or rales with normal breathing effort HEART: regular rate & rhythm  ABDOMEN: abdomen soft, non-tender and nondistended. Musculoskeletal: no point tenderness   Relevant data reviewed during this visit included labs.  New labs ordered.

## 2024-02-13 NOTE — Assessment & Plan Note (Signed)
 Completed C4 Pluvicto .   C5 on 12/9 Plan on restaging PET after Pluvicto  History of bilateral orchiectomy Lab and visit on the week of 1/12 at 9 with lab before visit.

## 2024-02-14 ENCOUNTER — Inpatient Hospital Stay

## 2024-02-14 ENCOUNTER — Inpatient Hospital Stay: Attending: Hematology

## 2024-02-14 VITALS — BP 116/73 | HR 76 | Temp 97.3°F | Resp 18 | Wt 176.9 lb

## 2024-02-14 DIAGNOSIS — C7951 Secondary malignant neoplasm of bone: Secondary | ICD-10-CM | POA: Diagnosis present

## 2024-02-14 DIAGNOSIS — E785 Hyperlipidemia, unspecified: Secondary | ICD-10-CM

## 2024-02-14 DIAGNOSIS — I10 Essential (primary) hypertension: Secondary | ICD-10-CM | POA: Diagnosis not present

## 2024-02-14 DIAGNOSIS — M818 Other osteoporosis without current pathological fracture: Secondary | ICD-10-CM

## 2024-02-14 DIAGNOSIS — Z9079 Acquired absence of other genital organ(s): Secondary | ICD-10-CM | POA: Insufficient documentation

## 2024-02-14 DIAGNOSIS — Z79818 Long term (current) use of other agents affecting estrogen receptors and estrogen levels: Secondary | ICD-10-CM | POA: Diagnosis not present

## 2024-02-14 DIAGNOSIS — C61 Malignant neoplasm of prostate: Secondary | ICD-10-CM

## 2024-02-14 DIAGNOSIS — T387X5A Adverse effect of androgens and anabolic congeners, initial encounter: Secondary | ICD-10-CM

## 2024-02-14 DIAGNOSIS — Z9189 Other specified personal risk factors, not elsewhere classified: Secondary | ICD-10-CM

## 2024-02-14 LAB — CBC WITH DIFFERENTIAL (CANCER CENTER ONLY)
Abs Immature Granulocytes: 0.01 K/uL (ref 0.00–0.07)
Basophils Absolute: 0 K/uL (ref 0.0–0.1)
Basophils Relative: 1 %
Eosinophils Absolute: 0.3 K/uL (ref 0.0–0.5)
Eosinophils Relative: 5 %
HCT: 36.6 % — ABNORMAL LOW (ref 39.0–52.0)
Hemoglobin: 13.2 g/dL (ref 13.0–17.0)
Immature Granulocytes: 0 %
Lymphocytes Relative: 22 %
Lymphs Abs: 1.3 K/uL (ref 0.7–4.0)
MCH: 31.9 pg (ref 26.0–34.0)
MCHC: 36.1 g/dL — ABNORMAL HIGH (ref 30.0–36.0)
MCV: 88.4 fL (ref 80.0–100.0)
Monocytes Absolute: 0.6 K/uL (ref 0.1–1.0)
Monocytes Relative: 11 %
Neutro Abs: 3.7 K/uL (ref 1.7–7.7)
Neutrophils Relative %: 61 %
Platelet Count: 209 K/uL (ref 150–400)
RBC: 4.14 MIL/uL — ABNORMAL LOW (ref 4.22–5.81)
RDW: 12 % (ref 11.5–15.5)
WBC Count: 6 K/uL (ref 4.0–10.5)
nRBC: 0 % (ref 0.0–0.2)

## 2024-02-14 LAB — CMP (CANCER CENTER ONLY)
ALT: 17 U/L (ref 0–44)
AST: 25 U/L (ref 15–41)
Albumin: 4.6 g/dL (ref 3.5–5.0)
Alkaline Phosphatase: 65 U/L (ref 38–126)
Anion gap: 14 (ref 5–15)
BUN: 27 mg/dL — ABNORMAL HIGH (ref 8–23)
CO2: 21 mmol/L — ABNORMAL LOW (ref 22–32)
Calcium: 10.2 mg/dL (ref 8.9–10.3)
Chloride: 103 mmol/L (ref 98–111)
Creatinine: 0.97 mg/dL (ref 0.61–1.24)
GFR, Estimated: >60 mL/min (ref >60)
Glucose, Bld: 114 mg/dL — ABNORMAL HIGH (ref 70–99)
Potassium: 4.1 mmol/L (ref 3.5–5.1)
Sodium: 138 mmol/L (ref 135–145)
Total Bilirubin: 0.8 mg/dL (ref 0.0–1.2)
Total Protein: 7.1 g/dL (ref 6.5–8.1)

## 2024-02-14 LAB — PSA: Prostatic Specific Antigen: 0.48 ng/mL (ref 0.00–4.00)

## 2024-02-14 MED ORDER — ZOLEDRONIC ACID 4 MG/100ML IV SOLN
4.0000 mg | Freq: Once | INTRAVENOUS | Status: AC
  Administered 2024-02-14: 4 mg via INTRAVENOUS
  Filled 2024-02-14: qty 100

## 2024-02-14 MED ORDER — TAMSULOSIN HCL 0.4 MG PO CAPS: ORAL_CAPSULE | ORAL | 3 refills | Status: AC

## 2024-02-14 NOTE — Assessment & Plan Note (Addendum)
 On valsartan  and report BP fine at home

## 2024-02-14 NOTE — Assessment & Plan Note (Addendum)
 No dental issues. Continue Zometa .  Will receive today Next Zometa  in March. Xgeva  from 2019-2024 Routine dental care Continue calcium  (1000-1200 mg daily from food and supplements) and vitamin D3 (1000 IU daily) prevent diabetes

## 2024-02-14 NOTE — Patient Instructions (Signed)

## 2024-02-14 NOTE — Assessment & Plan Note (Addendum)
 Supportive bone mineral density study from 07/2023 showed Osteoporosis  Continue calcium  (1000-1200 mg daily from food and supplements) and vitamin D3 (1000 IU daily) prevent diabetes Aggressive cardiovascular risk management Weight-bearing exercises as able (30 minutes per day) Limit alcohol consumption and avoid smoking

## 2024-02-14 NOTE — Assessment & Plan Note (Addendum)
 On crestor.

## 2024-02-15 LAB — TESTOSTERONE: Testosterone: <3 ng/dL — ABNORMAL LOW (ref 264–916)

## 2024-02-16 NOTE — Written Directive (Addendum)
  PLUVICTO   THERAPY   RADIOPHARMACEUTICAL: Lutetium 177 vipivotide tetraxetan (Pluvicto )     PRESCRIBED DOSE FOR ADMINISTRATION:  200 mCi   ROUTE OFADMINISTRATION:  IV   DIAGNOSIS:  Prostate Cancer    REFERRING PHYSICIAN: Dr Tina    TREATMENT #: 5    ADDITIONAL PHYSICIAN COMMENTS/NOTES:   AUTHORIZED USER SIGNATURE & TIME STAMP: Norleen GORMAN Boxer, MD   02/21/24    2:31 PM

## 2024-02-21 ENCOUNTER — Inpatient Hospital Stay (HOSPITAL_COMMUNITY): Admission: RE | Admit: 2024-02-21 | Discharge: 2024-02-21 | Disposition: A | Source: Ambulatory Visit

## 2024-02-21 DIAGNOSIS — C61 Malignant neoplasm of prostate: Secondary | ICD-10-CM

## 2024-02-21 MED ORDER — LUTETIUM LU 177 VIPIVOTIDE TET 1000 MBQ/ML IV SOLN
202.9000 | Freq: Once | INTRAVENOUS | Status: AC
  Administered 2024-02-21: 202.9 via INTRAVENOUS

## 2024-02-21 MED ORDER — SODIUM CHLORIDE 0.9 % IV BOLUS: 1000.0000 mL | Freq: Once | INTRAVENOUS | Status: DC

## 2024-02-21 NOTE — Progress Notes (Signed)
 EXAM: NUCLEAR MEDICINE PLUVICTO  INJECTION 02/21/2024 04:51:12 PM TECHNIQUE: Patient presented to nuclear medicine for treatment of metastatic prostate cancer. PSMA PET scan was reviewed. The patient's most recent labs were reviewed and it was determined to proceed with Lu-177 Pluvicto . Written informed consent was obtained from the patient. Written instructions for radiation safety were provided. The radiopharmaceutical was injected intravenously according to local protocol. RADIOPHARMACEUTICAL: 202.9 mCi Lu-177 PLUVICTO  COMPARISON: None available. CLINICAL HISTORY: prostate cancer FINDINGS: Current Infusion: 5 Planned Infusions: 6 Patient reports interval gain of energy, best felt in years. CBC and renal function are normal. PSA decreased to 0.48 from 4.8 prior to therapy. IMPRESSION: Fifth of six planned Lu-177 Pluvicto  treatments completed without complication. Interval improvement in energy reported by the patient. Complete blood count and renal function within normal limits. PSA decreased to 0.48 from 4.8 prior to therapy. Plan to proceed with the next infusion in 6 weeks.

## 2024-03-16 ENCOUNTER — Other Ambulatory Visit: Payer: Self-pay

## 2024-03-16 DIAGNOSIS — G4700 Insomnia, unspecified: Secondary | ICD-10-CM

## 2024-03-21 ENCOUNTER — Other Ambulatory Visit

## 2024-03-25 NOTE — Assessment & Plan Note (Addendum)
 Supportive bone mineral density study from 07/2023 showed Osteoporosis  Continue calcium  (1000-1200 mg daily from food and supplements) and vitamin D3 (1000 IU daily) prevent diabetes Aggressive cardiovascular risk management Weight-bearing exercises as able (30 minutes per day) Limit alcohol consumption and avoid smoking

## 2024-03-25 NOTE — Assessment & Plan Note (Addendum)
 No dental issues. Continue Zometa .  Next Zometa  in March. Xgeva  from 2019-2024 Routine dental care Continue calcium  (1000-1200 mg daily from food and supplements) and vitamin D3 (1000 IU daily) prevent diabetes

## 2024-03-25 NOTE — Progress Notes (Unsigned)
 [facility] OFFICE PROGRESS NOTE  Patient Care Team: Elnor Lauraine BRAVO, NP as PCP - General (Nurse Practitioner) Barbaraann, Darryle Ned, MD as PCP - Cardiology (Cardiology) Tina Pauletta BROCKS, MD as Consulting Physician (Hematology and Oncology) [place], Ritesh, OD (Optometry)  Ethan [name] is a 74 y.o.male with history of hypertension, bilateral orchiectomy [facility] cancer. 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 He then had bilateral orchiectomy by Dr. Watt on 09/08/2017. He then started AAP in 09/2017. PSA nadir 3.7 in 04/2023.    Current diagnosis: mCRPC Initial diagnosis: mHSPC Germline testing: Negative genetic testing on the multii-cancer panel.  Somatic testing: Guardant360 from 2023. TMB 8.74. MSI high not detected.  No reportable tumor related somatic alteration. Previous Treatment:  09/2017-08/2023 AAP. PSA nadir 3.7 in 04/2023. Post bilateral ochiectomy. Repeat PET in 05/2023 showed persistent widespread osseous metastases. Current treatment: 09/01/2023 - current Pluvicto    Pretreatment PET: Multifocal tracer avid osseous metastasis & eccentric left prostatic primary,  nodal metastasis. (05/25/23). PSA 4.4   No new concerning symptoms.  Clinical tolerating Pluvicto  well.  Dry mouth, taste change from therapy.  This is tolerable.  Will proceed with cycle 6  Continue to monitor PSA.  Assessment & Plan Prostate cancer [facility]) Completed C5 Pluvicto .   C6 on 1/20 Plan on restaging PET after Pluvicto  History of bilateral orchiectomy Lab and visit on the week of 3/20 at 9 with lab before visit. 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 Continue Zometa .  Next Zometa  in March. Xgeva  from 2019-2024 Routine dental care Continue calcium  (1000-1200 mg daily from food and supplements) and vitamin D3 (1000 IU daily) prevent diabetes  No orders of the defined types were placed in this encounter.    Pauletta BROCKS Tina, MD  INTERVAL HISTORY: Patient returns for follow-up.  Oncology History Overview Note  Negative genetic testing   Prostate cancer (HCC)  08/24/2017 Tumor Marker   PSA 352   08/31/2017 Surgery   Prostate biopsy showed high volume high-grade disease with a Gleason score 4+5 = 9 and multiple cores with a total of at least 11 out of 12 cores involved with cancer.    09/02/2017 Imaging   Bone scan  Multiple sites of abnormal increased tracer localization including midthoracic spine at T5, at LEFT L3, lateral RIGHT fourth rib, LEFT ischium, LEFT inferior pubic ramus and pubic body, and LEFT femoral neck consistent with osseous metastases.   09/08/2017 Surgery   orchiectomy   09/2017 -  Chemotherapy   Started Zytiga  1000 mg daily with prednisone   Also started Xgeva    10/27/2017 Initial Diagnosis   Prostate cancer (HCC)   12/15/2017 Tumor Marker   PSA 18.2   12/26/2017 Genetic Testing   Negative genetic testing on the multii-cancer panel.  The Multi-Gene Panel offered by [facility] includes sequencing and/or deletion duplication testing of the following 84 genes: AIP, ALK, APC, ATM, AXIN2,BAP1,  BARD1, BLM, BMPR1A, BRCA1, BRCA2, BRIP1, CASR, CDC73, CDH1, CDK4, CDKN1B, CDKN1C, CDKN2A (p14ARF), CDKN2A (p16INK4a), CEBPA, CHEK2, CTNNA1, DICER1, DIS3L2, EGFR (c.2369C>T, p.Thr790Met variant only), EPCAM (Deletion/duplication testing only), FH, FLCN, GATA2, GPC3, GREM1 (Promoter region  deletion/duplication testing only), HOXB13 (c.251G>A, p.Gly84Glu), HRAS, KIT, MAX, MEN1, MET, MITF (c.952G>A, p.Glu318Lys variant only), MLH1, MSH2,  MSH3, MSH6, MUTYH, NBN, NF1, NF2, NTHL1, PALB2, PDGFRA, PHOX2B, PMS2, POLD1, POLE, POT1, PRKAR1A, PTCH1, PTEN, RAD50, RAD51C, RAD51D, RB1, RECQL4, RET, RUNX1, SDHAF2, SDHA (sequence changes only), SDHB, SDHC, SDHD, SMAD4, SMARCA4, SMARCB1, SMARCE1, STK11, SUFU, TERC, TERT, TMEM127, TP53, TSC1, TSC2, VHL, WRN and WT1.  The report date is December 26, 2017.   06/28/2018 Imaging   CT AP 1. Decreased bilateral iliac lymphadenopathy since prior study. 2. Increased sclerosis of several bone metastases in lumbar spine and pelvis, consistent with interval healing/response to therapy. 3. Decreased size of prostate gland. 4. No new or progressive metastatic disease identified.   08/01/2019 Imaging   CT  Musculoskeletal: Sclerotic metastases in the spine and pelvis are unchanged. 1. Interval decrease in size of bilateral iliac chain lymph nodes. Stable osseous metastatic disease. 2.  Aortic atherosclerosis (ICD10-I70.0).   04/16/2021 PET scan   PSMA PET 1. Focal lesion just LEFT of midline within the LEFT lobe of the prostate gland concerning for local prostate cancer recurrence. 2. Intensely radiotracer avid small external and common iliac lymph nodes consistent with local nodal metastasis in the pelvis. 3. Progression skeletal metastasis with several new lesions in the calvarium, spine, pelvis and proximal femur.   02/26/2022 PET scan   PSMA PET 1. No significant interval change from PSMA PET scan 04/16/2021. 2. Persistent radiotracer avid lesion in the RIGHT lobe of the prostate gland. 3. Persistent intensely radiotracer avid small metastatic iliac lymph nodes. 4. Multifocal intense radiotracer active skeletal metastasis. No change in size or number skeletal lesions. Activity without significant change.   02/2022 Miscellaneous   Decreased Abi to 250 mg with low fat diet.   11/01/2022 Tumor Marker   PSA 3.9   05/05/2023 Cancer Staging   Staging form: Prostate, AJCC 8th Edition - Clinical:  Stage IVB (cTX, cN1, pM1b, PSA: 542, Grade Group: 5) - Signed by Tina Pauletta BROCKS, MD on 05/05/2023 Prostate specific antigen (PSA) range: 20 or greater Histologic grading system: 5 grade system   04/2023 Tumor Marker   PSA nadir 3.7   05/25/2023 PET scan   PSMA PET 1. Similar distribution of eccentric left prostatic primary, pelvic nodal, and widespread osseous metastasis. The primary is similar in tracer affinity and the nodal metastasis are similar in size but decreased in tracer affinity. Osseous metastasis are decreased in tracer affinity. 2. A tiny low left periaortic abdominal retroperitoneal node demonstrates new or increased mild tracer affinity. Cannot exclude disease progression at this site. 3. Incidental findings, including: Coronary artery atherosclerosis. Aortic Atherosclerosis (ICD10-I70.0).      PHYSICAL EXAMINATION: ECOG PERFORMANCE STATUS: {CHL ONC ECOG PS:[id number]}  There were no vitals filed for this visit. There were no vitals filed for this visit.  GENERAL: alert, no distress and comfortable SKIN: skin color normal and no jaundice or bruising or petechiae on exposed skin EYES: normal, sclera clear OROPHARYNX: no exudate  NECK: No palpable mass LYMPH:  no palpable cervical, axillary lymphadenopathy  LUNGS: clear to auscultation and no wheeze or rales with normal breathing effort HEART: regular rate & rhythm  ABDOMEN: abdomen soft, non-tender and nondistended. Musculoskeletal: no edema NEURO: no focal motor/sensory deficits  Relevant data reviewed during this visit included labs.  New labs ordered.

## 2024-03-25 NOTE — Assessment & Plan Note (Addendum)
 Completed C5 Pluvicto .   C6 on 1/20 Plan on restaging PET after Pluvicto  History of bilateral orchiectomy Lab and visit on the week of 3/20 at 9 with lab before visit.

## 2024-03-26 ENCOUNTER — Inpatient Hospital Stay

## 2024-03-26 ENCOUNTER — Inpatient Hospital Stay: Attending: Hematology

## 2024-03-26 VITALS — BP 123/84 | HR 80 | Temp 97.7°F | Resp 18 | Wt 174.0 lb

## 2024-03-26 DIAGNOSIS — Z9189 Other specified personal risk factors, not elsewhere classified: Secondary | ICD-10-CM

## 2024-03-26 DIAGNOSIS — E785 Hyperlipidemia, unspecified: Secondary | ICD-10-CM

## 2024-03-26 DIAGNOSIS — T387X5A Adverse effect of androgens and anabolic congeners, initial encounter: Secondary | ICD-10-CM | POA: Diagnosis not present

## 2024-03-26 DIAGNOSIS — C61 Malignant neoplasm of prostate: Secondary | ICD-10-CM | POA: Diagnosis not present

## 2024-03-26 DIAGNOSIS — M818 Other osteoporosis without current pathological fracture: Secondary | ICD-10-CM | POA: Diagnosis not present

## 2024-03-26 LAB — CBC WITH DIFFERENTIAL (CANCER CENTER ONLY)
Abs Immature Granulocytes: 0.02 K/uL (ref 0.00–0.07)
Basophils Absolute: 0 K/uL (ref 0.0–0.1)
Basophils Relative: 0 %
Eosinophils Absolute: 0.2 K/uL (ref 0.0–0.5)
Eosinophils Relative: 3 %
HCT: 36.7 % — ABNORMAL LOW (ref 39.0–52.0)
Hemoglobin: 13.2 g/dL (ref 13.0–17.0)
Immature Granulocytes: 0 %
Lymphocytes Relative: 17 %
Lymphs Abs: 1 K/uL (ref 0.7–4.0)
MCH: 32 pg (ref 26.0–34.0)
MCHC: 36 g/dL (ref 30.0–36.0)
MCV: 89.1 fL (ref 80.0–100.0)
Monocytes Absolute: 0.6 K/uL (ref 0.1–1.0)
Monocytes Relative: 10 %
Neutro Abs: 4.2 K/uL (ref 1.7–7.7)
Neutrophils Relative %: 70 %
Platelet Count: 204 K/uL (ref 150–400)
RBC: 4.12 MIL/uL — ABNORMAL LOW (ref 4.22–5.81)
RDW: 11.7 % (ref 11.5–15.5)
WBC Count: 6.1 K/uL (ref 4.0–10.5)
nRBC: 0 % (ref 0.0–0.2)

## 2024-03-26 LAB — CMP (CANCER CENTER ONLY)
ALT: 17 U/L (ref 0–44)
AST: 25 U/L (ref 15–41)
Albumin: 4.8 g/dL (ref 3.5–5.0)
Alkaline Phosphatase: 64 U/L (ref 38–126)
Anion gap: 13 (ref 5–15)
BUN: 16 mg/dL (ref 8–23)
CO2: 23 mmol/L (ref 22–32)
Calcium: 10.2 mg/dL (ref 8.9–10.3)
Chloride: 102 mmol/L (ref 98–111)
Creatinine: 0.88 mg/dL (ref 0.61–1.24)
GFR, Estimated: >60 mL/min
Glucose, Bld: 115 mg/dL — ABNORMAL HIGH (ref 70–99)
Potassium: 4.3 mmol/L (ref 3.5–5.1)
Sodium: 137 mmol/L (ref 135–145)
Total Bilirubin: 1 mg/dL (ref 0.0–1.2)
Total Protein: 7.5 g/dL (ref 6.5–8.1)

## 2024-03-26 LAB — LIPID PANEL
Cholesterol: 122 mg/dL (ref 0–200)
HDL: 58 mg/dL
LDL Cholesterol: 49 mg/dL (ref 0–99)
Total CHOL/HDL Ratio: 2.1 ratio
Triglycerides: 74 mg/dL
VLDL: 15 mg/dL (ref 0–40)

## 2024-03-26 LAB — PSA: Prostatic Specific Antigen: 0.44 ng/mL (ref 0.00–4.00)

## 2024-03-26 NOTE — Assessment & Plan Note (Addendum)
 On crestor   Lipid panel obtained today.

## 2024-03-27 LAB — TESTOSTERONE: Testosterone: <3 ng/dL — ABNORMAL LOW (ref 264–916)

## 2024-04-03 ENCOUNTER — Ambulatory Visit (HOSPITAL_COMMUNITY)
Admission: RE | Admit: 2024-04-03 | Discharge: 2024-04-03 | Disposition: A | Discharge status: Home / Self Care | Source: Ambulatory Visit

## 2024-04-03 DIAGNOSIS — C61 Malignant neoplasm of prostate: Secondary | ICD-10-CM | POA: Insufficient documentation

## 2024-04-03 MED ORDER — SODIUM CHLORIDE 0.9 % IV SOLN: INTRAVENOUS | Status: AC

## 2024-04-03 MED ORDER — LUTETIUM LU 177 VIPIVOTIDE TET 1000 MBQ/ML IV SOLN
194.3100 | Freq: Once | INTRAVENOUS | Status: AC
  Administered 2024-04-03: 194.31 via INTRAVENOUS

## 2024-04-03 MED ORDER — SODIUM CHLORIDE 0.9 % IV BOLUS: 1000.0000 mL | Freq: Once | INTRAVENOUS | Status: DC

## 2024-04-03 NOTE — Written Directive (Addendum)
" °  PLUVICTO   THERAPY   RADIOPHARMACEUTICAL: Lutetium 177 vipivotide tetraxetan (Pluvicto )     PRESCRIBED DOSE FOR ADMINISTRATION:  200 mCi   ROUTE OFADMINISTRATION:  IV   DIAGNOSIS:  Prostate Cancer    REFERRING PHYSICIAN: Dr Tina    TREATMENT #: 6    ADDITIONAL PHYSICIAN COMMENTS/NOTES:   AUTHORIZED USER SIGNATURE & TIME STAMP: Norleen GORMAN Boxer, MD   04/03/24    1:45 PM   "

## 2024-04-03 NOTE — Progress Notes (Signed)
 EXAM: NUCLEAR MEDICINE PLUVICTO  INJECTION 04/03/2024 03:57:32 PM TECHNIQUE: Patient presented to nuclear medicine for treatment of metastatic prostate cancer. PSMA PET scan was reviewed. The patient's most recent labs (CBC and CMP) were reviewed and were normal. PSA = 0.44. It was determined to proceed with Lu-177 Pluvicto . Written informed consent was obtained from the patient. Written instructions for radiation safety were provided. The radiopharmaceutical was injected intravenously according to local protocol. RADIOPHARMACEUTICAL: 194 mCi Lu-177 PLUVICTO  COMPARISON: None available. CLINICAL HISTORY: prostate cancer FINDINGS: Current Infusion: #6 treatment of #6. Planned Infusions: 6 No adverse effects. Mild dry mouth. IMPRESSION: Lu-177 Pluvicto  treatment 6 of 6 was administered. The patient tolerated the infusion well, with mild dry mouth as the only adverse effect.

## 2024-04-12 ENCOUNTER — Other Ambulatory Visit: Payer: Self-pay

## 2024-06-01 ENCOUNTER — Inpatient Hospital Stay

## 2024-06-08 ENCOUNTER — Inpatient Hospital Stay

## 2024-07-13 ENCOUNTER — Encounter: Admitting: Nurse Practitioner

## 2024-11-15 ENCOUNTER — Ambulatory Visit
